# Patient Record
Sex: Female | Born: 1955 | Race: White | Hispanic: No | Marital: Married | Smoking: Never smoker
Health system: Southern US, Community
[De-identification: ages and names within clinical notes are randomized; demographics above are authoritative.]

## PROBLEM LIST (undated history)

## (undated) DIAGNOSIS — Z9989 Dependence on other enabling machines and devices: Secondary | ICD-10-CM

## (undated) DIAGNOSIS — F419 Anxiety disorder, unspecified: Secondary | ICD-10-CM

## (undated) DIAGNOSIS — M109 Gout, unspecified: Secondary | ICD-10-CM

## (undated) DIAGNOSIS — E781 Pure hyperglyceridemia: Secondary | ICD-10-CM

## (undated) DIAGNOSIS — K635 Polyp of colon: Secondary | ICD-10-CM

## (undated) DIAGNOSIS — M7501 Adhesive capsulitis of right shoulder: Secondary | ICD-10-CM

## (undated) DIAGNOSIS — E119 Type 2 diabetes mellitus without complications: Secondary | ICD-10-CM

## (undated) DIAGNOSIS — G4733 Obstructive sleep apnea (adult) (pediatric): Secondary | ICD-10-CM

## (undated) DIAGNOSIS — D649 Anemia, unspecified: Secondary | ICD-10-CM

## (undated) HISTORY — PX: EYE SURGERY: SHX253

## (undated) HISTORY — DX: Pure hyperglyceridemia: E78.1

## (undated) HISTORY — DX: Gout, unspecified: M10.9

## (undated) HISTORY — PX: BREAST SURGERY: SHX581

## (undated) HISTORY — DX: Polyp of colon: K63.5

## (undated) HISTORY — PX: ACHILLES TENDON SURGERY: SHX542

## (undated) HISTORY — DX: Anxiety disorder, unspecified: F41.9

## (undated) HISTORY — DX: Dependence on other enabling machines and devices: Z99.89

## (undated) HISTORY — DX: Anemia, unspecified: D64.9

## (undated) HISTORY — PX: ABDOMINAL HYSTERECTOMY: SHX81

## (undated) HISTORY — DX: Type 2 diabetes mellitus without complications: E11.9

## (undated) HISTORY — DX: Obstructive sleep apnea (adult) (pediatric): G47.33

---

## 1997-02-09 HISTORY — PX: OTHER SURGICAL HISTORY: SHX169

## 1997-02-09 HISTORY — PX: OVARY SURGERY: SHX727

## 1997-11-26 ENCOUNTER — Ambulatory Visit (HOSPITAL_COMMUNITY): Admission: RE | Admit: 1997-11-26 | Discharge: 1997-11-26 | Payer: Self-pay | Admitting: *Deleted

## 1997-11-29 ENCOUNTER — Ambulatory Visit (HOSPITAL_COMMUNITY): Admission: RE | Admit: 1997-11-29 | Discharge: 1997-11-29 | Payer: Self-pay | Admitting: *Deleted

## 1997-12-17 ENCOUNTER — Ambulatory Visit (HOSPITAL_COMMUNITY): Admission: RE | Admit: 1997-12-17 | Discharge: 1997-12-17 | Payer: Self-pay | Admitting: *Deleted

## 1998-02-28 ENCOUNTER — Ambulatory Visit (HOSPITAL_COMMUNITY): Admission: RE | Admit: 1998-02-28 | Discharge: 1998-02-28 | Payer: Self-pay | Admitting: Neurosurgery

## 1998-02-28 ENCOUNTER — Encounter: Payer: Self-pay | Admitting: Neurosurgery

## 1998-03-12 ENCOUNTER — Encounter: Payer: Self-pay | Admitting: Neurosurgery

## 1998-03-12 ENCOUNTER — Ambulatory Visit (HOSPITAL_COMMUNITY): Admission: RE | Admit: 1998-03-12 | Discharge: 1998-03-12 | Payer: Self-pay | Admitting: Neurosurgery

## 1998-10-03 ENCOUNTER — Other Ambulatory Visit: Admission: RE | Admit: 1998-10-03 | Discharge: 1998-10-03 | Payer: Self-pay | Admitting: Obstetrics and Gynecology

## 1998-12-03 ENCOUNTER — Ambulatory Visit (HOSPITAL_COMMUNITY): Admission: RE | Admit: 1998-12-03 | Discharge: 1998-12-03 | Payer: Self-pay | Admitting: *Deleted

## 1999-03-20 ENCOUNTER — Ambulatory Visit (HOSPITAL_COMMUNITY): Admission: RE | Admit: 1999-03-20 | Discharge: 1999-03-20 | Payer: Self-pay | Admitting: Neurosurgery

## 1999-03-20 ENCOUNTER — Encounter: Payer: Self-pay | Admitting: Neurosurgery

## 1999-05-26 ENCOUNTER — Inpatient Hospital Stay (HOSPITAL_COMMUNITY): Admission: EM | Admit: 1999-05-26 | Discharge: 1999-05-27 | Payer: Self-pay | Admitting: Emergency Medicine

## 1999-05-26 ENCOUNTER — Encounter: Payer: Self-pay | Admitting: Emergency Medicine

## 1999-10-10 ENCOUNTER — Encounter: Payer: Self-pay | Admitting: Internal Medicine

## 1999-10-10 ENCOUNTER — Ambulatory Visit (HOSPITAL_COMMUNITY): Admission: RE | Admit: 1999-10-10 | Discharge: 1999-10-10 | Payer: Self-pay | Admitting: Internal Medicine

## 1999-12-04 ENCOUNTER — Ambulatory Visit (HOSPITAL_COMMUNITY): Admission: RE | Admit: 1999-12-04 | Discharge: 1999-12-04 | Payer: Self-pay | Admitting: *Deleted

## 1999-12-18 ENCOUNTER — Other Ambulatory Visit: Admission: RE | Admit: 1999-12-18 | Discharge: 1999-12-18 | Payer: Self-pay | Admitting: Obstetrics and Gynecology

## 2000-11-01 ENCOUNTER — Other Ambulatory Visit: Admission: RE | Admit: 2000-11-01 | Discharge: 2000-11-01 | Payer: Self-pay | Admitting: Internal Medicine

## 2000-12-07 ENCOUNTER — Encounter: Payer: Self-pay | Admitting: Internal Medicine

## 2000-12-07 ENCOUNTER — Ambulatory Visit (HOSPITAL_COMMUNITY): Admission: RE | Admit: 2000-12-07 | Discharge: 2000-12-07 | Payer: Self-pay | Admitting: Internal Medicine

## 2000-12-09 ENCOUNTER — Encounter: Payer: Self-pay | Admitting: Internal Medicine

## 2000-12-09 ENCOUNTER — Encounter: Admission: RE | Admit: 2000-12-09 | Discharge: 2000-12-09 | Payer: Self-pay | Admitting: Internal Medicine

## 2001-11-25 ENCOUNTER — Encounter: Payer: Self-pay | Admitting: Internal Medicine

## 2001-11-25 ENCOUNTER — Ambulatory Visit (HOSPITAL_COMMUNITY): Admission: RE | Admit: 2001-11-25 | Discharge: 2001-11-25 | Payer: Self-pay | Admitting: Internal Medicine

## 2004-06-02 ENCOUNTER — Ambulatory Visit: Payer: Self-pay

## 2006-10-12 ENCOUNTER — Ambulatory Visit (HOSPITAL_BASED_OUTPATIENT_CLINIC_OR_DEPARTMENT_OTHER): Admission: RE | Admit: 2006-10-12 | Discharge: 2006-10-12 | Payer: Self-pay | Admitting: Orthopedic Surgery

## 2007-02-10 HISTORY — PX: OTHER SURGICAL HISTORY: SHX169

## 2007-11-16 ENCOUNTER — Ambulatory Visit (HOSPITAL_COMMUNITY): Admission: RE | Admit: 2007-11-16 | Discharge: 2007-11-16 | Payer: Self-pay | Admitting: Internal Medicine

## 2008-01-23 ENCOUNTER — Other Ambulatory Visit: Admission: RE | Admit: 2008-01-23 | Discharge: 2008-01-23 | Payer: Self-pay | Admitting: Internal Medicine

## 2009-02-05 ENCOUNTER — Ambulatory Visit (HOSPITAL_COMMUNITY): Admission: RE | Admit: 2009-02-05 | Discharge: 2009-02-05 | Payer: Self-pay | Admitting: Internal Medicine

## 2010-06-24 NOTE — Op Note (Signed)
NAME:  DEETTE, REVAK             ACCOUNT NO.:  [id number]   MEDICAL RECORD NO.:  [id number]          PATIENT TYPE:  AMB   LOCATION:  DSC                          FACILITY:  [facility]   PHYSICIAN:  Leonides Grills, M.D.     DATE OF BIRTH:  December 09, 1955   DATE OF PROCEDURE:  10/12/2006  DATE OF DISCHARGE:                               OPERATIVE REPORT   PREOPERATIVE DIAGNOSES:  1. Right calcific Achilles tendinopathy.  2. Right Haglund deformity.  3. Tight gastroc.   POSTOPERATIVE DIAGNOSES:  1. Right calcific Achilles tendinopathy.  2. Right Haglund deformity.  3. Tight gastroc.   OPERATION:  1. Excision of Haglund's deformity.  2. Primary repair of Achilles tendon.  3. Right gastroc slide.   ANESTHESIA:  General.   SURGEON:  Leonides Grills, M.D.   ASSISTANT:  Evlyn Kanner, P.A.   ESTIMATED BLOOD LOSS:  Minimal.   TOURNIQUET TIME:  Approximately 1 hour 20 minutes.   COMPLICATIONS:  None.   DISPOSITION:  Stable to PR.   INDICATIONS:  This is a 55 year old female who had longstanding right  posterior heel pain that was interfering with her life.  __________  despite conservative management.  She was consented for the above  procedure.  All risks, which include infection, neurovascular injury,  persistent pain, worse pain, prolonged recovery, weakness, Achilles  tendon rupture were all explained.  Questions were encouraged and  answered.   OPERATION:  The patient was brought to the operating room and placed in  supine position.  After adequate general endotracheal tube anesthesia  was administered as well as Ancef gram IV piggyback, the patient was  then placed in a prone position with all bony prominences well padded.  Bolsters were placed.  Bilateral extremities were prepped and draped in  a sterile manner and a proximally placed thigh tourniquet.  We started  the procedure with a longitudinal incision at the medial aspect  gastrocnemius muscle tendinous junction.   Dissection was carried down  through skin.  Hemostasis was obtained.  The fascia was opened in line  of the incision.  Conjoint region was developed between the gastroc  soleus.  Soft tissue was then elevated off the posterior aspect of  gastrocnemius.  The sural nerve was then identified and protected  posteriorly.  The gastrocnemius was then released with a curved Mayo  scissors.  The __________ was released tight gastroc.  The area was  copiously irrigated with the normal saline.  The subcutaneous was closed  with 3-0 Vicryl.  The skin was closed with 4-0 Monocryl subcuticular  stitch.  Steri-Strips were applied.  The limb was then gravity  exsanguinated, and the tourniquet was elevated to 290 mmHg.  __________  incision on the anteromedial, anterolateral aspect of the Achilles  tendon was then made.  Dissection was carried directly to bone.  Achilles tendon was elevated off the calcaneal tuberous respectively.  The Haglund's  deformity as well as the calcification within the  Achilles tendon was carefully dissected out.  Then, with a sagittal saw,  a curved 1/4-inch osteotome and rongeur, the bone was then removed.  This  was then verified under C-arm guidance to be adequately  decompressed.  We then repaired the Achilles tendon down to bone using  two 5.5 mm bioabsorbable suture anchors with #2 FiberWire.  We placed  two, one on either side, just proximal to the insertion of the Achilles  tendon.  This was then repaired with a #2 FiberWire stitch.  We then  performed a crisscross technique with a #2 FiberWire, that was applied  to rotator cuff repairs, to the Achilles tendon and repaired this final  portion with a no knot technique with a push-lock suture anchor.  This  had an Conservation officer, historic buildings.  The medial and lateral aspects of the  Achilles tendon were repaired with 2-0 Vicryl.  The area was copiously  irrigated with normal saline.  The tourniquet was deflated and  hemostasis was  obtained.  The skin was closed with 4-0 nylon stitch.  Sterile dressing was applied.  Modified Jones dressing was applied to  the ankle and then with the ankle in gravity __________ . The patient  was stable to the PR.      Leonides Grills, M.D.  Electronically Signed     PB/MEDQ  D:  10/12/2006  T:  10/12/2006  Job:  [id number]

## 2010-06-27 NOTE — Discharge Summary (Signed)
. [facility]  Patient:    Amanda Cooper, Amanda Cooper                    MRN: [id number] Adm. Date:  [id number] Disc. Date: [id number] Attending:  Virgina Evener Dictator:   Marya Fossa, P.A. CC:         Nicky Pugh. Lenon Ahmadi, M.D.             Lennette Bihari, M.D.                           Discharge Summary  DATE OF BIRTH:  10/17/1955  ADMISSION DIAGNOSES:  1. Chest pain, rule out myocardial infarction.  2. Hormone replacement therapy.  DISCHARGE DIAGNOSES:  1. Chest pain, resolved, myocardial infarction ruled out with negative enzymes and     negative electrocardiogram with the exception of a T wave inversion in lead     III.  2. Hormone replacement therapy.  HISTORY OF PRESENT ILLNESS:  This is a 55 year old white female with no prior cardiac history except that she has had a nuclear stress test in 1993 which was  normal.  She has had chest pain on and off for one week and had very light discomfort. t has progressively worsened becoming more significant yesterday and the date of admission and has become constant today.  Overnight last night the pain increased in intensity and she was unable to sleep and was awoken at 5:30 this morning with chest pain.   Nothing seems to make the pain better or worse and she has noticed no association with exertion.  The chest pain was dull in nature and is now more stabbing in the emergency room over the left breast and left shoulder blade and down the left arm.  She has some mild shortness of breath, but no diaphoresis, nausea, or vomiting.  The patients chest pain is somewhat worrisome for angina, and we will admit her  the MI rule out unit.  Check serial cardiac enzymes and EKG and will plan to discharge the patient to home if she remains stable with an outpatient stress test.  PROCEDURES:  None.  COMPLICATIONS:  None.  CONSULTATIONS:  None.  COURSE IN THE HOSPITAL:  The patient was admitted  and placed on the MI rule out  unit.  Cardiac enzymes were negative x 3.  Other laboratory studies were within  normal limits.  The patient had no further chest pain overnight.  The repeat EKG was performed n May 27, 1999.  It showed normal sinus rhythm with a T wave inversion in lead , but otherwise no acute abnormality.  The patient was deemed stable for discharge to home by Dr. Nicki Guadalajara.  We will have her take Toprol XL 25 mg a day and Celebrex as before as this may be musculoskeletal in nature.  We would recommend she take an aspirin a day.  The patient will be able to perform activity as tolerated and follow a low fat, low cholesterol diet.  She should call the office for any problems or questions. An exercise stress Cardiolite is scheduled for Jun 10, 1999 at 11 in the morning. r. Tresa Endo will see the patient back on May 8 at 9:40 in the morning. DD:  05/27/99 TD:  05/27/99 Job: [id number]

## 2010-07-28 ENCOUNTER — Other Ambulatory Visit (HOSPITAL_COMMUNITY): Payer: Self-pay | Admitting: Internal Medicine

## 2010-07-28 ENCOUNTER — Ambulatory Visit (HOSPITAL_COMMUNITY)
Admission: RE | Admit: 2010-07-28 | Discharge: 2010-07-28 | Disposition: A | Discharge status: Home / Self Care | Payer: 59 | Source: Ambulatory Visit | Attending: Internal Medicine | Admitting: Internal Medicine

## 2010-07-28 DIAGNOSIS — R079 Chest pain, unspecified: Secondary | ICD-10-CM | POA: Insufficient documentation

## 2010-07-28 DIAGNOSIS — R0989 Other specified symptoms and signs involving the circulatory and respiratory systems: Secondary | ICD-10-CM | POA: Insufficient documentation

## 2010-07-28 DIAGNOSIS — E119 Type 2 diabetes mellitus without complications: Secondary | ICD-10-CM | POA: Insufficient documentation

## 2010-11-21 LAB — BASIC METABOLIC PANEL
CO2: 29
Chloride: 105
GFR calc Af Amer: >60
Sodium: 139

## 2011-09-02 ENCOUNTER — Other Ambulatory Visit (HOSPITAL_COMMUNITY): Payer: Self-pay | Admitting: Internal Medicine

## 2011-09-02 ENCOUNTER — Ambulatory Visit (HOSPITAL_COMMUNITY)
Admission: RE | Admit: 2011-09-02 | Discharge: 2011-09-02 | Disposition: A | Discharge status: Home / Self Care | Payer: 59 | Source: Ambulatory Visit | Attending: Internal Medicine | Admitting: Internal Medicine

## 2011-09-02 DIAGNOSIS — M25569 Pain in unspecified knee: Secondary | ICD-10-CM

## 2012-11-16 ENCOUNTER — Other Ambulatory Visit: Payer: Self-pay | Admitting: Internal Medicine

## 2012-11-16 DIAGNOSIS — R519 Headache, unspecified: Secondary | ICD-10-CM

## 2012-11-24 ENCOUNTER — Ambulatory Visit
Admission: RE | Admit: 2012-11-24 | Discharge: 2012-11-24 | Disposition: A | Discharge status: Home / Self Care | Payer: 59 | Source: Ambulatory Visit | Attending: Internal Medicine | Admitting: Internal Medicine

## 2012-11-24 DIAGNOSIS — R519 Headache, unspecified: Secondary | ICD-10-CM

## 2012-11-24 MED ORDER — GADOBENATE DIMEGLUMINE 529 MG/ML IV SOLN
19.0000 mL | Freq: Once | INTRAVENOUS | Status: AC | PRN
  Administered 2012-11-24: 19 mL via INTRAVENOUS

## 2013-02-08 ENCOUNTER — Other Ambulatory Visit: Payer: Self-pay | Admitting: Internal Medicine

## 2013-02-20 ENCOUNTER — Other Ambulatory Visit: Payer: Self-pay | Admitting: Emergency Medicine

## 2013-02-20 ENCOUNTER — Encounter: Payer: Self-pay | Admitting: *Deleted

## 2013-02-20 DIAGNOSIS — E1122 Type 2 diabetes mellitus with diabetic chronic kidney disease: Secondary | ICD-10-CM | POA: Insufficient documentation

## 2013-02-20 DIAGNOSIS — E1169 Type 2 diabetes mellitus with other specified complication: Secondary | ICD-10-CM | POA: Insufficient documentation

## 2013-02-20 DIAGNOSIS — E785 Hyperlipidemia, unspecified: Secondary | ICD-10-CM

## 2013-02-20 DIAGNOSIS — N182 Chronic kidney disease, stage 2 (mild): Secondary | ICD-10-CM | POA: Insufficient documentation

## 2013-02-20 DIAGNOSIS — F324 Major depressive disorder, single episode, in partial remission: Secondary | ICD-10-CM | POA: Insufficient documentation

## 2013-02-20 DIAGNOSIS — F419 Anxiety disorder, unspecified: Secondary | ICD-10-CM | POA: Insufficient documentation

## 2013-02-20 MED ORDER — METFORMIN HCL 1000 MG PO TABS: 1000.0000 mg | ORAL_TABLET | Freq: Two times a day (BID) | ORAL | Status: DC

## 2013-02-22 ENCOUNTER — Encounter: Payer: Self-pay | Admitting: Emergency Medicine

## 2013-02-22 ENCOUNTER — Other Ambulatory Visit: Payer: Self-pay | Admitting: Emergency Medicine

## 2013-02-22 ENCOUNTER — Ambulatory Visit (INDEPENDENT_AMBULATORY_CARE_PROVIDER_SITE_OTHER): Payer: 59 | Admitting: Emergency Medicine

## 2013-02-22 VITALS — BP 134/78 | HR 78 | Temp 98.0°F | Resp 18 | Ht 63.5 in | Wt 187.0 lb

## 2013-02-22 DIAGNOSIS — R030 Elevated blood-pressure reading, without diagnosis of hypertension: Secondary | ICD-10-CM

## 2013-02-22 DIAGNOSIS — Z1212 Encounter for screening for malignant neoplasm of rectum: Secondary | ICD-10-CM

## 2013-02-22 DIAGNOSIS — Z111 Encounter for screening for respiratory tuberculosis: Secondary | ICD-10-CM

## 2013-02-22 DIAGNOSIS — K635 Polyp of colon: Secondary | ICD-10-CM | POA: Insufficient documentation

## 2013-02-22 DIAGNOSIS — R51 Headache: Secondary | ICD-10-CM

## 2013-02-22 DIAGNOSIS — E119 Type 2 diabetes mellitus without complications: Secondary | ICD-10-CM

## 2013-02-22 DIAGNOSIS — Z0000 Encounter for general adult medical examination without abnormal findings: Secondary | ICD-10-CM

## 2013-02-22 DIAGNOSIS — E782 Mixed hyperlipidemia: Secondary | ICD-10-CM

## 2013-02-22 DIAGNOSIS — Z86010 Personal history of colonic polyps: Secondary | ICD-10-CM | POA: Insufficient documentation

## 2013-02-22 DIAGNOSIS — E559 Vitamin D deficiency, unspecified: Secondary | ICD-10-CM

## 2013-02-22 LAB — CBC WITH DIFFERENTIAL/PLATELET
Basophils Absolute: 0.1 10*3/uL (ref 0.0–0.1)
Basophils Relative: 1 % (ref 0–1)
EOS ABS: 0.3 10*3/uL (ref 0.0–0.7)
Eosinophils Relative: 5 % (ref 0–5)
HEMATOCRIT: 42.8 % (ref 36.0–46.0)
HEMOGLOBIN: 14.5 g/dL (ref 12.0–15.0)
LYMPHS ABS: 2.4 10*3/uL (ref 0.7–4.0)
Lymphocytes Relative: 33 % (ref 12–46)
MCH: 30.4 pg (ref 26.0–34.0)
MCHC: 33.9 g/dL (ref 30.0–36.0)
MCV: 89.7 fL (ref 78.0–100.0)
MONO ABS: 0.4 10*3/uL (ref 0.1–1.0)
MONOS PCT: 5 % (ref 3–12)
NEUTROS ABS: 4 10*3/uL (ref 1.7–7.7)
NEUTROS PCT: 56 % (ref 43–77)
Platelets: 254 10*3/uL (ref 150–400)
RBC: 4.77 MIL/uL (ref 3.87–5.11)
RDW: 14.8 % (ref 11.5–15.5)
WBC: 7.2 10*3/uL (ref 4.0–10.5)

## 2013-02-22 LAB — LIPID PANEL
CHOL/HDL RATIO: 3.4 ratio
CHOLESTEROL: 146 mg/dL (ref 0–200)
HDL: 43 mg/dL (ref >39)
LDL Cholesterol: 65 mg/dL (ref 0–99)
TRIGLYCERIDES: 191 mg/dL — AB (ref <150)
VLDL: 38 mg/dL (ref 0–40)

## 2013-02-22 LAB — BASIC METABOLIC PANEL WITH GFR
BUN: 13 mg/dL (ref 6–23)
CALCIUM: 10.3 mg/dL (ref 8.4–10.5)
CO2: 29 meq/L (ref 19–32)
Chloride: 99 mEq/L (ref 96–112)
Creat: 0.56 mg/dL (ref 0.50–1.10)
GFR, Est Non African American: >89 mL/min
GLUCOSE: 99 mg/dL (ref 70–99)
Potassium: 4.1 mEq/L (ref 3.5–5.3)
SODIUM: 139 meq/L (ref 135–145)

## 2013-02-22 LAB — HEPATIC FUNCTION PANEL
ALBUMIN: 4.6 g/dL (ref 3.5–5.2)
ALK PHOS: 92 U/L (ref 39–117)
ALT: 29 U/L (ref 0–35)
AST: 29 U/L (ref 0–37)
BILIRUBIN TOTAL: 0.5 mg/dL (ref 0.3–1.2)
Bilirubin, Direct: 0.1 mg/dL (ref 0.0–0.3)
Indirect Bilirubin: 0.4 mg/dL (ref 0.0–0.9)
TOTAL PROTEIN: 7.1 g/dL (ref 6.0–8.3)

## 2013-02-22 LAB — MAGNESIUM: Magnesium: 1.9 mg/dL (ref 1.5–2.5)

## 2013-02-22 MED ORDER — PHENTERMINE HCL 37.5 MG PO CAPS: 37.5000 mg | ORAL_CAPSULE | ORAL | Status: DC

## 2013-02-22 MED ORDER — MELOXICAM 15 MG PO TABS: 15.0000 mg | ORAL_TABLET | Freq: Every day | ORAL | Status: DC

## 2013-02-22 NOTE — Patient Instructions (Addendum)
Tuberculin Skin Test The PPD skin test is a method used to help with the diagnosis of a disease called tuberculosis (TB). HOW THE TEST IS DONE  The test site (usually the forearm) is cleansed. The PPD extract is then injected under the top layer of skin, causing a blister to form on the skin. The reaction will take 48 - 72 hours to develop. You must return to your health care provider within that time to have the area checked. This will determine whether you have had a significant reaction to the PPD test. A reaction is measured in millimeters of hard swelling (induration) at the site. PREPARATION FOR TEST  There is no special preparation for this test. People with a skin rash or other skin irritations on their arms may need to have the test performed at a different spot on the body. Tell your health care provider if you have ever had a positive PPD skin test. If so, you should not have a repeat PPD test. Tell your doctor if you have a medical condition or if you take certain drugs, such as steroids, that can affect your immune system. These situations may lead to inaccurate test results. NORMAL FINDINGS A negative reaction (no induration) or a level of hard swelling that falls below a certain cutoff may mean that a person has not been infected with the bacteria that cause TB. There are different cutoffs for children, people with HIV, and other risk groups. Unfortunately, this is not a perfect test, and up to 20% of people infected with tuberculosis may not have a reaction on the PPD skin test. In addition, certain conditions that affect the immune system (cancer, recent chemotherapy, late-stage AIDS) may cause a false-negative test result.  The reaction will take 48 - 72 hours to develop. You must return to your health care provider within that time to have the area checked. Follow your caregiver's instructions as to where and when to report for this to be done. Ranges for normal findings may vary  among different laboratories and hospitals. You should always check with your doctor after having lab work or other tests done to discuss the meaning of your test results and whether your values are considered within normal limits. WHAT ABNORMAL RESULTS MEAN  The results of the test depend on the size of the skin reaction and on the person being tested.  A small reaction (5 mm of hard swelling at the site) is considered to be positive in people who have HIV, who are taking steroid therapy, or who have been in close contact with a person who has active tuberculosis. Larger reactions (greater than or equal to 10 mm) are considered positive in people with diabetes or kidney failure, and in health care workers, among others. In people with no known risks for tuberculosis, a positive reaction requires 15 mm or more of hard swelling at the site. RISKS AND COMPLICATIONS There is a very small risk of severe redness and swelling of the arm in people who have had a previous positive PPD test and who have the test again. There also have been a few rare cases of this reaction in people who have not been tested before. CONSIDERATIONS  A positive skin test does not necessarily mean that a person has active tuberculosis. More tests will be done to check whether active disease is present. Many people who were born outside the [place] may have had a vaccine called "BCG," which can lead to a false-positive test   result. MEANING OF TEST  Your caregiver will go over the test results with you and discuss the importance and meaning of your results, as well as treatment options and the need for additional tests if necessary. OBTAINING THE TEST RESULTS It is your responsibility to obtain your test results. Ask the lab or department performing the test when and how you will get your results. Document Released: 11/05/2004 Document Revised: 04/20/2011 Document Reviewed: 01/08/2008 [facility] Patient Information 2014  [place], [place]. Migraine Headache A migraine headache is very bad, throbbing pain on one or both sides of your head. Talk to your doctor about what things may bring on (trigger) your migraine headaches. HOME CARE  Only take medicines as told by your doctor.  Lie down in a dark, quiet room when you have a migraine.  Keep a journal to find out if certain things bring on migraine headaches. For example, write down:  What you eat and drink.  How much sleep you get.  Any change to your diet or medicines.  Lessen how much alcohol you drink.  Quit smoking if you smoke.  Get enough sleep.  Lessen any stress in your life.  Keep lights dim if bright lights bother you or make your migraines worse. GET HELP RIGHT AWAY IF:   Your migraine becomes really bad.  You have a fever.  You have a stiff neck.  You have trouble seeing.  Your muscles are weak, or you lose muscle control.  You lose your balance or have trouble walking.  You feel like you will pass out (faint), or you pass out.  You have really bad symptoms that are different than your first symptoms. MAKE SURE YOU:   Understand these instructions.  Will watch your condition.  Will get help right away if you are not doing well or get worse. Document Released: 11/05/2007 Document Revised: 04/20/2011 Document Reviewed: 10/03/2012 [facility] Patient Information 2014 [place].

## 2013-02-22 NOTE — Progress Notes (Signed)
Subjective:    Patient ID: Amanda Cooper, female    DOB: 1955-04-30, 58 y.o.   MRN: [id number]  HPI Comments: 58 YO WF CPE AND presents for 3 month F/U for HTN, Cholesterol, DM, D. Deficient. Her BS has been better at home with [place] but will need to switch to [place] with insurance coverage. She has lost 20# with 1/2 pill of Phentermine RX QD x 2 months. She is feeling well overall. She has not been checking her BP at home. She keeps busy but denies cardio. She is trying to eat healthier. LAST LABS A1C 8.1 T 126 TG 230 H 34 L 46 D 65 MAG 1.7  Neg MRI and ENT evaluation of right ear pain that radiates deep into head. She notes pain several times a week and aleve helps to resolve SYMPTOMS. SHE DENIES ANY TRIGGERS.  She denies any recent gout flares.   Current Outpatient Prescriptions on File Prior to Visit  Medication Sig Dispense Refill  . allopurinol (ZYLOPRIM) 300 MG tablet Take 300 mg by mouth daily.      Marland Kitchen aspirin 81 MG tablet Take 81 mg by mouth daily.      . Calcium Carbonate-Vit D-Min (CALCIUM 1200 PO) Take by mouth daily.      . Cholecalciferol (VITAMIN D) 2000 UNITS tablet Take 2,000 Units by mouth 2 (two) times daily.      . Dapagliflozin Propanediol (FARXIGA) 10 MG TABS Take by mouth daily.      Marland Kitchen glimepiride (AMARYL) 4 MG tablet Take 4 mg by mouth 2 (two) times daily.      . Magnesium 250 MG TABS Take by mouth daily.      . metFORMIN (GLUCOPHAGE) 1000 MG tablet Take 1 tablet (1,000 mg total) by mouth 2 (two) times daily with a meal.  180 tablet  0  . Multiple Vitamin (MULTIVITAMIN) capsule Take 1 capsule by mouth daily.      . Omega-3 Fatty Acids (FISH OIL) 1000 MG CAPS Take by mouth daily.      . vitamin B-12 (CYANOCOBALAMIN) 1000 MCG tablet Take 1,000 mcg by mouth daily.       No current facility-administered medications on file prior to visit.   Allergies Clinoril and Erythromycin  Past Medical History  Diagnosis Date  . Hyperlipidemia   . Colon polyps   .  Diabetes mellitus without complication   . Anemia   . Anxiety   . Depression   . OSA (obstructive sleep apnea)     Past Surgical History  Procedure Laterality Date  . Abdominal hysterectomy    . Breast surgery Left     asp cyst  . Ovary surgery Left 1999   History  Substance Use Topics  . Smoking status: Never Smoker   . Smokeless tobacco: Not on file  . Alcohol Use: No   Family History  Problem Relation Age of Onset  . Heart disease Father   . Alcohol abuse Father   . Heart attack Father   . Diabetes Sister   . COPD Mother   . Depression Mother       Review of Systems  HENT: Positive for ear pain.   Eyes:       DIGBY 2014 WNL  Gastrointestinal:       MEDOFF PRN  Genitourinary:       TAAVON WNL 2013 PAP, MAMMO- SOLIS WNL 12/12/12 1ST PREG @19  1ST MENSES @12   Musculoskeletal:       KUZMA PRN SCHWARTZ-CHIRO/  PRN  Skin:       TAFEEN PRN  All other systems reviewed and are negative.   BP 134/78  Pulse 78  Temp(Src) 98 F (36.7 C) (Temporal)  Resp 18  Ht 5' 3.5" (1.613 m)  Wt 187 lb (84.823 kg)  BMI 32.60 kg/m2     Objective:   Physical Exam  Nursing note and vitals reviewed. Constitutional: She is oriented to person, place, and time. She appears well-developed and well-nourished. No distress.  Over weight  HENT:  Head: Normocephalic and atraumatic.  Right Ear: External ear normal.  Left Ear: External ear normal.  Nose: Nose normal.  Mouth/Throat: Oropharynx is clear and moist. No oropharyngeal exudate.  NO OBVIOUS TMJ click + cloudy TMs  Eyes: Conjunctivae and EOM are normal. Pupils are equal, round, and reactive to light. Right eye exhibits no discharge. Left eye exhibits no discharge. No scleral icterus.  Neck: Normal range of motion. Neck supple. No JVD present. No tracheal deviation present. No thyromegaly present.  Cardiovascular: Normal rate, regular rhythm, normal heart sounds and intact distal pulses.   Pulmonary/Chest: Effort normal and  breath sounds normal.  Abdominal: Soft. Bowel sounds are normal. She exhibits no distension and no mass. There is no tenderness. There is no rebound and no guarding.  Genitourinary:  Breast WNL, Rest def 2016 taavon  Musculoskeletal: Normal range of motion. She exhibits no edema and no tenderness.  Lymphadenopathy:    She has no cervical adenopathy.  Neurological: She is alert and oriented to person, place, and time. She has normal reflexes. No cranial nerve deficit. She exhibits normal muscle tone. Coordination normal.  Skin: Skin is warm and dry. No rash noted. No erythema. No pallor.  Psychiatric: She has a normal mood and affect. Her behavior is normal. Judgment and thought content normal.      EKG NSCSPT/ NO CV COMPLAINTS    Assessment & Plan:  1. CPE AND 1.  3 month F/U for labile HTN, Cholesterol,DM, D. Deficient. Needs healthy diet, cardio QD and obtain healthy weight. Check Labs, Check BP if >130/80 call office, Check BS if >200 call office. Will switch [place] to [place] with insurance coverage change. SX 300 mg given will consider combo once has been on SX x 1 week. Pt w/c if desires combo 2. Headache vs TMJ- Hygiene explained, has apt scheduled with dentist soon and will address 3. Overweight- If BP stays >130/80 d/c Phentermine. Otherwise continue 1/2 qd. 4. Allergic rhinitis- Allegra OTC, increase H2o, allergy hygiene explained.

## 2013-02-23 LAB — URINALYSIS, ROUTINE W REFLEX MICROSCOPIC
Bilirubin Urine: NEGATIVE
Glucose, UA: >1000 mg/dL — AB
HGB URINE DIPSTICK: NEGATIVE
Leukocytes, UA: NEGATIVE
NITRITE: NEGATIVE
Protein, ur: NEGATIVE mg/dL
Specific Gravity, Urine: 1.025 (ref 1.005–1.030)
UROBILINOGEN UA: 0.2 mg/dL (ref 0.0–1.0)
pH: 6 (ref 5.0–8.0)

## 2013-02-23 LAB — URINALYSIS, MICROSCOPIC ONLY
BACTERIA UA: NONE SEEN
CASTS: NONE SEEN
Crystals: NONE SEEN
Squamous Epithelial / LPF: NONE SEEN

## 2013-02-23 LAB — URIC ACID: URIC ACID, SERUM: 4 mg/dL (ref 2.4–7.0)

## 2013-02-23 LAB — MICROALBUMIN / CREATININE URINE RATIO
Creatinine, Urine: 67.2 mg/dL
Microalb Creat Ratio: 14 mg/g (ref 0.0–30.0)
Microalb, Ur: 0.94 mg/dL (ref 0.00–1.89)

## 2013-02-23 LAB — VITAMIN D 25 HYDROXY (VIT D DEFICIENCY, FRACTURES): VIT D 25 HYDROXY: 80 ng/mL (ref 30–89)

## 2013-02-23 LAB — HEMOGLOBIN A1C
HEMOGLOBIN A1C: 7.9 % — AB (ref <5.7)
Mean Plasma Glucose: 180 mg/dL — ABNORMAL HIGH (ref <117)

## 2013-02-23 LAB — INSULIN, FASTING: Insulin fasting, serum: 17 u[IU]/mL (ref 3–28)

## 2013-02-23 LAB — TSH: TSH: 0.783 u[IU]/mL (ref 0.350–4.500)

## 2013-02-23 MED ORDER — MELOXICAM 15 MG PO TABS: 15.0000 mg | ORAL_TABLET | Freq: Every day | ORAL | Status: DC

## 2013-02-23 MED ORDER — GLIMEPIRIDE 4 MG PO TABS: 4.0000 mg | ORAL_TABLET | Freq: Two times a day (BID) | ORAL | Status: DC

## 2013-02-23 MED ORDER — ALLOPURINOL 300 MG PO TABS: 300.0000 mg | ORAL_TABLET | Freq: Every day | ORAL | Status: DC

## 2013-02-24 LAB — TB SKIN TEST
INDURATION: 0 mm
TB SKIN TEST: NEGATIVE

## 2013-03-24 ENCOUNTER — Other Ambulatory Visit: Payer: Self-pay | Admitting: Emergency Medicine

## 2013-03-24 MED ORDER — CANAGLIFLOZIN 300 MG PO TABS: 300.0000 mg | ORAL_TABLET | Freq: Every day | ORAL | Status: DC

## 2013-04-24 ENCOUNTER — Other Ambulatory Visit: Payer: Self-pay | Admitting: *Deleted

## 2013-04-24 MED ORDER — CANAGLIFLOZIN 300 MG PO TABS: 300.0000 mg | ORAL_TABLET | Freq: Every day | ORAL | Status: DC

## 2013-05-16 ENCOUNTER — Other Ambulatory Visit: Payer: Self-pay

## 2013-05-16 ENCOUNTER — Other Ambulatory Visit: Payer: 59

## 2013-05-16 ENCOUNTER — Telehealth: Payer: Self-pay

## 2013-05-16 DIAGNOSIS — N390 Urinary tract infection, site not specified: Secondary | ICD-10-CM

## 2013-05-16 NOTE — Telephone Encounter (Signed)
Pt called wanting to drop off urine speciman. C/o UTI symptoms. Is this ok?

## 2013-05-16 NOTE — Telephone Encounter (Signed)
Yes it is OK for lab only. UA C/s 599.0

## 2013-05-17 LAB — URINE CULTURE
Colony Count: NO GROWTH
ORGANISM ID, BACTERIA: NO GROWTH

## 2013-05-17 LAB — URINALYSIS, ROUTINE W REFLEX MICROSCOPIC
BILIRUBIN URINE: NEGATIVE
Glucose, UA: >1000 mg/dL — AB
HGB URINE DIPSTICK: NEGATIVE
KETONES UR: NEGATIVE mg/dL
Leukocytes, UA: NEGATIVE
NITRITE: NEGATIVE
PH: 5.5 (ref 5.0–8.0)
Protein, ur: NEGATIVE mg/dL
Specific Gravity, Urine: >1.03 — ABNORMAL HIGH (ref 1.005–1.030)
Urobilinogen, UA: 0.2 mg/dL (ref 0.0–1.0)

## 2013-05-17 LAB — URINALYSIS, MICROSCOPIC ONLY
BACTERIA UA: NONE SEEN
Casts: NONE SEEN
Squamous Epithelial / LPF: NONE SEEN

## 2013-06-01 ENCOUNTER — Ambulatory Visit: Payer: Self-pay | Admitting: Physician Assistant

## 2013-06-06 ENCOUNTER — Encounter: Payer: Self-pay | Admitting: Physician Assistant

## 2013-06-06 ENCOUNTER — Ambulatory Visit (INDEPENDENT_AMBULATORY_CARE_PROVIDER_SITE_OTHER): Payer: 59 | Admitting: Physician Assistant

## 2013-06-06 VITALS — BP 120/72 | HR 84 | Temp 97.7°F | Resp 16 | Wt 189.0 lb

## 2013-06-06 DIAGNOSIS — E119 Type 2 diabetes mellitus without complications: Secondary | ICD-10-CM

## 2013-06-06 DIAGNOSIS — E559 Vitamin D deficiency, unspecified: Secondary | ICD-10-CM

## 2013-06-06 DIAGNOSIS — E785 Hyperlipidemia, unspecified: Secondary | ICD-10-CM

## 2013-06-06 DIAGNOSIS — D649 Anemia, unspecified: Secondary | ICD-10-CM

## 2013-06-06 DIAGNOSIS — F32A Depression, unspecified: Secondary | ICD-10-CM

## 2013-06-06 DIAGNOSIS — N3000 Acute cystitis without hematuria: Secondary | ICD-10-CM

## 2013-06-06 DIAGNOSIS — F3289 Other specified depressive episodes: Secondary | ICD-10-CM

## 2013-06-06 DIAGNOSIS — F329 Major depressive disorder, single episode, unspecified: Secondary | ICD-10-CM

## 2013-06-06 DIAGNOSIS — G4733 Obstructive sleep apnea (adult) (pediatric): Secondary | ICD-10-CM

## 2013-06-06 LAB — CBC WITH DIFFERENTIAL/PLATELET
BASOS ABS: 0.1 10*3/uL (ref 0.0–0.1)
Basophils Relative: 1 % (ref 0–1)
Eosinophils Absolute: 0.2 10*3/uL (ref 0.0–0.7)
Eosinophils Relative: 3 % (ref 0–5)
HEMATOCRIT: 42 % (ref 36.0–46.0)
Hemoglobin: 14.2 g/dL (ref 12.0–15.0)
LYMPHS PCT: 28 % (ref 12–46)
Lymphs Abs: 1.8 10*3/uL (ref 0.7–4.0)
MCH: 30.7 pg (ref 26.0–34.0)
MCHC: 33.8 g/dL (ref 30.0–36.0)
MCV: 90.7 fL (ref 78.0–100.0)
MONO ABS: 0.5 10*3/uL (ref 0.1–1.0)
Monocytes Relative: 7 % (ref 3–12)
NEUTROS ABS: 4 10*3/uL (ref 1.7–7.7)
NEUTROS PCT: 61 % (ref 43–77)
Platelets: 207 10*3/uL (ref 150–400)
RBC: 4.63 MIL/uL (ref 3.87–5.11)
RDW: 14.1 % (ref 11.5–15.5)
WBC: 6.5 10*3/uL (ref 4.0–10.5)

## 2013-06-06 LAB — HEMOGLOBIN A1C
Hgb A1c MFr Bld: 6.9 % — ABNORMAL HIGH (ref <5.7)
Mean Plasma Glucose: 151 mg/dL — ABNORMAL HIGH (ref <117)

## 2013-06-06 MED ORDER — CYCLOBENZAPRINE HCL 10 MG PO TABS: ORAL_TABLET | ORAL | Status: DC

## 2013-06-06 NOTE — Progress Notes (Addendum)
HPI 58 y.o. female  presents for 3 month follow up with hypertension, hyperlipidemia, diabetes and vitamin D. Her blood pressure has been controlled at home, today their BP is BP: 120/72 mmHg She does not workout, but she works and keeps her grandkids. She denies chest pain, shortness of breath, dizziness.  She is not on cholesterol medication and denies myalgias. Her cholesterol is at goal. The cholesterol last visit was:   Lab Results  Component Value Date   CHOL 146 02/22/2013   HDL 43 02/22/2013   LDLCALC 65 02/22/2013   TRIG 191* 02/22/2013   CHOLHDL 3.4 02/22/2013   She has been working on diet and exercise for Diabetes, she does not check , and denies nausea, paresthesia of the feet, polydipsia and visual disturbances. Last A1C in the office was:  Lab Results  Component Value Date   HGBA1C 7.9* 02/22/2013   Patient is on Vitamin D supplement.   She had a negative urine but states she still having some pressure at the end of a stream, denies burning. She also has had stress incontinence but recently has had some urgency as well. Denies sensation of something falling out, discharge, incontinence.   Current Medications:  Current Outpatient Prescriptions on File Prior to Visit  Medication Sig Dispense Refill  . allopurinol (ZYLOPRIM) 300 MG tablet Take 1 tablet (300 mg total) by mouth daily.  90 tablet  1  . aspirin 81 MG tablet Take 81 mg by mouth daily.      . Calcium Carbonate-Vit D-Min (CALCIUM 1200 PO) Take by mouth daily.      . Canagliflozin (INVOKANA) 300 MG TABS Take 1 tablet (300 mg total) by mouth daily.  90 tablet  0  . Cholecalciferol (VITAMIN D) 2000 UNITS tablet Take 2,000 Units by mouth 2 (two) times daily.      Marland Kitchen glimepiride (AMARYL) 4 MG tablet Take 1 tablet (4 mg total) by mouth 2 (two) times daily.  180 tablet  2  . Magnesium 250 MG TABS Take by mouth daily.      . metFORMIN (GLUCOPHAGE) 1000 MG tablet Take 1 tablet (1,000 mg total) by mouth 2 (two) times daily with a  meal.  180 tablet  0  . Multiple Vitamin (MULTIVITAMIN) capsule Take 1 capsule by mouth daily.      . Omega-3 Fatty Acids (FISH OIL) 1000 MG CAPS Take by mouth daily.      . vitamin B-12 (CYANOCOBALAMIN) 1000 MCG tablet Take 1,000 mcg by mouth daily.       No current facility-administered medications on file prior to visit.   Medical History:  Past Medical History  Diagnosis Date  . Hyperlipidemia   . Colon polyps   . Diabetes mellitus without complication   . Anemia   . Anxiety   . Depression   . OSA (obstructive sleep apnea)    Allergies:  Allergies  Allergen Reactions  . Clinoril [Sulindac]   . Erythromycin      Review of Systems: [X]  = complains of  [ ]  = denies  General: Fatigue [ ]  Fever [ ]  Chills [ ]  Weakness [ ]   Insomnia [ ]  Eyes: Redness [ ]  Blurred vision [ ]  Diplopia [ ]   ENT: Congestion [ ]  Sinus Pain [ ]  Post Nasal Drip [ ]  Sore Throat [ ]  Earache [ ]   Cardiac: Chest pain/pressure [ ]  SOB [ ]  Orthopnea [ ]   Palpitations [ ]   Paroxysmal nocturnal dyspnea[ ]  Claudication [ ]  Edema [ ]   Pulmonary: Cough [ ]  Wheezing[ ]   SOB [ ]   Snoring [ ]   GI: Nausea [ ]  Vomiting[ ]  Dysphagia[ ]  Heartburn[ ]  Abdominal pain [ ]  Constipation [ ] ; Diarrhea [ ] ; BRBPR [ ]  Melena[ ]  GU: Hematuria[ ]  Dysuria [ ]  Nocturia[ ]  Urgency [ ]   Hesitancy [ ]  Discharge [ ]  Neuro: Headaches[X ] Vertigo[ ]  Paresthesias[ ]  Spasm [ ]  Speech changes [ ]  Incoordination [ ]   Ortho: Arthritis [ ]  Joint pain [ ]  Muscle pain [ ]  Joint swelling [ ]  Back Pain [ ]  Skin:  Rash [ ]   Pruritis [ ]  Change in skin lesion [ ]   Psych: Depression[ ]  Anxiety[ ]  Confusion [ ]  Memory loss [ ]   Heme/Lypmh: Bleeding [ ]  Bruising [ ]  Enlarged lymph nodes [ ]   Endocrine: Visual blurring [ ]  Paresthesia [ ]  Polyuria [ ]  Polydypsea [ ]    Heat/cold intolerance [ ]  Hypoglycemia [ ]   Family history- Review and unchanged Social history- Review and unchanged Physical Exam: BP 120/72  Pulse 84  Temp(Src) 97.7 F (36.5 C)   Resp 16  Wt 189 lb (85.73 kg) Wt Readings from Last 3 Encounters:  06/06/13 189 lb (85.73 kg)  02/22/13 187 lb (84.823 kg)   General Appearance: Well nourished, in no apparent distress. Eyes: PERRLA, EOMs, conjunctiva no swelling or erythema Sinuses: No Frontal/maxillary tenderness ENT/Mouth: Ext aud canals clear, TMs without erythema, bulging. No erythema, swelling, or exudate on post pharynx.  Tonsils not swollen or erythematous. Hearing normal.  Neck: Supple, thyroid normal. + TMJ left side Respiratory: Respiratory effort normal, BS equal bilaterally without rales, rhonchi, wheezing or stridor.  Cardio: RRR with no MRGs. Brisk peripheral pulses without edema.  Abdomen: Soft, + BS.  Non tender, no guarding, rebound, hernias, masses. Lymphatics: Non tender without lymphadenopathy.  Musculoskeletal: Full ROM, 5/5 strength, normal gait.  Skin: Warm, dry without rashes, lesions, ecchymosis.  Neuro: Cranial nerves intact. No cerebellar symptoms. Sensation intact.  Psych: Awake and oriented X 3, normal affect, Insight and Judgment appropriate.   Assessment and Plan:  Hypertension: Continue medication, monitor blood pressure at home. Continue DASH diet. Cholesterol: Continue diet and exercise. Check cholesterol.  Diabetes-Continue diet and exercise. Check A1C Vitamin D Def- check level and continue medications.  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  Dysuria/pressure/OAB- check urine, needs OAB med given samples myribetria and check pelvic next  Continue diet and meds as discussed. Further disposition pending results of labs. Discussed med's effects and SE's.    Vicie Mutters 9:57 AM   + UTI, send in Cipro 500 BID for 7 days and diflucan

## 2013-06-06 NOTE — Patient Instructions (Addendum)
   Bad carbs also include fruit juice, alcohol, and sweet tea. These are empty calories that do not signal to your brain that you are full.   Please remember the good carbs are still carbs which convert into sugar. So please measure them out no more than 1/2-1 cup of rice, oatmeal, pasta, and beans.  Veggies are however free foods! Pile them on.   I like lean protein at every meal such as chicken, turkey, pork chops, cottage cheese, etc. Just do not fry these meats and please center your meal around vegetable, the meats should be a side dish.   No all fruit is created equal. Please see the list below, the fruit at the bottom is higher in sugars than the fruit at the top   What is the TMJ? The temporomandibular (tem-PUH-ro-man-DIB-yoo-ler) joint, or the TMJ, connects the upper and lower jawbones. This joint allows the jaw to open wide and move back and forth when you chew, talk, or yawn.There are also several muscles that help this joint move. There can be muscle tightness and pain in the muscle that can cause several symptoms.  What causes TMJ pain? There are many causes of TMJ pain. Repeated chewing (for example, chewing gum) and clenching your teeth can cause pain in the joint. Some TMJ pain has no obvious cause. What can I do to ease the pain? There are many things you can do to help your pain get better. When you have pain:  Eat soft foods and stay away from chewy foods (for example, taffy) Try to use both sides of your mouth to chew Don't chew gum Don't open your mouth wide (for example, during yawning or singing) Don't bite your cheeks or fingernails Lower your amount of stress and worry Applying a warm, damp washcloth to the joint may help. Over-the-counter pain medicines such as ibuprofen (one brand: Advil) or acetaminophen (one brand: Tylenol) might also help. Do not use these medicines if you are allergic to them or if your doctor told you not to use them. How can I stop the pain  from coming back? When your pain is better, you can do these exercises to make your muscles stronger and to keep the pain from coming back:  Resisted mouth opening: Place your thumb or two fingers under your chin and open your mouth slowly, pushing up lightly on your chin with your thumb. Hold for three to six seconds. Close your mouth slowly. Resisted mouth closing: Place your thumbs under your chin and your two index fingers on the ridge between your mouth and the bottom of your chin. Push down lightly on your chin as you close your mouth. Tongue up: Slowly open and close your mouth while keeping the tongue touching the roof of the mouth. Side-to-side jaw movement: Place an object about one fourth of an inch thick (for example, two tongue depressors) between your front teeth. Slowly move your jaw from side to side. Increase the thickness of the object as the exercise becomes easier Forward jaw movement: Place an object about one fourth of an inch thick between your front teeth and move the bottom jaw forward so that the bottom teeth are in front of the top teeth. Increase the thickness of the object as the exercise becomes easier. These exercises should not be painful. If it hurts to do these exercises, stop doing them and talk to your family doctor.    

## 2013-06-07 LAB — HEPATIC FUNCTION PANEL
ALT: 22 U/L (ref 0–35)
AST: 18 U/L (ref 0–37)
Albumin: 4.1 g/dL (ref 3.5–5.2)
Alkaline Phosphatase: 94 U/L (ref 39–117)
BILIRUBIN INDIRECT: 0.2 mg/dL (ref 0.2–1.2)
BILIRUBIN TOTAL: 0.3 mg/dL (ref 0.2–1.2)
Bilirubin, Direct: 0.1 mg/dL (ref 0.0–0.3)
TOTAL PROTEIN: 6.3 g/dL (ref 6.0–8.3)

## 2013-06-07 LAB — BASIC METABOLIC PANEL WITH GFR
BUN: 13 mg/dL (ref 6–23)
CO2: 25 mEq/L (ref 19–32)
Calcium: 9.3 mg/dL (ref 8.4–10.5)
Chloride: 98 mEq/L (ref 96–112)
Creat: 0.59 mg/dL (ref 0.50–1.10)
GFR, Est African American: >89 mL/min
Glucose, Bld: 128 mg/dL — ABNORMAL HIGH (ref 70–99)
POTASSIUM: 4.2 meq/L (ref 3.5–5.3)
Sodium: 134 mEq/L — ABNORMAL LOW (ref 135–145)

## 2013-06-07 LAB — URINALYSIS, ROUTINE W REFLEX MICROSCOPIC
BILIRUBIN URINE: NEGATIVE
Glucose, UA: >1000 mg/dL — AB
Nitrite: NEGATIVE
PROTEIN: NEGATIVE mg/dL
Specific Gravity, Urine: 1.026 (ref 1.005–1.030)
Urobilinogen, UA: 0.2 mg/dL (ref 0.0–1.0)
pH: 5.5 (ref 5.0–8.0)

## 2013-06-07 LAB — INSULIN, FASTING: Insulin fasting, serum: 14 u[IU]/mL (ref 3–28)

## 2013-06-07 LAB — TSH: TSH: 0.674 u[IU]/mL (ref 0.350–4.500)

## 2013-06-07 LAB — URINALYSIS, MICROSCOPIC ONLY
Bacteria, UA: NONE SEEN
CRYSTALS: NONE SEEN
Casts: NONE SEEN
SQUAMOUS EPITHELIAL / LPF: NONE SEEN
WBC, UA: >50 WBC/hpf — AB (ref <3)

## 2013-06-07 LAB — LIPID PANEL
Cholesterol: 130 mg/dL (ref 0–200)
HDL: 39 mg/dL — AB (ref >39)
LDL CALC: 62 mg/dL (ref 0–99)
TRIGLYCERIDES: 147 mg/dL (ref <150)
Total CHOL/HDL Ratio: 3.3 Ratio
VLDL: 29 mg/dL (ref 0–40)

## 2013-06-07 LAB — VITAMIN D 25 HYDROXY (VIT D DEFICIENCY, FRACTURES): VIT D 25 HYDROXY: 69 ng/mL (ref 30–89)

## 2013-06-08 LAB — URINE CULTURE: Colony Count: >=100000

## 2013-06-09 MED ORDER — FLUCONAZOLE 150 MG PO TABS: 150.0000 mg | ORAL_TABLET | Freq: Every day | ORAL | Status: DC

## 2013-06-09 MED ORDER — CIPROFLOXACIN HCL 500 MG PO TABS: 500.0000 mg | ORAL_TABLET | Freq: Two times a day (BID) | ORAL | Status: AC

## 2013-06-09 NOTE — Addendum Note (Signed)
Addended by: Vicie Mutters R on: 06/09/2013 07:05 AM   Modules accepted: Orders

## 2013-08-14 ENCOUNTER — Telehealth: Payer: Self-pay

## 2013-08-14 NOTE — Telephone Encounter (Signed)
Patient called for samples to combine invokana and metformin, gave patient Invokament 150/1000 mg samples # 120 patient takes one tablet 2 times daily

## 2013-09-07 ENCOUNTER — Ambulatory Visit: Payer: Self-pay | Admitting: Physician Assistant

## 2013-09-08 ENCOUNTER — Encounter: Payer: Self-pay | Admitting: Physician Assistant

## 2013-09-08 ENCOUNTER — Ambulatory Visit (INDEPENDENT_AMBULATORY_CARE_PROVIDER_SITE_OTHER): Payer: 59 | Admitting: Physician Assistant

## 2013-09-08 VITALS — BP 130/70 | HR 80 | Temp 97.7°F | Resp 16 | Wt 198.0 lb

## 2013-09-08 DIAGNOSIS — Z79899 Other long term (current) drug therapy: Secondary | ICD-10-CM

## 2013-09-08 DIAGNOSIS — E119 Type 2 diabetes mellitus without complications: Secondary | ICD-10-CM

## 2013-09-08 DIAGNOSIS — E559 Vitamin D deficiency, unspecified: Secondary | ICD-10-CM

## 2013-09-08 DIAGNOSIS — E785 Hyperlipidemia, unspecified: Secondary | ICD-10-CM

## 2013-09-08 DIAGNOSIS — R030 Elevated blood-pressure reading, without diagnosis of hypertension: Secondary | ICD-10-CM

## 2013-09-08 DIAGNOSIS — M1A30X0 Chronic gout due to renal impairment, unspecified site, without tophus (tophi): Secondary | ICD-10-CM

## 2013-09-08 LAB — CBC WITH DIFFERENTIAL/PLATELET
BASOS PCT: 1 % (ref 0–1)
Basophils Absolute: 0.1 10*3/uL (ref 0.0–0.1)
EOS ABS: 1.1 10*3/uL — AB (ref 0.0–0.7)
EOS PCT: 16 % — AB (ref 0–5)
HCT: 40.2 % (ref 36.0–46.0)
HEMOGLOBIN: 13.8 g/dL (ref 12.0–15.0)
Lymphocytes Relative: 28 % (ref 12–46)
Lymphs Abs: 1.9 10*3/uL (ref 0.7–4.0)
MCH: 31.4 pg (ref 26.0–34.0)
MCHC: 34.3 g/dL (ref 30.0–36.0)
MCV: 91.4 fL (ref 78.0–100.0)
MONO ABS: 0.4 10*3/uL (ref 0.1–1.0)
MONOS PCT: 6 % (ref 3–12)
Neutro Abs: 3.3 10*3/uL (ref 1.7–7.7)
Neutrophils Relative %: 49 % (ref 43–77)
Platelets: 213 10*3/uL (ref 150–400)
RBC: 4.4 MIL/uL (ref 3.87–5.11)
RDW: 14.3 % (ref 11.5–15.5)
WBC: 6.7 10*3/uL (ref 4.0–10.5)

## 2013-09-08 LAB — HEMOGLOBIN A1C
Hgb A1c MFr Bld: 7.8 % — ABNORMAL HIGH (ref <5.7)
Mean Plasma Glucose: 177 mg/dL — ABNORMAL HIGH (ref <117)

## 2013-09-08 MED ORDER — ALLOPURINOL 300 MG PO TABS: 300.0000 mg | ORAL_TABLET | Freq: Every day | ORAL | Status: DC

## 2013-09-08 NOTE — Progress Notes (Signed)
Assessment and Plan:  Hypertension: Continue medication, monitor blood pressure at home. Continue DASH diet. Cholesterol: Continue diet and exercise. Check cholesterol.  Diabetes-Continue diet and exercise. Check A1C Vitamin D Def- check level and continue medications.  Obesity with co morbidities- long discussion about weight loss, diet, and exercise Gout- states only takes allopurinol PRN, discussed that this is not how this medication should be taken and it can make a gout flare worse, will check gout today  Continue diet and meds as discussed. Further disposition pending results of labs. Discussed med's effects and SE's.    HPI 58 y.o. female  presents for 3 month follow up with hypertension, hyperlipidemia, diabetes and vitamin D. Her blood pressure has been controlled at home, today their BP is BP: 130/70 mmHg She does not workout. She denies chest pain, shortness of breath, dizziness.  She is not on cholesterol medication and denies myalgias. Her cholesterol is at goal. The cholesterol last visit was:   Lab Results  Component Value Date   CHOL 130 06/06/2013   HDL 39* 06/06/2013   LDLCALC 62 06/06/2013   TRIG 147 06/06/2013   CHOLHDL 3.3 06/06/2013   She has been working on diet and exercise for Diabetes, she does not check her sugars, she is on invokamet and amaryl, and denies hypoglycemia , paresthesia of the feet, polydipsia and polyuria. Last A1C in the office was:  Lab Results  Component Value Date   HGBA1C 6.9* 06/06/2013   Patient is on Vitamin D supplement. Lab Results  Component Value Date   VD25OH 69 06/06/2013     Patient is on allopurinol for gout but states only takes PRN and does not report a recent flare.   She BMI is Body mass index is 34.52 kg/(m^2)., she admits to poor eating due to birthdays and vacation, her weight is up. She has stopped sodas for the past week and plans on getting serious about her weight loss.  Wt Readings from Last 3 Encounters:  09/08/13  198 lb (89.812 kg)  06/06/13 189 lb (85.73 kg)  02/22/13 187 lb (84.823 kg)      Current Medications:  Current Outpatient Prescriptions on File Prior to Visit  Medication Sig Dispense Refill  . allopurinol (ZYLOPRIM) 300 MG tablet Take 1 tablet (300 mg total) by mouth daily.  90 tablet  1  . aspirin 81 MG tablet Take 81 mg by mouth daily.      . Calcium Carbonate-Vit D-Min (CALCIUM 1200 PO) Take by mouth daily.      . Canagliflozin-Metformin HCl (INVOKAMET) 50-1000 MG TABS Take by mouth 2 (two) times daily.      . Cholecalciferol (VITAMIN D) 2000 UNITS tablet Take 2,000 Units by mouth 2 (two) times daily.      . cyclobenzaprine (FLEXERIL) 10 MG tablet 1-2 at night for headache/TMJ  60 tablet  1  . fluconazole (DIFLUCAN) 150 MG tablet Take 1 tablet (150 mg total) by mouth daily.  1 tablet  3  . glimepiride (AMARYL) 4 MG tablet Take 1 tablet (4 mg total) by mouth 2 (two) times daily.  180 tablet  2  . Magnesium 250 MG TABS Take by mouth daily.      . Multiple Vitamin (MULTIVITAMIN) capsule Take 1 capsule by mouth daily.      . Omega-3 Fatty Acids (FISH OIL) 1000 MG CAPS Take by mouth daily.      . vitamin B-12 (CYANOCOBALAMIN) 1000 MCG tablet Take 1,000 mcg by mouth daily.  No current facility-administered medications on file prior to visit.   Medical History:  Past Medical History  Diagnosis Date  . Hyperlipidemia   . Colon polyps   . Diabetes mellitus without complication   . Anemia   . Anxiety   . Depression   . OSA (obstructive sleep apnea)    Allergies:  Allergies  Allergen Reactions  . Clinoril [Sulindac]   . Erythromycin      Review of Systems: [X]  = complains of  [ ]  = denies  General: Fatigue [ ]  Fever [ ]  Chills [ ]  Weakness [ ]   Insomnia [ ]  Eyes: Redness [ ]  Blurred vision [ ]  Diplopia [ ]   ENT: Congestion [ ]  Sinus Pain [ ]  Post Nasal Drip [ ]  Sore Throat [ ]  Earache [ ]   Cardiac: Chest pain/pressure [ ]  SOB [ ]  Orthopnea [ ]   Palpitations [ ]    Paroxysmal nocturnal dyspnea[ ]  Claudication [ ]  Edema [ ]   Pulmonary: Cough [ ]  Wheezing[ ]   SOB [ ]   Snoring [ ]   GI: Nausea [ ]  Vomiting[ ]  Dysphagia[ ]  Heartburn[ ]  Abdominal pain [ ]  Constipation [ ] ; Diarrhea [ ] ; BRBPR [ ]  Melena[ ]  GU: Hematuria[ ]  Dysuria [ ]  Nocturia[ ]  Urgency [ ]   Hesitancy [ ]  Discharge [ ]  Neuro: Headaches[ ]  Vertigo[ ]  Paresthesias[ ]  Spasm [ ]  Speech changes [ ]  Incoordination [ ]   Ortho: Arthritis [ ]  Joint pain [ ]  Muscle pain [ ]  Joint swelling [ ]  Back Pain [ ]  Skin:  Rash [ ]   Pruritis [ ]  Change in skin lesion [ ]   Psych: Depression[ ]  Anxiety[ ]  Confusion [ ]  Memory loss [ ]   Heme/Lypmh: Bleeding [ ]  Bruising [ ]  Enlarged lymph nodes [ ]   Endocrine: Visual blurring [ ]  Paresthesia [ ]  Polyuria [ ]  Polydypsea [ ]    Heat/cold intolerance [ ]  Hypoglycemia [ ]   Family history- Review and unchanged Social history- Review and unchanged Physical Exam: BP 130/70  Pulse 80  Temp(Src) 97.7 F (36.5 C)  Resp 16  Wt 198 lb (89.812 kg) Wt Readings from Last 3 Encounters:  09/08/13 198 lb (89.812 kg)  06/06/13 189 lb (85.73 kg)  02/22/13 187 lb (84.823 kg)   General Appearance: Well nourished, in no apparent distress. Eyes: PERRLA, EOMs, conjunctiva no swelling or erythema Sinuses: No Frontal/maxillary tenderness ENT/Mouth: Ext aud canals clear, TMs without erythema, bulging. No erythema, swelling, or exudate on post pharynx.  Tonsils not swollen or erythematous. Hearing normal.  Neck: Supple, thyroid normal.  Respiratory: Respiratory effort normal, BS equal bilaterally without rales, rhonchi, wheezing or stridor.  Cardio: RRR with no MRGs. Brisk peripheral pulses without edema.  Abdomen: Soft, + BS.  Non tender, no guarding, rebound, hernias, masses. Lymphatics: Non tender without lymphadenopathy.  Musculoskeletal: Full ROM, 5/5 strength, normal gait.  Skin: Warm, dry without rashes, lesions, ecchymosis.  Neuro: Cranial nerves intact. No cerebellar  symptoms. Sensation intact.  Psych: Awake and oriented X 3, normal affect, Insight and Judgment appropriate.    Vicie Mutters 10:32 AM

## 2013-09-08 NOTE — Patient Instructions (Signed)
Please stop the Amaryl. This medication forces your blood sugar down no matter what it is starting at. This can cause diabetics to have to eat more to keep their blood sugar elevated which goes against what our goals are for you. Only take 1/2 of the Amaryl if your sugar is above 150 and you can take a whole Amaryl if your sugar is above 200 in the morning. If at any time you start to have low blood sugars in the morning or during the day please stop this medication. Please never take this medication if you are sick or can not eat. A low blood sugar is much more dangerous than a high blood sugar.   We want weight loss that will last so you should lose 1-2 pounds a week.  THAT IS IT! Please pick THREE things a month to change. Once it is a habit check off the item. Then pick another three items off the list to become habits.  If you are already doing a habit on the list GREAT!  Cross that item off! o Don't drink your calories. Ie, alcohol, soda, fruit juice, and sweet tea.  o Drink more water. Drink a glass when you feel hungry or before each meal.  o Eat breakfast - Complex carb and protein (likeDannon light and fit yogurt, oatmeal, fruit, eggs, [place] bacon). o Measure your cereal.  Eat no more than one cup a day. (ie [place]) o Eat an apple a day. o Add a vegetable a day. o Try a new vegetable a month. o Use Pam! Stop using oil or butter to cook. o Don't finish your plate or use smaller plates. o Share your dessert. o Eat sugar free Jello for dessert or frozen grapes. o Don't eat 2-3 hours before bed. o Switch to whole wheat bread, pasta, and brown rice. o Make healthier choices when you eat out. No fries! o Pick baked chicken, NOT fried. o Don't forget to SLOW DOWN when you eat. It is not going anywhere.  o Take the stairs. o Park far away in the parking lot o [employer] (or weights) for 10 minutes while watching TV. o Walk at work for 10 minutes during break. o Walk outside 1 time a week  with your friend, kids, dog, or significant other. o Start a walking group at [place] the mall as much as you can tolerate.  o Keep a food diary. o Weigh yourself daily. o Walk for 15 minutes 3 days per week. o Cook at home more often and eat out less.  If life happens and you go back to old habits, it is okay.  Just start over. You can do it!   If you experience chest pain, get short of breath, or tired during the exercise, please stop immediately and inform your doctor.     Bad carbs also include fruit juice, alcohol, and sweet tea. These are empty calories that do not signal to your brain that you are full.   Please remember the good carbs are still carbs which convert into sugar. So please measure them out no more than 1/2-1 cup of rice, oatmeal, pasta, and beans.  Veggies are however free foods! Pile them on.   I like lean protein at every meal such as chicken, [place], pork chops, cottage cheese, etc. Just do not fry these meats and please center your meal around vegetable, the meats should be a side dish.   No all fruit is created  equal. Please see the list below, the fruit at the bottom is higher in sugars than the fruit at the top

## 2013-09-09 LAB — BASIC METABOLIC PANEL WITH GFR
BUN: 15 mg/dL (ref 6–23)
CALCIUM: 9.5 mg/dL (ref 8.4–10.5)
CO2: 24 mEq/L (ref 19–32)
CREATININE: 0.75 mg/dL (ref 0.50–1.10)
Chloride: 102 mEq/L (ref 96–112)
GFR, Est Non African American: 88 mL/min
GLUCOSE: 220 mg/dL — AB (ref 70–99)
Potassium: 4.3 mEq/L (ref 3.5–5.3)
Sodium: 142 mEq/L (ref 135–145)

## 2013-09-09 LAB — LIPID PANEL
CHOLESTEROL: 140 mg/dL (ref 0–200)
HDL: 42 mg/dL (ref >39)
LDL Cholesterol: 50 mg/dL (ref 0–99)
TRIGLYCERIDES: 238 mg/dL — AB (ref <150)
Total CHOL/HDL Ratio: 3.3 Ratio
VLDL: 48 mg/dL — ABNORMAL HIGH (ref 0–40)

## 2013-09-09 LAB — HEPATIC FUNCTION PANEL
ALBUMIN: 4.3 g/dL (ref 3.5–5.2)
ALT: 23 U/L (ref 0–35)
AST: 18 U/L (ref 0–37)
Alkaline Phosphatase: 92 U/L (ref 39–117)
Bilirubin, Direct: 0.1 mg/dL (ref 0.0–0.3)
Indirect Bilirubin: 0.3 mg/dL (ref 0.2–1.2)
Total Bilirubin: 0.4 mg/dL (ref 0.2–1.2)
Total Protein: 6.1 g/dL (ref 6.0–8.3)

## 2013-09-09 LAB — TSH: TSH: 0.668 u[IU]/mL (ref 0.350–4.500)

## 2013-09-09 LAB — URIC ACID: Uric Acid, Serum: 6.2 mg/dL (ref 2.4–7.0)

## 2013-09-09 LAB — INSULIN, FASTING: Insulin fasting, serum: 62 u[IU]/mL — ABNORMAL HIGH (ref 3–28)

## 2013-09-09 LAB — MAGNESIUM: Magnesium: 1.9 mg/dL (ref 1.5–2.5)

## 2013-09-09 LAB — VITAMIN D 25 HYDROXY (VIT D DEFICIENCY, FRACTURES): VIT D 25 HYDROXY: 79 ng/mL (ref 30–89)

## 2013-09-12 ENCOUNTER — Other Ambulatory Visit: Payer: Self-pay | Admitting: *Deleted

## 2013-09-12 DIAGNOSIS — Z1212 Encounter for screening for malignant neoplasm of rectum: Secondary | ICD-10-CM

## 2013-09-12 LAB — POC HEMOCCULT BLD/STL (HOME/3-CARD/SCREEN)
Card #2 Fecal Occult Blod, POC: NEGATIVE
Card #3 Fecal Occult Blood, POC: NEGATIVE
Fecal Occult Blood, POC: NEGATIVE

## 2013-10-11 ENCOUNTER — Other Ambulatory Visit: Payer: Self-pay | Admitting: *Deleted

## 2013-10-11 MED ORDER — CANAGLIFLOZIN-METFORMIN HCL 150-1000 MG PO TABS: ORAL_TABLET | ORAL | Status: DC

## 2013-10-24 ENCOUNTER — Other Ambulatory Visit: Payer: Self-pay

## 2013-10-24 MED ORDER — CANAGLIFLOZIN-METFORMIN HCL 150-1000 MG PO TABS: ORAL_TABLET | ORAL | Status: DC

## 2013-10-24 NOTE — Telephone Encounter (Signed)
Patients insurance will not pay unless sent as 90 day supply, resent #90 via EMR

## 2013-12-21 ENCOUNTER — Ambulatory Visit: Payer: Self-pay | Admitting: Physician Assistant

## 2014-01-03 ENCOUNTER — Ambulatory Visit (INDEPENDENT_AMBULATORY_CARE_PROVIDER_SITE_OTHER): Payer: 59 | Admitting: Physician Assistant

## 2014-01-03 ENCOUNTER — Encounter: Payer: Self-pay | Admitting: Physician Assistant

## 2014-01-03 VITALS — BP 128/78 | HR 80 | Temp 97.7°F | Resp 16 | Ht 63.5 in | Wt 200.0 lb

## 2014-01-03 DIAGNOSIS — E559 Vitamin D deficiency, unspecified: Secondary | ICD-10-CM

## 2014-01-03 DIAGNOSIS — N182 Chronic kidney disease, stage 2 (mild): Secondary | ICD-10-CM

## 2014-01-03 DIAGNOSIS — E1122 Type 2 diabetes mellitus with diabetic chronic kidney disease: Secondary | ICD-10-CM

## 2014-01-03 DIAGNOSIS — I10 Essential (primary) hypertension: Secondary | ICD-10-CM

## 2014-01-03 DIAGNOSIS — Z79899 Other long term (current) drug therapy: Secondary | ICD-10-CM

## 2014-01-03 DIAGNOSIS — E785 Hyperlipidemia, unspecified: Secondary | ICD-10-CM

## 2014-01-03 DIAGNOSIS — M109 Gout, unspecified: Secondary | ICD-10-CM

## 2014-01-03 LAB — CBC WITH DIFFERENTIAL/PLATELET
Basophils Absolute: 0.1 10*3/uL (ref 0.0–0.1)
Basophils Relative: 1 % (ref 0–1)
Eosinophils Absolute: 0.2 10*3/uL (ref 0.0–0.7)
Eosinophils Relative: 4 % (ref 0–5)
HEMATOCRIT: 44.4 % (ref 36.0–46.0)
HEMOGLOBIN: 15 g/dL (ref 12.0–15.0)
LYMPHS ABS: 2.2 10*3/uL (ref 0.7–4.0)
Lymphocytes Relative: 36 % (ref 12–46)
MCH: 30.8 pg (ref 26.0–34.0)
MCHC: 33.8 g/dL (ref 30.0–36.0)
MCV: 91.2 fL (ref 78.0–100.0)
MPV: 9.8 fL (ref 9.4–12.4)
Monocytes Absolute: 0.4 10*3/uL (ref 0.1–1.0)
Monocytes Relative: 7 % (ref 3–12)
NEUTROS ABS: 3.1 10*3/uL (ref 1.7–7.7)
NEUTROS PCT: 52 % (ref 43–77)
Platelets: 208 10*3/uL (ref 150–400)
RBC: 4.87 MIL/uL (ref 3.87–5.11)
RDW: 14.1 % (ref 11.5–15.5)
WBC: 6 10*3/uL (ref 4.0–10.5)

## 2014-01-03 MED ORDER — LISINOPRIL 10 MG PO TABS: 10.0000 mg | ORAL_TABLET | Freq: Every day | ORAL | Status: DC

## 2014-01-03 NOTE — Patient Instructions (Signed)
ACE inhibitors are blood pressure medications that protect your heart and kidneys. It can cause two symptoms: The most common symptom is a dry cough/tickle in your throat that can happen the first day you take it or 5 years after you have been taking it. Please call us if you have this and we can switch it to a different medications. The least common side effect is called angioedema which is swelling of your lips and tongue and can cause problems with your breathing. This is a very very rare side effect but very serious. If this happens please stop the medication and go to the ER.   Recommendations For Diabetic/Prediabetic Patients:   -  Take medications as prescribed  -  Recommend Dr Fara Olden Fuhrman's book "The End of Diabetes "  And "The End of Dieting"- Can get at  [email or web address] and encourage also get the Audio CD book  - AVOID Animal products, ie. Meat - red/white, Poultry and Dairy/especially cheese - Exercise at least 5 times a week for 30 minutes or preferably daily.  - No Smoking - Drink less than 2 drinks a day.  - Monitor your feet for sores - Have yearly Eye Exams - Recommend annual Flu vaccine  - Recommend Pneumovax and Prevnar vaccines - Shingles Vaccine (Zostavax) if over 9 y.o.  Goals:   - BMI less than 24 - Fasting sugar less than 130 or less than 150 if tapering medicines to lose weight  - Systolic BP less than 637  - Diastolic BP less than 80 - Bad LDL Cholesterol less than 70 - Triglycerides less than 150     Bad carbs also include fruit juice, alcohol, and sweet tea. These are empty calories that do not signal to your brain that you are full.   Please remember the good carbs are still carbs which convert into sugar. So please measure them out no more than 1/2-1 cup of rice, oatmeal, pasta, and beans  Veggies are however free foods! Pile them on.   Not all fruit is created equal. Please see the list below, the fruit at the bottom is higher in sugars than the  fruit at the top. Please avoid all dried fruits.

## 2014-01-03 NOTE — Progress Notes (Signed)
Assessment and Plan:  Hypertension: Continue medication, monitor blood pressure at home. Continue DASH diet.  Reminder to go to the ER if any CP, SOB, nausea, dizziness, severe HA, changes vision/speech, left arm numbness and tingling and jaw pain. Cholesterol: Continue diet and exercise. Check cholesterol.  Diabetes-Continue diet and exercise. Check A1C, will start ACE since not on it but she would prefer to wait, stop sodas.  Vitamin D Def- check level and continue medications.  Obesity with co morbidities- long discussion about weight loss, diet, and exercise Gout- recheck Uric acid, Diet discussed,depending results will restart med or not   Continue diet and meds as discussed. Further disposition pending results of labs. Discussed med's effects and SE's.   HPI 58 y.o. female  presents for 3 month follow up with hypertension, hyperlipidemia, diabetes and vitamin D. Her blood pressure has been controlled at home, today their BP is BP: 128/78 mmHg She does not workout. She denies chest pain, shortness of breath, dizziness.  She is not on cholesterol medication and denies myalgias. Her cholesterol is at goal. The cholesterol was:  09/08/2013: Cholesterol, Total 140; HDL Cholesterol by NMR 42; LDL (calc) 50; Triglycerides 238* She has been working on diet and exercise for Diabetes, she has been checking her sugars at home running 150 fasting, she is on Invokamet 150/1000mg  twice a day, amaryl 4mg  BID, basa and she is not on ACE/ARB, and denies paresthesia of the feet, polydipsia and polyuria. Last A1C  was: 09/08/2013: Hemoglobin-A1c 7.8* Patient is on Vitamin D supplement. She was on allopurinol for gout but last time she was not on it and last uric acid was 6.2.  09/08/2013: Vit D, 25-Hydroxy 79  BMI is Body mass index is 34.87 kg/(m^2)., she is working on diet and exercise and has done well.  Wt Readings from Last 3 Encounters:  01/03/14 200 lb (90.719 kg)  09/08/13 198 lb (89.812 kg)   06/06/13 189 lb (85.73 kg)    Current Medications:  Current Outpatient Prescriptions on File Prior to Visit  Medication Sig Dispense Refill  . allopurinol (ZYLOPRIM) 300 MG tablet Take 1 tablet (300 mg total) by mouth daily. 90 tablet 1  . aspirin 81 MG tablet Take 81 mg by mouth daily.    . Calcium Carbonate-Vit D-Min (CALCIUM 1200 PO) Take by mouth daily.    . Canagliflozin-Metformin HCl (INVOKAMET) [phone] MG TABS Take one tablet twice daily 180 tablet 3  . Cholecalciferol (VITAMIN D) 2000 UNITS tablet Take 2,000 Units by mouth 2 (two) times daily.    . cyclobenzaprine (FLEXERIL) 10 MG tablet 1-2 at night for headache/TMJ 60 tablet 1  . fluconazole (DIFLUCAN) 150 MG tablet Take 1 tablet (150 mg total) by mouth daily. 1 tablet 3  . glimepiride (AMARYL) 4 MG tablet Take 1 tablet (4 mg total) by mouth 2 (two) times daily. 180 tablet 2  . Magnesium 250 MG TABS Take by mouth daily.    . Multiple Vitamin (MULTIVITAMIN) capsule Take 1 capsule by mouth daily.    . Omega-3 Fatty Acids (FISH OIL) 1000 MG CAPS Take by mouth daily.    . vitamin B-12 (CYANOCOBALAMIN) 1000 MCG tablet Take 1,000 mcg by mouth daily.     No current facility-administered medications on file prior to visit.   Medical History:  Past Medical History  Diagnosis Date  . Hyperlipidemia   . Colon polyps   . Diabetes mellitus without complication   . Anemia   . Anxiety   . Depression   .  OSA (obstructive sleep apnea)    Allergies:  Allergies  Allergen Reactions  . Clinoril [Sulindac]   . Erythromycin      Review of Systems: [X]  = complains of  [ ]  = denies  General: Fatigue [ ]  Fever [ ]  Chills [ ]  Weakness [ ]   Insomnia [ ]  Eyes: Redness [ ]  Blurred vision [ ]  Diplopia [ ]   ENT: Congestion [ ]  Sinus Pain [ ]  Post Nasal Drip [ ]  Sore Throat [ ]  Earache [ ]   Cardiac: Chest pain/pressure [ ]  SOB [ ]  Orthopnea [ ]   Palpitations [ ]   Paroxysmal nocturnal dyspnea[ ]  Claudication [ ]  Edema [ ]   Pulmonary: Cough [  ] Wheezing[ ]   SOB [ ]   Snoring [ ]   GI: Nausea [ ]  Vomiting[ ]  Dysphagia[ ]  Heartburn[ ]  Abdominal pain [ ]  Constipation [ ] ; Diarrhea [ ] ; BRBPR [ ]  Melena[ ]  GU: Hematuria[ ]  Dysuria [ ]  Nocturia[ ]  Urgency [ ]   Hesitancy [ ]  Discharge [ ]  Neuro: Headaches[ ]  Vertigo[ ]  Paresthesias[ ]  Spasm [ ]  Speech changes [ ]  Incoordination [ ]   Ortho: Arthritis [ ]  Joint pain [ ]  Muscle pain [ ]  Joint swelling [ ]  Back Pain [ ]  Skin:  Rash [ ]   Pruritis [ ]  Change in skin lesion [ ]   Psych: Depression[ ]  Anxiety[ ]  Confusion [ ]  Memory loss [ ]   Heme/Lypmh: Bleeding [ ]  Bruising [ ]  Enlarged lymph nodes [ ]   Endocrine: Visual blurring [ ]  Paresthesia [ ]  Polyuria [ ]  Polydypsea [ ]    Heat/cold intolerance [ ]  Hypoglycemia [ ]   Family history- Review and unchanged Social history- Review and unchanged Physical Exam: BP 128/78 mmHg  Pulse 80  Temp(Src) 97.7 F (36.5 C)  Resp 16  Ht 5' 3.5" (1.613 m)  Wt 200 lb (90.719 kg)  BMI 34.87 kg/m2 Wt Readings from Last 3 Encounters:  01/03/14 200 lb (90.719 kg)  09/08/13 198 lb (89.812 kg)  06/06/13 189 lb (85.73 kg)   General Appearance: Well nourished, in no apparent distress. Eyes: PERRLA, EOMs, conjunctiva no swelling or erythema Sinuses: No Frontal/maxillary tenderness ENT/Mouth: Ext aud canals clear, TMs without erythema, bulging. No erythema, swelling, or exudate on post pharynx.  Tonsils not swollen or erythematous. Hearing normal.  Neck: Supple, thyroid normal.  Respiratory: Respiratory effort normal, BS equal bilaterally without rales, rhonchi, wheezing or stridor.  Cardio: RRR with no MRGs. Brisk peripheral pulses without edema.  Abdomen: Soft, + BS.  Non tender, no guarding, rebound, hernias, masses. Lymphatics: Non tender without lymphadenopathy.  Musculoskeletal: Full ROM, 5/5 strength, normal gait.  Skin: Warm, dry without rashes, lesions, ecchymosis.  Neuro: Cranial nerves intact. No cerebellar symptoms. Sensation intact.  Psych:  Awake and oriented X 3, normal affect, Insight and Judgment appropriate.    Vicie Mutters, PA-C 9:39 AM [facility]

## 2014-01-04 LAB — LIPID PANEL
CHOLESTEROL: 142 mg/dL (ref 0–200)
HDL: 42 mg/dL (ref >39)
LDL Cholesterol: 58 mg/dL (ref 0–99)
Total CHOL/HDL Ratio: 3.4 Ratio
Triglycerides: 208 mg/dL — ABNORMAL HIGH (ref <150)
VLDL: 42 mg/dL — AB (ref 0–40)

## 2014-01-04 LAB — MAGNESIUM: Magnesium: 2 mg/dL (ref 1.5–2.5)

## 2014-01-04 LAB — HEPATIC FUNCTION PANEL
ALK PHOS: 84 U/L (ref 39–117)
ALT: 36 U/L — AB (ref 0–35)
AST: 28 U/L (ref 0–37)
Albumin: 4.6 g/dL (ref 3.5–5.2)
BILIRUBIN DIRECT: 0.1 mg/dL (ref 0.0–0.3)
BILIRUBIN INDIRECT: 0.4 mg/dL (ref 0.2–1.2)
BILIRUBIN TOTAL: 0.5 mg/dL (ref 0.2–1.2)
Total Protein: 7 g/dL (ref 6.0–8.3)

## 2014-01-04 LAB — HEMOGLOBIN A1C
Hgb A1c MFr Bld: 7.8 % — ABNORMAL HIGH (ref <5.7)
MEAN PLASMA GLUCOSE: 177 mg/dL — AB (ref <117)

## 2014-01-04 LAB — VITAMIN D 25 HYDROXY (VIT D DEFICIENCY, FRACTURES): Vit D, 25-Hydroxy: 50 ng/mL (ref 30–100)

## 2014-01-04 LAB — BASIC METABOLIC PANEL WITH GFR
BUN: 11 mg/dL (ref 6–23)
CHLORIDE: 101 meq/L (ref 96–112)
CO2: 26 meq/L (ref 19–32)
Calcium: 10 mg/dL (ref 8.4–10.5)
Creat: 0.73 mg/dL (ref 0.50–1.10)
GFR, Est African American: >89 mL/min
GFR, Est Non African American: >89 mL/min
GLUCOSE: 150 mg/dL — AB (ref 70–99)
POTASSIUM: 4.6 meq/L (ref 3.5–5.3)
SODIUM: 139 meq/L (ref 135–145)

## 2014-01-04 LAB — URIC ACID: Uric Acid, Serum: 6.5 mg/dL (ref 2.4–7.0)

## 2014-01-04 LAB — INSULIN, FASTING: Insulin fasting, serum: 10.6 u[IU]/mL (ref 2.0–19.6)

## 2014-01-04 LAB — TSH: TSH: 1.112 u[IU]/mL (ref 0.350–4.500)

## 2014-01-06 ENCOUNTER — Encounter: Payer: Self-pay | Admitting: *Deleted

## 2014-02-06 ENCOUNTER — Other Ambulatory Visit: Payer: Self-pay | Admitting: Physician Assistant

## 2014-02-06 ENCOUNTER — Other Ambulatory Visit: Payer: Self-pay | Admitting: Internal Medicine

## 2014-02-06 MED ORDER — MOMETASONE FUROATE 50 MCG/ACT NA SUSP: 2.0000 | Freq: Every day | NASAL | Status: DC

## 2014-02-22 ENCOUNTER — Encounter: Payer: Self-pay | Admitting: Emergency Medicine

## 2014-02-23 ENCOUNTER — Encounter: Payer: Self-pay | Admitting: Emergency Medicine

## 2014-02-23 ENCOUNTER — Ambulatory Visit (INDEPENDENT_AMBULATORY_CARE_PROVIDER_SITE_OTHER): Payer: 59 | Admitting: Emergency Medicine

## 2014-02-23 VITALS — BP 136/74 | HR 82 | Temp 98.4°F | Resp 16 | Ht 63.5 in | Wt 197.0 lb

## 2014-02-23 DIAGNOSIS — E119 Type 2 diabetes mellitus without complications: Secondary | ICD-10-CM

## 2014-02-23 DIAGNOSIS — Z111 Encounter for screening for respiratory tuberculosis: Secondary | ICD-10-CM

## 2014-02-23 DIAGNOSIS — G4733 Obstructive sleep apnea (adult) (pediatric): Secondary | ICD-10-CM

## 2014-02-23 DIAGNOSIS — R6889 Other general symptoms and signs: Secondary | ICD-10-CM

## 2014-02-23 DIAGNOSIS — I10 Essential (primary) hypertension: Secondary | ICD-10-CM

## 2014-02-23 DIAGNOSIS — Z1212 Encounter for screening for malignant neoplasm of rectum: Secondary | ICD-10-CM

## 2014-02-23 DIAGNOSIS — Z0001 Encounter for general adult medical examination with abnormal findings: Secondary | ICD-10-CM

## 2014-02-23 DIAGNOSIS — E781 Pure hyperglyceridemia: Secondary | ICD-10-CM

## 2014-02-23 DIAGNOSIS — Z9989 Dependence on other enabling machines and devices: Secondary | ICD-10-CM

## 2014-02-23 DIAGNOSIS — R350 Frequency of micturition: Secondary | ICD-10-CM

## 2014-02-23 DIAGNOSIS — Z0000 Encounter for general adult medical examination without abnormal findings: Secondary | ICD-10-CM

## 2014-02-23 LAB — HEPATIC FUNCTION PANEL
ALT: 49 U/L — ABNORMAL HIGH (ref 0–35)
AST: 36 U/L (ref 0–37)
Albumin: 4.5 g/dL (ref 3.5–5.2)
Alkaline Phosphatase: 77 U/L (ref 39–117)
BILIRUBIN INDIRECT: 0.5 mg/dL (ref 0.2–1.2)
BILIRUBIN TOTAL: 0.6 mg/dL (ref 0.2–1.2)
Bilirubin, Direct: 0.1 mg/dL (ref 0.0–0.3)
TOTAL PROTEIN: 7.1 g/dL (ref 6.0–8.3)

## 2014-02-23 LAB — BASIC METABOLIC PANEL WITH GFR
BUN: 14 mg/dL (ref 6–23)
CHLORIDE: 102 meq/L (ref 96–112)
CO2: 27 mEq/L (ref 19–32)
CREATININE: 0.7 mg/dL (ref 0.50–1.10)
Calcium: 9.8 mg/dL (ref 8.4–10.5)
GFR, Est African American: >89 mL/min
GLUCOSE: 128 mg/dL — AB (ref 70–99)
Potassium: 4.2 mEq/L (ref 3.5–5.3)
Sodium: 140 mEq/L (ref 135–145)

## 2014-02-23 LAB — CBC WITH DIFFERENTIAL/PLATELET
Basophils Absolute: 0.1 10*3/uL (ref 0.0–0.1)
Basophils Relative: 1 % (ref 0–1)
Eosinophils Absolute: 0.2 10*3/uL (ref 0.0–0.7)
Eosinophils Relative: 4 % (ref 0–5)
HCT: 45.5 % (ref 36.0–46.0)
Hemoglobin: 15 g/dL (ref 12.0–15.0)
Lymphocytes Relative: 36 % (ref 12–46)
Lymphs Abs: 2.1 10*3/uL (ref 0.7–4.0)
MCH: 31.2 pg (ref 26.0–34.0)
MCHC: 33 g/dL (ref 30.0–36.0)
MCV: 94.6 fL (ref 78.0–100.0)
MPV: 9.9 fL (ref 8.6–12.4)
Monocytes Absolute: 0.4 10*3/uL (ref 0.1–1.0)
Monocytes Relative: 7 % (ref 3–12)
Neutro Abs: 3 10*3/uL (ref 1.7–7.7)
Neutrophils Relative %: 52 % (ref 43–77)
PLATELETS: 211 10*3/uL (ref 150–400)
RBC: 4.81 MIL/uL (ref 3.87–5.11)
RDW: 13.9 % (ref 11.5–15.5)
WBC: 5.7 10*3/uL (ref 4.0–10.5)

## 2014-02-23 MED ORDER — MOMETASONE FUROATE 50 MCG/ACT NA SUSP: 2.0000 | Freq: Every day | NASAL | Status: DC

## 2014-02-23 NOTE — Progress Notes (Signed)
Subjective:    Patient ID: Amanda Cooper, female    DOB: [birth year], 58 y.o.   MRN: [id number]  HPI Comments: 59 yo WF CPE. She has recently started on Lisinopril for HTN and kidney protection with DM. She notes BP good at home. She has had a slight cough. SHe has not been checking BS routinely. She checks feet routinely and denies skin break down or neuropathy increase. She has been trying to improve diet and lose weight. She has been exercising x 4 weeks. She wears her CPAP (2007) nightly but thinks pressure may be too high with recent weight loss.   She notes with increased walking she has increased left achilles pain x 3 weeks. She has tried Voltaren gel without relief. She has not been resting,stretching or icing. She denies obvious injury.  She notes depression has improved. She is not on any medicine for anxiety/ depression currently.  She has had left back pain that radiates into her left shoulder on/ off without known injury x several months. She denies chest pain.  She notes increased urine frequency and incontinence x several weeks.    Lab Results      Component                Value               Date                      WBC                      6.0                 01/03/2014                HGB                      15.0                01/03/2014                HCT                      44.4                01/03/2014                PLT                      208                 01/03/2014                GLUCOSE                  150*                01/03/2014                CHOL                     142                 01/03/2014                TRIG                     208*  01/03/2014                HDL                      42                  01/03/2014                LDLCALC                  58                  01/03/2014                ALT                      36*                 01/03/2014                AST                      28                   01/03/2014                NA                       139                 01/03/2014                K                        4.6                 01/03/2014                CL                       101                 01/03/2014                CREATININE               0.73                01/03/2014                BUN                      11                  01/03/2014                CO2                      26                  01/03/2014                TSH                      1.112  01/03/2014                HGBA1C                   7.8*                01/03/2014                MICROALBUR               0.94                02/22/2013               Medication List       This list is accurate as of: 02/23/14 11:59 PM.  Always use your most recent med list.               aspirin 81 MG tablet  Take 81 mg by mouth daily.     CALCIUM 1200 PO  Take by mouth daily.     Canagliflozin-Metformin HCl [phone] MG Tabs  Commonly known as:  INVOKAMET  Take one tablet twice daily     cyclobenzaprine 10 MG tablet  Commonly known as:  FLEXERIL  1-2 at night for headache/TMJ     Fish Oil 1000 MG Caps  Take by mouth daily.     fluconazole 150 MG tablet  Commonly known as:  DIFLUCAN  Take 1 tablet (150 mg total) by mouth daily.     glimepiride 4 MG tablet  Commonly known as:  AMARYL  Take 1 tablet (4 mg total) by mouth 2 (two) times daily.     lisinopril 10 MG tablet  Commonly known as:  PRINIVIL,ZESTRIL  TAKE 1 TABLET (10 MG TOTAL) BY MOUTH DAILY.     Magnesium 250 MG Tabs  Take by mouth daily.     mometasone 50 MCG/ACT nasal spray  Commonly known as:  NASONEX  Place 2 sprays into the nose daily.     multivitamin capsule  Take 1 capsule by mouth daily.     vitamin B-12 1000 MCG tablet  Commonly known as:  CYANOCOBALAMIN  Take 1,000 mcg by mouth daily.     Vitamin D 2000 UNITS tablet  Take 2,000 Units by mouth 2 (two) times daily.       Allergies  Allergen  Reactions  . Clinoril [Sulindac]   . Erythromycin     Past Medical History  Diagnosis Date  . Hyperlipidemia   . Colon polyps   . Diabetes mellitus without complication   . Anemia   . Anxiety   . Hypertension   . Depression, controlled   . Gout   . Obstructive sleep apnea on CPAP    Past Surgical History  Procedure Laterality Date  . Abdominal hysterectomy    . Breast surgery Left     asp cyst  . Ovary surgery Left 1999  . Achilles Right 2009    Bednarz- reattached achilles/ removed spur/ lengthened calf   History  Substance Use Topics  . Smoking status: Never Smoker   . Smokeless tobacco: Not on file  . Alcohol Use: No   Family History  Problem Relation Age of Onset  . Heart disease Father   . Alcohol abuse Father   . Heart attack Father   . Diabetes Sister   . COPD Mother   . Depression Mother    MAINTENANCE: Colonoscopy:2013 polyps due 2018 Mammo:12/28/13 BMD:02/05/2009 WNL DECLINES Update Pap/ Pelvic:2013 WNL  2018 due EYE: 02/2013 WNL Dentist:q 6 month CXR: 2012  IMMUNIZATIONS: Tdap:2013 Pneumovax:declines Zostavax:2008 Influenza:declines  Patient Care Team: Unk Pinto, MD as PCP - General (Internal Medicine) Linton Rump, MD as Consulting Physician (Ophthalmology) Mayme Genta, MD as Consulting Physician (Gastroenterology) Lovenia Kim, MD as Consulting Physician (Obstetrics and Gynecology) Daryll Brod, MD as Consulting Physician (Orthopedic Surgery) Lavonna Monarch, MD as Consulting Physician (Dermatology) Blanchard Mane) Sheralyn Boatman, (Dentist)  Review of Systems  Respiratory: Negative for shortness of breath.   Cardiovascular: Negative for chest pain.  Genitourinary: Positive for frequency and difficulty urinating.  Musculoskeletal: Positive for back pain.  All other systems reviewed and are negative.  BP 136/74 mmHg  Pulse 82  Temp(Src) 98.4 F (36.9 C) (Temporal)  Resp 16  Ht 5' 3.5" (1.613 m)  Wt 197 lb (89.359 kg)  BMI  34.35 kg/m2     Objective:   Physical Exam  Constitutional: She is oriented to person, place, and time. She appears well-developed and well-nourished. No distress.  HENT:  Head: Normocephalic.  Nose: Nose normal.  Mouth/Throat: Oropharynx is clear and moist.  Large tongue, partially obstructed airway  Eyes: Conjunctivae and EOM are normal. Pupils are equal, round, and reactive to light. No scleral icterus.  Neck: Normal range of motion. Neck supple. No JVD present. No tracheal deviation present. No thyromegaly present.  Cardiovascular: Normal rate, regular rhythm, normal heart sounds and intact distal pulses.   Pulmonary/Chest: Effort normal and breath sounds normal.  Abdominal: Soft. Bowel sounds are normal. She exhibits no distension and no mass. There is no tenderness.  Musculoskeletal: Normal range of motion. She exhibits no edema or tenderness.  Lymphadenopathy:    She has no cervical adenopathy.  Neurological: She is alert and oriented to person, place, and time. She has normal reflexes. No cranial nerve deficit. She exhibits normal muscle tone. Coordination normal.  Skin: Skin is warm and dry. No rash noted. No erythema.  L>R peeling foot  Psychiatric: She has a normal mood and affect. Her behavior is normal. Judgment and thought content normal.  Nursing note and vitals reviewed.     AORTA SCAN WNL EKG NSCSPT     Assessment & Plan:  1. CPE- Update screening labs/ History/ Immunizations/ Testing as needed. Advised healthy diet, QD exercise, increase H20 and continue RX/ Vitamins AD. Advised too early for [facility] labs chol/ diabetes. Advised needs checked end of FEB early March.  2.  3 month F/U for HTN, Cholesterol,DM, D. Deficient. Needs healthy diet, cardio QD and obtain healthy weight. Check Labs, Check BP if >130/80 call office, Check BS if >200 call office. Foot hygiene explained, may need podiatry referral if no improvement with treatment  3. Urine frequency/  incontinence- Check labs if NEG refer for pelvic floor rehab/ urology consult  4. ? Achilles injury- stretches advised, if no improvement needs re-evaluation with ortho.  5. Depression resolved- continue to monitor, w/c with any concerns  6. Back/ shoulder pain (not present today)- continue to monitor and advised Diabetics atypical with cardiac disease needs close monitoring, w/c if symptoms return for further evaluation.  7. OSA on CPAP- Advised needs to get titration level checked with supply company

## 2014-02-23 NOTE — Patient Instructions (Signed)
Achilles Tendinitis  with Rehab Achilles tendinitis is a disorder of the Achilles tendon. The Achilles tendon connects the large calf muscles (Gastrocnemius and Soleus) to the heel bone (calcaneus). This tendon is sometimes called the heel cord. It is important for pushing-off and standing on your toes and is important for walking, running, or jumping. Tendinitis is often caused by overuse and repetitive microtrauma. SYMPTOMS  Pain, tenderness, swelling, warmth, and redness may occur over the Achilles tendon even at rest.  Pain with pushing off, or flexing or extending the ankle.  Pain that is worsened after or during activity. CAUSES   Overuse sometimes seen with rapid increase in exercise programs or in sports requiring running and jumping.  Poor physical conditioning (strength and flexibility or endurance).  Running sports, especially training running down hills.  Inadequate warm-up before practice or play or failure to stretch before participation.  Injury to the tendon. PREVENTION   Warm up and stretch before practice or competition.  Allow time for adequate rest and recovery between practices and competition.  Keep up conditioning.  Keep up ankle and leg flexibility.  Improve or keep muscle strength and endurance.  Improve cardiovascular fitness.  Use proper technique.  Use proper equipment (shoes, skates).  To help prevent recurrence, taping, protective strapping, or an adhesive bandage may be recommended for several weeks after healing is complete. PROGNOSIS   Recovery may take weeks to several months to heal.  Longer recovery is expected if symptoms have been prolonged.  Recovery is usually quicker if the inflammation is due to a direct blow as compared with overuse or sudden strain. RELATED COMPLICATIONS   Healing time will be prolonged if the condition is not correctly treated. The injury must be given plenty of time to heal.  Symptoms can reoccur if  activity is resumed too soon.  Untreated, tendinitis may increase the risk of tendon rupture requiring additional time for recovery and possibly surgery. TREATMENT   The first treatment consists of rest anti-inflammatory medication, and ice to relieve the pain.  Stretching and strengthening exercises after resolution of pain will likely help reduce the risk of recurrence. Referral to a physical therapist or athletic trainer for further evaluation and treatment may be helpful.  A walking boot or cast may be recommended to rest the Achilles tendon. This can help break the cycle of inflammation and microtrauma.  Arch supports (orthotics) may be prescribed or recommended by your caregiver as an adjunct to therapy and rest.  Surgery to remove the inflamed tendon lining or degenerated tendon tissue is rarely necessary and has shown less than predictable results. MEDICATION   Nonsteroidal anti-inflammatory medications, such as aspirin and ibuprofen, may be used for pain and inflammation relief. Do not take within 7 days before surgery. Take these as directed by your caregiver. Contact your caregiver immediately if any bleeding, stomach upset, or signs of allergic reaction occur. Other minor pain relievers, such as acetaminophen, may also be used.  Pain relievers may be prescribed as necessary by your caregiver. Do not take prescription pain medication for longer than 4 to 7 days. Use only as directed and only as much as you need.  Cortisone injections are rarely indicated. Cortisone injections may weaken tendons and predispose to rupture. It is better to give the condition more time to heal than to use them. HEAT AND COLD  Cold is used to relieve pain and reduce inflammation for acute and chronic Achilles tendinitis. Cold should be applied for 10 to 15 minutes   every 2 to 3 hours for inflammation and pain and immediately after any activity that aggravates your symptoms. Use ice packs or an ice  massage.  Heat may be used before performing stretching and strengthening activities prescribed by your caregiver. Use a heat pack or a warm soak. SEEK MEDICAL CARE IF:  Symptoms get worse or do not improve in 2 weeks despite treatment.  New, unexplained symptoms develop. Drugs used in treatment may produce side effects. EXERCISES RANGE OF MOTION (ROM) AND STRETCHING EXERCISES - Achilles Tendinitis  These exercises may help you when beginning to rehabilitate your injury. Your symptoms may resolve with or without further involvement from your physician, physical therapist or athletic trainer. While completing these exercises, remember:   Restoring tissue flexibility helps normal motion to return to the joints. This allows healthier, less painful movement and activity.  An effective stretch should be held for at least 30 seconds.  A stretch should never be painful. You should only feel a gentle lengthening or release in the stretched tissue. STRETCH - Gastroc, Standing   Place hands on wall.  Extend right / left leg, keeping the front knee somewhat bent.  Slightly point your toes inward on your back foot.  Keeping your right / left heel on the floor and your knee straight, shift your weight toward the wall, not allowing your back to arch.  You should feel a gentle stretch in the right / left calf. Hold this position for __________ seconds. Repeat __________ times. Complete this stretch __________ times per day. STRETCH - Soleus, Standing   Place hands on wall.  Extend right / left leg, keeping the other knee somewhat bent.  Slightly point your toes inward on your back foot.  Keep your right / left heel on the floor, bend your back knee, and slightly shift your weight over the back leg so that you feel a gentle stretch deep in your back calf.  Hold this position for __________ seconds. Repeat __________ times. Complete this stretch __________ times per day. STRETCH -  Gastrocsoleus, Standing  Note: This exercise can place a lot of stress on your foot and ankle. Please complete this exercise only if specifically instructed by your caregiver.   Place the ball of your right / left foot on a step, keeping your other foot firmly on the same step.  Hold on to the wall or a rail for balance.  Slowly lift your other foot, allowing your body weight to press your heel down over the edge of the step.  You should feel a stretch in your right / left calf.  Hold this position for __________ seconds.  Repeat this exercise with a slight bend in your knee. Repeat __________ times. Complete this stretch __________ times per day.  STRENGTHENING EXERCISES - Achilles Tendinitis These exercises may help you when beginning to rehabilitate your injury. They may resolve your symptoms with or without further involvement from your physician, physical therapist or athletic trainer. While completing these exercises, remember:   Muscles can gain both the endurance and the strength needed for everyday activities through controlled exercises.  Complete these exercises as instructed by your physician, physical therapist or athletic trainer. Progress the resistance and repetitions only as guided.  You may experience muscle soreness or fatigue, but the pain or discomfort you are trying to eliminate should never worsen during these exercises. If this pain does worsen, stop and make certain you are following the directions exactly. If the pain is still present after  adjustments, discontinue the exercise until you can discuss the trouble with your clinician. STRENGTH - Plantar-flexors   Sit with your right / left leg extended. Holding onto both ends of a rubber exercise band/tubing, loop it around the ball of your foot. Keep a slight tension in the band.  Slowly push your toes away from you, pointing them downward.  Hold this position for __________ seconds. Return slowly, controlling the  tension in the band/tubing. Repeat __________ times. Complete this exercise __________ times per day.  STRENGTH - Plantar-flexors   Stand with your feet shoulder width apart. Steady yourself with a wall or table using as little support as needed.  Keeping your weight evenly spread over the width of your feet, rise up on your toes.*  Hold this position for __________ seconds. Repeat __________ times. Complete this exercise __________ times per day.  *If this is too easy, shift your weight toward your right / left leg until you feel challenged. Ultimately, you may be asked to do this exercise with your right / left foot only. STRENGTH - Plantar-flexors, Eccentric  Note: This exercise can place a lot of stress on your foot and ankle. Please complete this exercise only if specifically instructed by your caregiver.   Place the balls of your feet on a step. With your hands, use only enough support from a wall or rail to keep your balance.  Keep your knees straight and rise up on your toes.  Slowly shift your weight entirely to your right / left toes and pick up your opposite foot. Gently and with controlled movement, lower your weight through your right / left foot so that your heel drops below the level of the step. You will feel a slight stretch in the back of your calf at the end position.  Use the healthy leg to help rise up onto the balls of both feet, then lower weight only on the right / left leg again. Build up to 15 repetitions. Then progress to 3 consecutive sets of 15 repetitions.*  After completing the above exercise, complete the same exercise with a slight knee bend (about 30 degrees). Again, build up to 15 repetitions. Then progress to 3 consecutive sets of 15 repetitions.* Perform this exercise __________ times per day.  *When you easily complete 3 sets of 15, your physician, physical therapist or athletic trainer may advise you to add resistance by wearing a backpack filled with  additional weight. STRENGTH - Plantar Flexors, Seated   Sit on a chair that allows your feet to rest flat on the ground. If necessary, sit at the edge of the chair.  Keeping your toes firmly on the ground, lift your right / left heel as far as you can without increasing any discomfort in your ankle. Repeat __________ times. Complete this exercise __________ times a day. *If instructed by your physician, physical therapist or athletic trainer, you may add ____________________ of resistance by placing a weighted object on your right / left knee. Document Released: 08/27/2004 Document Revised: 04/20/2011 Document Reviewed: 05/10/2008 [facility] Patient Information 2015 [place], [place]. This information is not intended to replace advice given to you by your health care provider. Make sure you discuss any questions you have with your health care provider. Tuberculin Skin Test The PPD skin test is a method used to help with the diagnosis of a disease called tuberculosis (TB). HOW THE TEST IS DONE  The test site (usually the forearm) is cleansed. The PPD extract is then injected under  the top layer of skin, causing a blister to form on the skin. The reaction will take 48 - 72 hours to develop. You must return to your health care provider within that time to have the area checked. This will determine whether you have had a significant reaction to the PPD test. A reaction is measured in millimeters of hard swelling (induration) at the site. PREPARATION FOR TEST  There is no special preparation for this test. People with a skin rash or other skin irritations on their arms may need to have the test performed at a different spot on the body. Tell your health care provider if you have ever had a positive PPD skin test. If so, you should not have a repeat PPD test. Tell your doctor if you have a medical condition or if you take certain drugs, such as steroids, that can affect your immune system. These situations  may lead to inaccurate test results. NORMAL FINDINGS A negative reaction (no induration) or a level of hard swelling that falls below a certain cutoff may mean that a person has not been infected with the bacteria that cause TB. There are different cutoffs for children, people with HIV, and other risk groups. Unfortunately, this is not a perfect test, and up to 20% of people infected with tuberculosis may not have a reaction on the PPD skin test. In addition, certain conditions that affect the immune system (cancer, recent chemotherapy, late-stage AIDS) may cause a false-negative test result.  The reaction will take 48 - 72 hours to develop. You must return to your health care provider within that time to have the area checked. Follow your caregiver's instructions as to where and when to report for this to be done. Ranges for normal findings may vary among different laboratories and hospitals. You should always check with your doctor after having lab work or other tests done to discuss the meaning of your test results and whether your values are considered within normal limits. WHAT ABNORMAL RESULTS MEAN  The results of the test depend on the size of the skin reaction and on the person being tested.  A small reaction (5 mm of hard swelling at the site) is considered to be positive in people who have HIV, who are taking steroid therapy, or who have been in close contact with a person who has active tuberculosis. Larger reactions (greater than or equal to 10 mm) are considered positive in people with diabetes or kidney failure, and in health care workers, among others. In people with no known risks for tuberculosis, a positive reaction requires 15 mm or more of hard swelling at the site. RISKS AND COMPLICATIONS There is a very small risk of severe redness and swelling of the arm in people who have had a previous positive PPD test and who have the test again. There also have been a few rare cases of this  reaction in people who have not been tested before. CONSIDERATIONS  A positive skin test does not necessarily mean that a person has active tuberculosis. More tests will be done to check whether active disease is present. Many people who were born outside the [place] may have had a vaccine called "BCG," which can lead to a false-positive test result. MEANING OF TEST  Your caregiver will go over the test results with you and discuss the importance and meaning of your results, as well as treatment options and the need for additional tests if necessary. OBTAINING THE TEST RESULTS It  is your responsibility to obtain your test results. Ask the lab or department performing the test when and how you will get your results. Document Released: 11/05/2004 Document Revised: 04/20/2011 Document Reviewed: 05/05/2013 [facility] Patient Information 2015 [place], [place]. This information is not intended to replace advice given to you by your health care provider. Make sure you discuss any questions you have with your health care provider.

## 2014-02-24 LAB — URINALYSIS, MICROSCOPIC ONLY
BACTERIA UA: NONE SEEN
Casts: NONE SEEN
Crystals: NONE SEEN
Squamous Epithelial / LPF: NONE SEEN

## 2014-02-24 LAB — URINALYSIS, ROUTINE W REFLEX MICROSCOPIC
Bilirubin Urine: NEGATIVE
HGB URINE DIPSTICK: NEGATIVE
Ketones, ur: NEGATIVE mg/dL
Leukocytes, UA: NEGATIVE
Nitrite: NEGATIVE
PH: 5.5 (ref 5.0–8.0)
Protein, ur: NEGATIVE mg/dL
Specific Gravity, Urine: >1.03 — ABNORMAL HIGH (ref 1.005–1.030)
UROBILINOGEN UA: 0.2 mg/dL (ref 0.0–1.0)

## 2014-02-24 LAB — MICROALBUMIN / CREATININE URINE RATIO
Creatinine, Urine: 68.5 mg/dL
MICROALB UR: 0.3 mg/dL (ref <2.0)
Microalb Creat Ratio: 4.4 mg/g (ref 0.0–30.0)

## 2014-02-25 ENCOUNTER — Other Ambulatory Visit: Payer: Self-pay | Admitting: Emergency Medicine

## 2014-02-25 MED ORDER — SULFAMETHOXAZOLE-TRIMETHOPRIM 800-160 MG PO TABS: 1.0000 | ORAL_TABLET | Freq: Two times a day (BID) | ORAL | Status: DC

## 2014-02-26 ENCOUNTER — Encounter: Payer: Self-pay | Admitting: Emergency Medicine

## 2014-02-26 LAB — URINE CULTURE: Colony Count: 75000

## 2014-02-26 LAB — TB SKIN TEST
Induration: 0 mm
TB SKIN TEST: NEGATIVE

## 2014-03-02 ENCOUNTER — Encounter: Payer: Self-pay | Admitting: Emergency Medicine

## 2014-03-02 DIAGNOSIS — M109 Gout, unspecified: Secondary | ICD-10-CM | POA: Insufficient documentation

## 2014-03-02 DIAGNOSIS — G4733 Obstructive sleep apnea (adult) (pediatric): Secondary | ICD-10-CM | POA: Insufficient documentation

## 2014-03-02 DIAGNOSIS — Z9989 Dependence on other enabling machines and devices: Secondary | ICD-10-CM

## 2014-03-06 ENCOUNTER — Other Ambulatory Visit: Payer: Self-pay

## 2014-03-06 MED ORDER — LISINOPRIL 10 MG PO TABS: ORAL_TABLET | ORAL | Status: DC

## 2014-03-13 ENCOUNTER — Other Ambulatory Visit: Payer: Self-pay

## 2014-03-21 ENCOUNTER — Other Ambulatory Visit: Payer: Self-pay | Admitting: *Deleted

## 2014-03-21 DIAGNOSIS — Z1212 Encounter for screening for malignant neoplasm of rectum: Secondary | ICD-10-CM

## 2014-03-21 LAB — POC HEMOCCULT BLD/STL (HOME/3-CARD/SCREEN)
Card #3 Fecal Occult Blood, POC: NEGATIVE
FECAL OCCULT BLD: NEGATIVE
Fecal Occult Blood, POC: NEGATIVE

## 2014-03-23 ENCOUNTER — Encounter: Payer: Self-pay | Admitting: Emergency Medicine

## 2014-03-23 ENCOUNTER — Ambulatory Visit (INDEPENDENT_AMBULATORY_CARE_PROVIDER_SITE_OTHER): Payer: 59 | Admitting: Emergency Medicine

## 2014-03-23 VITALS — BP 122/70 | HR 84 | Temp 98.0°F | Resp 18 | Ht 63.5 in

## 2014-03-23 DIAGNOSIS — R7989 Other specified abnormal findings of blood chemistry: Secondary | ICD-10-CM

## 2014-03-23 DIAGNOSIS — N390 Urinary tract infection, site not specified: Secondary | ICD-10-CM

## 2014-03-23 DIAGNOSIS — R945 Abnormal results of liver function studies: Principal | ICD-10-CM

## 2014-03-23 LAB — URINALYSIS, ROUTINE W REFLEX MICROSCOPIC
Bilirubin Urine: NEGATIVE
Glucose, UA: >1000 mg/dL — AB
Hgb urine dipstick: NEGATIVE
Ketones, ur: NEGATIVE mg/dL
LEUKOCYTES UA: NEGATIVE
NITRITE: NEGATIVE
PROTEIN: NEGATIVE mg/dL
Urobilinogen, UA: 0.2 mg/dL (ref 0.0–1.0)
pH: 5 (ref 5.0–8.0)

## 2014-03-23 LAB — HEPATIC FUNCTION PANEL
ALBUMIN: 4.3 g/dL (ref 3.5–5.2)
ALK PHOS: 67 U/L (ref 39–117)
ALT: 36 U/L — ABNORMAL HIGH (ref 0–35)
AST: 25 U/L (ref 0–37)
Bilirubin, Direct: 0.1 mg/dL (ref 0.0–0.3)
Indirect Bilirubin: 0.3 mg/dL (ref 0.2–1.2)
Total Bilirubin: 0.4 mg/dL (ref 0.2–1.2)
Total Protein: 6.6 g/dL (ref 6.0–8.3)

## 2014-03-23 LAB — URINALYSIS, MICROSCOPIC ONLY
Bacteria, UA: NONE SEEN
CRYSTALS: NONE SEEN
Casts: NONE SEEN
SQUAMOUS EPITHELIAL / LPF: NONE SEEN

## 2014-03-23 NOTE — Patient Instructions (Signed)
FYI  Fatty Liver Fatty liver is the accumulation of fat in liver cells. It is also called hepatosteatosis or steatohepatitis. It is normal for your liver to contain some fat. If fat is more than 5 to 10% of your liver's weight, you have fatty liver.  There are often no symptoms (problems) for years while damage is still occurring. People often learn about their fatty liver when they have medical tests for other reasons. Fat can damage your liver for years or even decades without causing problems. When it becomes severe, it can cause fatigue, weight loss, weakness, and confusion. This makes you more likely to develop more serious liver problems. The liver is the largest organ in the body. It does a lot of work and often gives no warning signs when it is sick until late in a disease. The liver has many important jobs including:  Breaking down foods.  Storing vitamins, iron, and other minerals.  Making proteins.  Making bile for food digestion.  Breaking down many products including medications, alcohol and some poisons. CAUSES  There are a number of different conditions, medications, and poisons that can cause a fatty liver. Eating too many calories causes fat to build up in the liver. Not processing and breaking fats down normally may also cause this. Certain conditions, such as obesity, diabetes, and high triglycerides also cause this. Most fatty liver patients tend to be middle-aged and over weight.  Some causes of fatty liver are:  Alcohol over consumption.  Malnutrition.  Steroid use.  Valproic acid toxicity.  Obesity.  Cushing's syndrome.  Poisons.  Tetracycline in high dosages.  Pregnancy.  Diabetes.  Hyperlipidemia.  Rapid weight loss. Some people develop fatty liver even having none of these conditions. SYMPTOMS  Fatty liver most often causes no problems. This is called asymptomatic.  It can be diagnosed with blood tests and also by a liver biopsy.  It is one  of the most common causes of minor elevations of liver enzymes on routine blood tests.  Specialized Imaging of the liver using ultrasound, CT (computed tomography) scan, or MRI (magnetic resonance imaging) can suggest a fatty liver but a biopsy is needed to confirm it.  A biopsy involves taking a small sample of liver tissue. This is done by using a needle. It is then looked at under a microscope by a specialist. TREATMENT  It is important to treat the cause. Simple fatty liver without a medical reason may not need treatment.  Weight loss, fat restriction, and exercise in overweight patients produces inconsistent results but is worth trying.  Fatty liver due to alcohol toxicity may not improve even with stopping drinking.  Good control of diabetes may reduce fatty liver.  Lower your triglycerides through diet, medication or both.  Eat a balanced, healthy diet.  Increase your physical activity.  Get regular checkups from a liver specialist.  There are no medical or surgical treatments for a fatty liver or NASH, but improving your diet and increasing your exercise may help prevent or reverse some of the damage. PROGNOSIS  Fatty liver may cause no damage or it can lead to an inflammation of the liver. This is, called steatohepatitis. When it is linked to alcohol abuse, it is called alcoholic steatohepatitis. It often is not linked to alcohol. It is then called nonalcoholic steatohepatitis, or NASH. Over time the liver may become scarred and hardened. This condition is called cirrhosis. Cirrhosis is serious and may lead to liver failure or cancer. NASH is one  of the leading causes of cirrhosis. About 10-20% of Americans have fatty liver and a smaller 2-5% has NASH. Document Released: 03/13/2005 Document Revised: 04/20/2011 Document Reviewed: 06/07/2013 [facility] Patient Information 2015 [place], [place]. This information is not intended to replace advice given to you by your health care provider.  Make sure you discuss any questions you have with your health care provider.

## 2014-03-23 NOTE — Progress Notes (Signed)
Subjective:    Patient ID: Amanda Cooper, female    DOB: 08-24-1955, 59 y.o.   MRN: [id number]  HPI Comments: 59 yo WF close f/u for elevated liver labs and recent UTI. She was treated with Bactrim for UTI and denies any residual symptoms. She has not had any abdomen pain or change in BM with elevated liver lab. She denies ETOH/ DRUG use and rarely takes pain medications. She denies any other recent illness.      Component                Value               Date                      WBC                      5.7                 02/23/2014                HGB                      15.0                02/23/2014                HCT                      45.5                02/23/2014                PLT                      211                 02/23/2014                GLUCOSE                  128*                02/23/2014                CHOL                     142                 01/03/2014                TRIG                     208*                01/03/2014                HDL                      42                  01/03/2014                LDLCALC                  58  01/03/2014                ALT                      49*                 02/23/2014                AST                      36                  02/23/2014                NA                       140                 02/23/2014                K                        4.2                 02/23/2014                CL                       102                 02/23/2014                CREATININE               0.70                02/23/2014                BUN                      14                  02/23/2014                CO2                      27                  02/23/2014                TSH                      1.112               01/03/2014                HGBA1C                   7.8*                01/03/2014                MICROALBUR               0.3                 02/23/2014  Medication List       This list is accurate as of: 03/23/14  8:53 AM.  Always use your most recent med list.               aspirin 81 MG tablet  Take 81 mg by mouth daily.     CALCIUM 1200 PO  Take by mouth daily.     Canagliflozin-Metformin HCl [id number] MG Tabs  Commonly known as:  INVOKAMET  Take one tablet twice daily     cyclobenzaprine 10 MG tablet  Commonly known as:  FLEXERIL  1-2 at night for headache/TMJ     Fish Oil 1000 MG Caps  Take by mouth daily.     fluconazole 150 MG tablet  Commonly known as:  DIFLUCAN  Take 1 tablet (150 mg total) by mouth daily.     glimepiride 4 MG tablet  Commonly known as:  AMARYL  Take 1 tablet (4 mg total) by mouth 2 (two) times daily.     lisinopril 10 MG tablet  Commonly known as:  PRINIVIL,ZESTRIL  TAKE 1 TABLET (10 MG TOTAL) BY MOUTH DAILY.     Magnesium 250 MG Tabs  Take by mouth daily.     multivitamin capsule  Take 1 capsule by mouth daily.     vitamin B-12 1000 MCG tablet  Commonly known as:  CYANOCOBALAMIN  Take 1,000 mcg by mouth daily.     Vitamin D 2000 UNITS tablet  Take 2,000 Units by mouth 2 (two) times daily.       Allergies  Allergen Reactions  . Clinoril [Sulindac]   . Erythromycin    Past Medical History  Diagnosis Date  . Hyperlipidemia   . Colon polyps   . Diabetes mellitus without complication   . Anemia   . Anxiety   . Hypertension   . Depression, controlled   . Gout   . Obstructive sleep apnea on CPAP      Review of Systems  Constitutional: Positive for fever and fatigue.  Gastrointestinal: Negative for abdominal pain.  Genitourinary: Negative for dysuria and hematuria.  All other systems reviewed and are negative.  BP 122/70 mmHg  Pulse 84  Temp(Src) 98 F (36.7 C) (Temporal)  Resp 18  Ht 5' 3.5" (1.613 m)     Objective:   Physical Exam  Constitutional: She is oriented to person, place, and time. She appears well-developed and well-nourished.  HENT:  Head:  Normocephalic.  Eyes: Conjunctivae are normal. No scleral icterus.  Neck: Normal range of motion.  Cardiovascular: Normal rate, regular rhythm, normal heart sounds and intact distal pulses.   Pulmonary/Chest: Effort normal and breath sounds normal.  Abdominal: Soft. Bowel sounds are normal. She exhibits no distension and no mass. There is no tenderness. There is no rebound and no guarding.  Musculoskeletal: Normal range of motion.  Neurological: She is alert and oriented to person, place, and time.  Skin: Skin is warm and dry. No rash noted.  Psychiatric: She has a normal mood and affect. Judgment normal.  Nursing note and vitals reviewed.       Assessment & Plan:  1.Elevated liver functions- Recheck labs, long discussion about possible reasons for elevation and need for ultrasound if still elevated.  2.  UTI with hematuria recent- Recheck labs, push H2O Hygiene explained

## 2014-03-24 LAB — URINE CULTURE
COLONY COUNT: NO GROWTH
Organism ID, Bacteria: NO GROWTH

## 2014-04-19 ENCOUNTER — Encounter: Payer: Self-pay | Admitting: Emergency Medicine

## 2014-04-23 ENCOUNTER — Other Ambulatory Visit: Payer: 59

## 2014-04-23 DIAGNOSIS — E119 Type 2 diabetes mellitus without complications: Secondary | ICD-10-CM

## 2014-04-23 LAB — HEMOGLOBIN A1C
Hgb A1c MFr Bld: 7.4 % — ABNORMAL HIGH (ref <5.7)
MEAN PLASMA GLUCOSE: 166 mg/dL — AB (ref <117)

## 2014-05-16 ENCOUNTER — Other Ambulatory Visit: Payer: Self-pay

## 2014-05-16 ENCOUNTER — Encounter: Payer: Self-pay | Admitting: Internal Medicine

## 2014-05-16 ENCOUNTER — Encounter: Payer: Self-pay | Admitting: Emergency Medicine

## 2014-05-16 MED ORDER — PHENTERMINE HCL 37.5 MG PO TABS: ORAL_TABLET | ORAL | Status: DC

## 2014-05-28 ENCOUNTER — Other Ambulatory Visit: Payer: Self-pay | Admitting: *Deleted

## 2014-05-28 MED ORDER — CANAGLIFLOZIN-METFORMIN HCL 150-1000 MG PO TABS: ORAL_TABLET | ORAL | Status: DC

## 2014-11-15 ENCOUNTER — Encounter: Payer: Self-pay | Admitting: Physician Assistant

## 2014-11-15 ENCOUNTER — Ambulatory Visit (INDEPENDENT_AMBULATORY_CARE_PROVIDER_SITE_OTHER): Payer: 59 | Admitting: Physician Assistant

## 2014-11-15 VITALS — BP 100/60 | HR 78 | Temp 97.3°F | Resp 14 | Ht 63.5 in | Wt 189.0 lb

## 2014-11-15 DIAGNOSIS — E785 Hyperlipidemia, unspecified: Secondary | ICD-10-CM

## 2014-11-15 DIAGNOSIS — E1122 Type 2 diabetes mellitus with diabetic chronic kidney disease: Secondary | ICD-10-CM

## 2014-11-15 DIAGNOSIS — I10 Essential (primary) hypertension: Secondary | ICD-10-CM

## 2014-11-15 DIAGNOSIS — Z9989 Dependence on other enabling machines and devices: Secondary | ICD-10-CM

## 2014-11-15 DIAGNOSIS — G4733 Obstructive sleep apnea (adult) (pediatric): Secondary | ICD-10-CM

## 2014-11-15 DIAGNOSIS — Z79899 Other long term (current) drug therapy: Secondary | ICD-10-CM

## 2014-11-15 DIAGNOSIS — E559 Vitamin D deficiency, unspecified: Secondary | ICD-10-CM | POA: Insufficient documentation

## 2014-11-15 DIAGNOSIS — N182 Chronic kidney disease, stage 2 (mild): Secondary | ICD-10-CM

## 2014-11-15 LAB — CBC WITH DIFFERENTIAL/PLATELET
BASOS ABS: 0.1 10*3/uL (ref 0.0–0.1)
BASOS PCT: 1 % (ref 0–1)
EOS ABS: 0.3 10*3/uL (ref 0.0–0.7)
EOS PCT: 5 % (ref 0–5)
HCT: 44.3 % (ref 36.0–46.0)
Hemoglobin: 14.8 g/dL (ref 12.0–15.0)
LYMPHS ABS: 1.9 10*3/uL (ref 0.7–4.0)
Lymphocytes Relative: 34 % (ref 12–46)
MCH: 31.6 pg (ref 26.0–34.0)
MCHC: 33.4 g/dL (ref 30.0–36.0)
MCV: 94.5 fL (ref 78.0–100.0)
MPV: 10.2 fL (ref 8.6–12.4)
Monocytes Absolute: 0.3 10*3/uL (ref 0.1–1.0)
Monocytes Relative: 5 % (ref 3–12)
NEUTROS PCT: 55 % (ref 43–77)
Neutro Abs: 3 10*3/uL (ref 1.7–7.7)
PLATELETS: 199 10*3/uL (ref 150–400)
RBC: 4.69 MIL/uL (ref 3.87–5.11)
RDW: 13.9 % (ref 11.5–15.5)
WBC: 5.5 10*3/uL (ref 4.0–10.5)

## 2014-11-15 LAB — MAGNESIUM: Magnesium: 2 mg/dL (ref 1.5–2.5)

## 2014-11-15 LAB — BASIC METABOLIC PANEL WITH GFR
BUN: 14 mg/dL (ref 7–25)
CHLORIDE: 103 mmol/L (ref 98–110)
CO2: 26 mmol/L (ref 20–31)
CREATININE: 0.64 mg/dL (ref 0.50–1.05)
Calcium: 9.5 mg/dL (ref 8.6–10.4)
GFR, Est Non African American: >89 mL/min (ref >=60)
Glucose, Bld: 209 mg/dL — ABNORMAL HIGH (ref 65–99)
POTASSIUM: 5 mmol/L (ref 3.5–5.3)
SODIUM: 141 mmol/L (ref 135–146)

## 2014-11-15 LAB — HEPATIC FUNCTION PANEL
ALBUMIN: 4.4 g/dL (ref 3.6–5.1)
ALK PHOS: 84 U/L (ref 33–130)
ALT: 32 U/L — ABNORMAL HIGH (ref 6–29)
AST: 25 U/L (ref 10–35)
BILIRUBIN DIRECT: 0.1 mg/dL (ref <=0.2)
BILIRUBIN TOTAL: 0.5 mg/dL (ref 0.2–1.2)
Indirect Bilirubin: 0.4 mg/dL (ref 0.2–1.2)
Total Protein: 6.6 g/dL (ref 6.1–8.1)

## 2014-11-15 LAB — LIPID PANEL
CHOL/HDL RATIO: 5.3 ratio — AB (ref <=5.0)
Cholesterol: 139 mg/dL (ref 125–200)
HDL: 26 mg/dL — AB (ref >=46)
LDL Cholesterol: 50 mg/dL (ref <130)
TRIGLYCERIDES: 316 mg/dL — AB (ref <150)
VLDL: 63 mg/dL — ABNORMAL HIGH (ref <30)

## 2014-11-15 LAB — TSH: TSH: 0.669 u[IU]/mL (ref 0.350–4.500)

## 2014-11-15 MED ORDER — PHENTERMINE HCL 37.5 MG PO TABS: ORAL_TABLET | ORAL | Status: DC

## 2014-11-15 MED ORDER — LINAGLIPTIN 5 MG PO TABS: 5.0000 mg | ORAL_TABLET | Freq: Every day | ORAL | Status: DC

## 2014-11-15 NOTE — Progress Notes (Signed)
Assessment and Plan:  Hypertension: Continue medication, monitor blood pressure at home. Continue DASH diet.  Reminder to go to the ER if any CP, SOB, nausea, dizziness, severe HA, changes vision/speech, left arm numbness and tingling and jaw pain. Cholesterol: Continue diet and exercise. Check cholesterol.  Diabetes-Continue diet and exercise. Check A1C, start DDP in addition to invokana and follow up 1 month with sugars for DM education, if not better will adjust meds.  Vitamin D Def- check level and continue medications.  Obesity with co morbidities- long discussion about weight loss, diet, and exercise Stress incontinence- can be due to elevated sugars, decrease sugars, bladder matters given, if not better will try medication   Continue diet and meds as discussed. Further disposition pending results of labs. Discussed med's effects and SE's.   HPI 59 y.o. female  presents for 3 month follow up with hypertension, hyperlipidemia, diabetes and vitamin D. Her blood pressure has been controlled at home, today their BP is BP: 100/60 mmHg She does not workout. She denies chest pain, shortness of breath, dizziness.  She is not on cholesterol medication and denies myalgias. Her cholesterol is at goal. The cholesterol was:  01/03/2014: Cholesterol 142; HDL 42; LDL Cholesterol 58; Triglycerides 208* She has been working on diet and exercise for Diabetes, she has been checking her sugars at home running 200;s fasting, she is on Invokamet 150/1000mg  twice a day, and she is off glimeperide, she is on ASA and she is on ACE/ARB, and having polydipsia, polyuria and denies paresthesia of the feet and visual disturbances. Last A1C  was: 04/23/2014: Hgb A1c MFr Bld 7.4*  Lab Results  Component Value Date   GFRNONAA >89 02/23/2014   Patient is on Vitamin D supplement. She was on allopurinol for gout but last time she was not on it and last uric acid was   01/03/2014: Vit D, 25-Hydroxy 50  BMI is Body mass  index is 32.95 kg/(m^2)., she is working on diet and exercise and has done well.  Wt Readings from Last 3 Encounters:  11/15/14 189 lb (85.73 kg)  02/23/14 197 lb (89.359 kg)  01/03/14 200 lb (90.719 kg)    Current Medications:  Current Outpatient Prescriptions on File Prior to Visit  Medication Sig Dispense Refill  . aspirin 81 MG tablet Take 81 mg by mouth daily.    . Calcium Carbonate-Vit D-Min (CALCIUM 1200 PO) Take by mouth daily.    . Canagliflozin-Metformin HCl (INVOKAMET) [id number] MG TABS Take one tablet twice daily 180 tablet 3  . Cholecalciferol (VITAMIN D) 2000 UNITS tablet Take 2,000 Units by mouth 2 (two) times daily.    . cyclobenzaprine (FLEXERIL) 10 MG tablet 1-2 at night for headache/TMJ 60 tablet 1  . fluconazole (DIFLUCAN) 150 MG tablet Take 1 tablet (150 mg total) by mouth daily. 1 tablet 3  . lisinopril (PRINIVIL,ZESTRIL) 10 MG tablet TAKE 1 TABLET (10 MG TOTAL) BY MOUTH DAILY. 90 tablet 3  . Magnesium 250 MG TABS Take by mouth daily.    . Multiple Vitamin (MULTIVITAMIN) capsule Take 1 capsule by mouth daily.    . Omega-3 Fatty Acids (FISH OIL) 1000 MG CAPS Take by mouth daily.    . phentermine (ADIPEX-P) 37.5 MG tablet Take 1/2 to 1 tablet daily before breakfast for weight loss 30 tablet 2  . vitamin B-12 (CYANOCOBALAMIN) 1000 MCG tablet Take 1,000 mcg by mouth daily.     No current facility-administered medications on file prior to visit.   Medical History:  Past Medical History  Diagnosis Date  . Hyperlipidemia   . Colon polyps   . Diabetes mellitus without complication ([place])   . Anemia   . Anxiety   . Hypertension   . Depression, controlled   . Gout   . Obstructive sleep apnea on CPAP    Allergies:  Allergies  Allergen Reactions  . Clinoril [Sulindac]   . Erythromycin     Review of Systems  Constitutional: Negative.   HENT: Negative.   Eyes: Negative.   Respiratory: Negative.   Cardiovascular: Negative.   Gastrointestinal: Negative.    Genitourinary: Positive for frequency. Negative for dysuria, urgency, hematuria and flank pain.       Stress incontinence  Musculoskeletal: Negative.   Skin: Negative.   Neurological: Negative.   Endo/Heme/Allergies: Positive for polydipsia. Negative for environmental allergies. Does not bruise/bleed easily.  Psychiatric/Behavioral: Negative.      Family history- Review and unchanged Social history- Review and unchanged Physical Exam: BP 100/60 mmHg  Pulse 78  Temp(Src) 97.3 F (36.3 C) (Temporal)  Resp 14  Ht 5' 3.5" (1.613 m)  Wt 189 lb (85.73 kg)  BMI 32.95 kg/m2  SpO2 97% Wt Readings from Last 3 Encounters:  11/15/14 189 lb (85.73 kg)  02/23/14 197 lb (89.359 kg)  01/03/14 200 lb (90.719 kg)   General Appearance: Well nourished, in no apparent distress. Eyes: PERRLA, EOMs, conjunctiva no swelling or erythema Sinuses: No Frontal/maxillary tenderness ENT/Mouth: Ext aud canals clear, TMs without erythema, bulging. No erythema, swelling, or exudate on post pharynx.  Tonsils not swollen or erythematous. Hearing normal.  Neck: Supple, thyroid normal.  Respiratory: Respiratory effort normal, BS equal bilaterally without rales, rhonchi, wheezing or stridor.  Cardio: RRR with no MRGs. Brisk peripheral pulses without edema.  Abdomen: Soft, + BS.  Non tender, no guarding, rebound, hernias, masses. Lymphatics: Non tender without lymphadenopathy.  Musculoskeletal: Full ROM, 5/5 strength, normal gait.  Skin: Warm, dry without rashes, lesions, ecchymosis.  Neuro: Cranial nerves intact. No cerebellar symptoms. Sensation intact.  Psych: Awake and oriented X 3, normal affect, Insight and Judgment appropriate.    Vicie Mutters, PA-C 9:38 AM [facility]

## 2014-11-15 NOTE — Patient Instructions (Signed)
Diabetes is a very complicated disease...lets simplify it.  An easy way to look at it to understand the complications is if you think of the extra sugar floating in your blood stream as glass shards floating through your blood stream.    Diabetes affects your small vessels first: 1) The glass shards (sugar) scraps down the tiny blood vessels in your eyes and lead to diabetic retinopathy, the leading cause of blindness in the [place]. Diabetes is the leading cause of newly diagnosed adult (20 to 59 years of age) blindness in the [place].  2) The glass shards scratches down the tiny vessels of your legs leading to nerve damage called neuropathy and can lead to amputations of your feet. More than 60% of all non-traumatic amputations of lower limbs occur in people with diabetes.  3) Over time the small vessels in your brain are shredded and closed off, individually this does not cause any problems but over a long period of time many of the small vessels being blocked can lead to Vascular Dementia.   4) Your kidney's are a filter system and have a "net" that keeps certain things in the body and lets bad things out. Sugar shreds this net and leads to kidney damage and eventually failure. Decreasing the sugar that is destroying the net and certain blood pressure medications can help stop or decrease progression of kidney disease. Diabetes was the primary cause of kidney failure in 44 percent of all new cases in 2011.  5) Diabetes also destroys the small vessels in your penis that lead to erectile dysfunction. Eventually the vessels are so damaged that you may not be responsive to cialis or viagra.   Diabetes and your large vessels: Your larger vessels consist of your coronary arteries in your heart and the carotid vessels to your brain. Diabetes or even increased sugars put you at 300% increased risk of heart attack and stroke and this is why.. The sugar scrapes down your large blood vessels and your body  sees this as an internal injury and tries to repair itself. Just like you get a scab on your skin, your platelets will stick to the blood vessel wall trying to heal it. This is why we have diabetics on low dose aspirin daily, this prevents the platelets from sticking and can prevent plaque formation. In addition, your body takes cholesterol and tries to shove it into the open wound. This is why we want your LDL, or bad cholesterol, below 70.   The combination of platelets and cholesterol over 5-10 years forms plaque that can break off and cause a heart attack or stroke.   PLEASE REMEMBER:  Diabetes is preventable! Up to 85 percent of complications and morbidities among individuals with type 2 diabetes can be prevented, delayed, or effectively treated and minimized with regular visits to a health professional, appropriate monitoring and medication, and a healthy diet and lifestyle.     Bad carbs also include fruit juice, alcohol, and sweet tea. These are empty calories that do not signal to your brain that you are full.   Please remember the good carbs are still carbs which convert into sugar. So please measure them out no more than 1/2-1 cup of rice, oatmeal, pasta, and beans  Veggies are however free foods! Pile them on.   Not all fruit is created equal. Please see the list below, the fruit at the bottom is higher in sugars than the fruit at the top. Please avoid all dried fruits.       We want weight loss that will last so you should lose 1-2 pounds a week.  THAT IS IT! Please pick THREE things a month to change. Once it is a habit check off the item. Then pick another three items off the list to become habits.  If you are already doing a habit on the list GREAT!  Cross that item off! o Don't drink your calories. Ie, alcohol, soda, fruit juice, and sweet tea.  o Drink more water. Drink a glass when you feel hungry or before each meal.  o Eat breakfast - Complex carb and protein (likeDannon  light and fit yogurt, oatmeal, fruit, eggs, turkey bacon). o Measure your cereal.  Eat no more than one cup a day. (ie Kashi) o Eat an apple a day. o Add a vegetable a day. o Try a new vegetable a month. o Use Pam! Stop using oil or butter to cook. o Don't finish your plate or use smaller plates. o Share your dessert. o Eat sugar free Jello for dessert or frozen grapes. o Don't eat 2-3 hours before bed. o Switch to whole wheat bread, pasta, and brown rice. o Make healthier choices when you eat out. No fries! o Pick baked chicken, NOT fried. o Don't forget to SLOW DOWN when you eat. It is not going anywhere.  o Take the stairs. o Park far away in the parking lot o Lift soup cans (or weights) for 10 minutes while watching TV. o Walk at work for 10 minutes during break. o Walk outside 1 time a week with your friend, kids, dog, or significant other. o Start a walking group at church. o Walk the mall as much as you can tolerate.  o Keep a food diary. o Weigh yourself daily. o Walk for 15 minutes 3 days per week. o Cook at home more often and eat out less.  If life happens and you go back to old habits, it is okay.  Just start over. You can do it!   If you experience chest pain, get short of breath, or tired during the exercise, please stop immediately and inform your doctor.   

## 2014-11-16 LAB — VITAMIN D 25 HYDROXY (VIT D DEFICIENCY, FRACTURES): Vit D, 25-Hydroxy: 47 ng/mL (ref 30–100)

## 2014-11-16 LAB — HEMOGLOBIN A1C
Hgb A1c MFr Bld: 9.2 % — ABNORMAL HIGH (ref <5.7)
Mean Plasma Glucose: 217 mg/dL — ABNORMAL HIGH (ref <117)

## 2014-11-17 ENCOUNTER — Encounter: Payer: Self-pay | Admitting: *Deleted

## 2014-12-17 ENCOUNTER — Encounter: Payer: Self-pay | Admitting: *Deleted

## 2014-12-17 NOTE — Telephone Encounter (Signed)
This encounter was created in error - please disregard.

## 2014-12-19 ENCOUNTER — Encounter: Payer: Self-pay | Admitting: Physician Assistant

## 2014-12-19 ENCOUNTER — Ambulatory Visit (INDEPENDENT_AMBULATORY_CARE_PROVIDER_SITE_OTHER): Payer: 59 | Admitting: Physician Assistant

## 2014-12-19 VITALS — BP 120/70 | HR 80 | Temp 97.0°F | Resp 16 | Ht 63.5 in | Wt 190.0 lb

## 2014-12-19 DIAGNOSIS — E785 Hyperlipidemia, unspecified: Secondary | ICD-10-CM | POA: Diagnosis not present

## 2014-12-19 DIAGNOSIS — N182 Chronic kidney disease, stage 2 (mild): Secondary | ICD-10-CM

## 2014-12-19 DIAGNOSIS — I10 Essential (primary) hypertension: Secondary | ICD-10-CM

## 2014-12-19 DIAGNOSIS — E1122 Type 2 diabetes mellitus with diabetic chronic kidney disease: Secondary | ICD-10-CM

## 2014-12-19 MED ORDER — GLIMEPIRIDE 4 MG PO TABS: 4.0000 mg | ORAL_TABLET | Freq: Every day | ORAL | Status: DC

## 2014-12-19 MED ORDER — LINAGLIPTIN 5 MG PO TABS: 5.0000 mg | ORAL_TABLET | Freq: Every day | ORAL | Status: DC

## 2014-12-19 MED ORDER — MECLIZINE HCL 25 MG PO TABS: ORAL_TABLET | ORAL | Status: DC

## 2014-12-19 MED ORDER — DAPAGLIFLOZIN PRO-METFORMIN ER 5-1000 MG PO TB24: 5.0000 mg | ORAL_TABLET | Freq: Two times a day (BID) | ORAL | Status: DC

## 2014-12-19 MED ORDER — SCOPOLAMINE 1 MG/3DAYS TD PT72: 1.0000 | MEDICATED_PATCH | TRANSDERMAL | Status: DC

## 2014-12-19 NOTE — Patient Instructions (Addendum)
Stop the invokamet, start the farxiga 06/998 Continue the tranjenta Take 1 of the glimeperide in the morning if your sugar is above 200, take 1/2 if your sugar is above 150 in the AM.      Bad carbs also include fruit juice, alcohol, and sweet tea. These are empty calories that do not signal to your brain that you are full.   Please remember the good carbs are still carbs which convert into sugar. So please measure them out no more than 1/2-1 cup of rice, oatmeal, pasta, and beans  Veggies are however free foods! Pile them on.   Not all fruit is created equal. Please see the list below, the fruit at the bottom is higher in sugars than the fruit at the top. Please avoid all dried fruits.     If your morning sugar is always below 100 then the issue is with your sugar spiking after meals. Try to take your blood sugar approximately 2 hours after eating, this number should be less than 200. If it is not, think about the foods that you ate and better choices you can make.   We want weight loss that will last so you should lose 1-2 pounds a week.  THAT IS IT! Please pick THREE things a month to change. Once it is a habit check off the item. Then pick another three items off the list to become habits.  If you are already doing a habit on the list GREAT!  Cross that item off! o Don't drink your calories. Ie, alcohol, soda, fruit juice, and sweet tea.  o Drink more water. Drink a glass when you feel hungry or before each meal.  o Eat breakfast - Complex carb and protein (likeDannon light and fit yogurt, oatmeal, fruit, eggs, [place] bacon). o Measure your cereal.  Eat no more than one cup a day. (ie [place]) o Eat an apple a day. o Add a vegetable a day. o Try a new vegetable a month. o Use Pam! Stop using oil or butter to cook. o Don't finish your plate or use smaller plates. o Share your dessert. o Eat sugar free Jello for dessert or frozen grapes. o Don't eat 2-3 hours before bed. o Switch to  whole wheat bread, pasta, and brown rice. o Make healthier choices when you eat out. No fries! o Pick baked chicken, NOT fried. o Don't forget to SLOW DOWN when you eat. It is not going anywhere.  o Take the stairs. o Park far away in the parking lot o [employer] (or weights) for 10 minutes while watching TV. o Walk at work for 10 minutes during break. o Walk outside 1 time a week with your friend, kids, dog, or significant other. o Start a walking group at [place] the mall as much as you can tolerate.  o Keep a food diary. o Weigh yourself daily. o Walk for 15 minutes 3 days per week. o Cook at home more often and eat out less.  If life happens and you go back to old habits, it is okay.  Just start over. You can do it!   If you experience chest pain, get short of breath, or tired during the exercise, please stop immediately and inform your doctor.   Diabetes is a very complicated disease...lets simplify it.  An easy way to look at it to understand the complications is if you think of the extra sugar floating in your blood stream as glass  shards floating through your blood stream.    Diabetes affects your small vessels first: 1) The glass shards (sugar) scraps down the tiny blood vessels in your eyes and lead to diabetic retinopathy, the leading cause of blindness in the [place]. Diabetes is the leading cause of newly diagnosed adult (60 to 59 years of age) blindness in the [place].  2) The glass shards scratches down the tiny vessels of your legs leading to nerve damage called neuropathy and can lead to amputations of your feet. More than 60% of all non-traumatic amputations of lower limbs occur in people with diabetes.  3) Over time the small vessels in your brain are shredded and closed off, individually this does not cause any problems but over a long period of time many of the small vessels being blocked can lead to Vascular Dementia.   4) Your kidney's are a filter  system and have a "net" that keeps certain things in the body and lets bad things out. Sugar shreds this net and leads to kidney damage and eventually failure. Decreasing the sugar that is destroying the net and certain blood pressure medications can help stop or decrease progression of kidney disease. Diabetes was the primary cause of kidney failure in 44 percent of all new cases in 2011.  5) Diabetes also destroys the small vessels in your penis that lead to erectile dysfunction. Eventually the vessels are so damaged that you may not be responsive to cialis or viagra.   Diabetes and your large vessels: Your larger vessels consist of your coronary arteries in your heart and the carotid vessels to your brain. Diabetes or even increased sugars put you at 300% increased risk of heart attack and stroke and this is why.. The sugar scrapes down your large blood vessels and your body sees this as an internal injury and tries to repair itself. Just like you get a scab on your skin, your platelets will stick to the blood vessel wall trying to heal it. This is why we have diabetics on low dose aspirin daily, this prevents the platelets from sticking and can prevent plaque formation. In addition, your body takes cholesterol and tries to shove it into the open wound. This is why we want your LDL, or bad cholesterol, below 70.   The combination of platelets and cholesterol over 5-10 years forms plaque that can break off and cause a heart attack or stroke.   PLEASE REMEMBER:  Diabetes is preventable! Up to 10 percent of complications and morbidities among individuals with type 2 diabetes can be prevented, delayed, or effectively treated and minimized with regular visits to a health professional, appropriate monitoring and medication, and a healthy diet and lifestyle.

## 2014-12-19 NOTE — Progress Notes (Signed)
Diabetes Education and Follow-Up Visit  59 y.o.female presents for diabetic education, last A1C went from 7.4 to 9.2. She is on invokamet and tradjenta, she won't be able to get invokamet with insurance, will swtich to farixa/metformin combo.  She complains of hyperglycemia. Patient denies paresthesia of the feet, polydipsia, polyuria and visual disturbances. The patient is on an ACE/ARB, she is on a low dose aspirin at this time. The patient does exercise. She does not currently smoke, and does not currently drink alcohol.  The patient is checking her sugars at home.   Home BG Monitoring:  Checking 3 times a day. Average:  210  High: 260  Low:  160 Current Outpatient Prescriptions on File Prior to Visit  Medication Sig Dispense Refill  . aspirin 81 MG tablet Take 81 mg by mouth daily.    . Calcium Carbonate-Vit D-Min (CALCIUM 1200 PO) Take by mouth daily.    . Canagliflozin-Metformin HCl (INVOKAMET) 9346122316 MG TABS Take one tablet twice daily 180 tablet 3  . Cholecalciferol (VITAMIN D) 2000 UNITS tablet Take 2,000 Units by mouth 2 (two) times daily.    . cyclobenzaprine (FLEXERIL) 10 MG tablet 1-2 at night for headache/TMJ 60 tablet 1  . fluconazole (DIFLUCAN) 150 MG tablet Take 1 tablet (150 mg total) by mouth daily. 1 tablet 3  . linagliptin (TRADJENTA) 5 MG TABS tablet Take 1 tablet (5 mg total) by mouth daily. 30 tablet 4  . lisinopril (PRINIVIL,ZESTRIL) 10 MG tablet TAKE 1 TABLET (10 MG TOTAL) BY MOUTH DAILY. 90 tablet 3  . Magnesium 250 MG TABS Take by mouth daily.    . Multiple Vitamin (MULTIVITAMIN) capsule Take 1 capsule by mouth daily.    . Omega-3 Fatty Acids (FISH OIL) 1000 MG CAPS Take by mouth daily.    . phentermine (ADIPEX-P) 37.5 MG tablet Take 1/2 to 1 tablet daily before breakfast for weight loss 30 tablet 2  . vitamin B-12 (CYANOCOBALAMIN) 1000 MCG tablet Take 1,000 mcg by mouth daily.     No current facility-administered medications on file prior to visit.   Past  Medical History  Diagnosis Date  . Hyperlipidemia   . Colon polyps   . Diabetes mellitus without complication ([place])   . Anemia   . Anxiety   . Hypertension   . Depression, controlled   . Gout   . Obstructive sleep apnea on CPAP    ROS: no polyuria or polydipsia, no chest pain, dyspnea or TIA's, no numbness, tingling or pain in extremities  Physical Exam: Blood pressure 120/70, pulse 80, temperature 97 F (36.1 C), temperature source Temporal, resp. rate 16, height 5' 3.5" (1.613 m), weight 190 lb (86.183 kg), SpO2 98 %. Body mass index is 33.12 kg/(m^2). General Appearance:  alert, oriented, no acute distress and obese heart sounds regular rate and rhythm, S1, S2 normal, no murmur, click, rub or gallop, chest clear, no hepatosplenomegaly, no carotid bruits  Labs: Lab Results  Component Value Date   HGBA1C 9.2* 11/15/2014    Lab Results  Component Value Date   MICROALBUR 0.3 02/23/2014    Lab Results  Component Value Date   CHOL 139 11/15/2014   HDL 26* 11/15/2014   LDLCALC 50 11/15/2014   TRIG 316* 11/15/2014   CHOLHDL 5.3* 11/15/2014    Wt Readings from Last 3 Encounters:  12/19/14 190 lb (86.183 kg)  11/15/14 189 lb (85.73 kg)  02/23/14 197 lb (89.359 kg)     Lab Results  Component Value Date  GFRNONAA >89 11/15/2014    Plan and Assessment: Diabetes Mellitus type 2:  Diabetes mellitus Type II, under fair control. Education: Reviewed 'ABCs' of diabetes management (respective goals in parentheses):  A1C (<7), blood pressure (<130/80), and cholesterol (LDL <100) Eye Exam yearly and Dental Exam every 6 months. Dietary recommendations Physical Activity recommendations Switch to farxiga/metformin Continue trajdenta Add on glimeperide   Compliance at present is estimated to be good. Efforts to improve compliance (if necessary) will be directed at dietary modifications: decrease carbs, keep food diary and increased exercise.   Blood pressure: normal blood  pressure.   An ACE/ARB is currently part of their treatment regimen.  LDL goal of < 100, HDL > 40 and TG < 150. Dyslipidemia under fair control. A statin is not currently part of their treatment regimen.  Future Appointments Date Time Provider [place]  02/27/2015 9:00 AM Vicie Mutters, PA-C [facility] None

## 2015-01-31 ENCOUNTER — Other Ambulatory Visit: Payer: Self-pay | Admitting: Physician Assistant

## 2015-02-10 HISTORY — PX: SHOULDER ARTHROSCOPY: SHX128

## 2015-02-26 ENCOUNTER — Encounter: Payer: Self-pay | Admitting: Emergency Medicine

## 2015-02-27 ENCOUNTER — Encounter: Payer: Self-pay | Admitting: Physician Assistant

## 2015-02-27 ENCOUNTER — Ambulatory Visit: Payer: 59 | Admitting: Physician Assistant

## 2015-02-27 VITALS — BP 124/66 | HR 94 | Temp 98.1°F | Resp 16 | Ht 63.5 in | Wt 189.0 lb

## 2015-02-27 DIAGNOSIS — F419 Anxiety disorder, unspecified: Secondary | ICD-10-CM

## 2015-02-27 DIAGNOSIS — F329 Major depressive disorder, single episode, unspecified: Secondary | ICD-10-CM

## 2015-02-27 DIAGNOSIS — Z79899 Other long term (current) drug therapy: Secondary | ICD-10-CM

## 2015-02-27 DIAGNOSIS — F32A Depression, unspecified: Secondary | ICD-10-CM

## 2015-02-27 DIAGNOSIS — N182 Chronic kidney disease, stage 2 (mild): Secondary | ICD-10-CM

## 2015-02-27 DIAGNOSIS — M109 Gout, unspecified: Secondary | ICD-10-CM

## 2015-02-27 DIAGNOSIS — D649 Anemia, unspecified: Secondary | ICD-10-CM

## 2015-02-27 DIAGNOSIS — E1122 Type 2 diabetes mellitus with diabetic chronic kidney disease: Secondary | ICD-10-CM

## 2015-02-27 DIAGNOSIS — K635 Polyp of colon: Secondary | ICD-10-CM

## 2015-02-27 DIAGNOSIS — Z0001 Encounter for general adult medical examination with abnormal findings: Secondary | ICD-10-CM

## 2015-02-27 DIAGNOSIS — E785 Hyperlipidemia, unspecified: Secondary | ICD-10-CM

## 2015-02-27 DIAGNOSIS — G4733 Obstructive sleep apnea (adult) (pediatric): Secondary | ICD-10-CM

## 2015-02-27 DIAGNOSIS — Z9989 Dependence on other enabling machines and devices: Secondary | ICD-10-CM

## 2015-02-27 DIAGNOSIS — E559 Vitamin D deficiency, unspecified: Secondary | ICD-10-CM

## 2015-02-27 DIAGNOSIS — I10 Essential (primary) hypertension: Secondary | ICD-10-CM

## 2015-02-27 LAB — HEPATIC FUNCTION PANEL
ALT: 31 U/L — ABNORMAL HIGH (ref 6–29)
AST: 21 U/L (ref 10–35)
Albumin: 4.1 g/dL (ref 3.6–5.1)
Alkaline Phosphatase: 74 U/L (ref 33–130)
BILIRUBIN DIRECT: 0.1 mg/dL (ref <=0.2)
BILIRUBIN INDIRECT: 0.3 mg/dL (ref 0.2–1.2)
BILIRUBIN TOTAL: 0.4 mg/dL (ref 0.2–1.2)
Total Protein: 6.3 g/dL (ref 6.1–8.1)

## 2015-02-27 LAB — CBC WITH DIFFERENTIAL/PLATELET
BASOS PCT: 1 % (ref 0–1)
Basophils Absolute: 0.1 10*3/uL (ref 0.0–0.1)
EOS ABS: 0.3 10*3/uL (ref 0.0–0.7)
Eosinophils Relative: 4 % (ref 0–5)
HCT: 45.2 % (ref 36.0–46.0)
HEMOGLOBIN: 14.8 g/dL (ref 12.0–15.0)
Lymphocytes Relative: 28 % (ref 12–46)
Lymphs Abs: 2.1 10*3/uL (ref 0.7–4.0)
MCH: 31.2 pg (ref 26.0–34.0)
MCHC: 32.7 g/dL (ref 30.0–36.0)
MCV: 95.4 fL (ref 78.0–100.0)
MONO ABS: 0.3 10*3/uL (ref 0.1–1.0)
MPV: 10.3 fL (ref 8.6–12.4)
Monocytes Relative: 4 % (ref 3–12)
NEUTROS ABS: 4.7 10*3/uL (ref 1.7–7.7)
NEUTROS PCT: 63 % (ref 43–77)
PLATELETS: 227 10*3/uL (ref 150–400)
RBC: 4.74 MIL/uL (ref 3.87–5.11)
RDW: 13.5 % (ref 11.5–15.5)
WBC: 7.4 10*3/uL (ref 4.0–10.5)

## 2015-02-27 LAB — LIPID PANEL
Cholesterol: 139 mg/dL (ref 125–200)
HDL: 34 mg/dL — ABNORMAL LOW (ref >=46)
LDL Cholesterol: 61 mg/dL (ref <130)
Total CHOL/HDL Ratio: 4.1 Ratio (ref <=5.0)
Triglycerides: 219 mg/dL — ABNORMAL HIGH (ref <150)
VLDL: 44 mg/dL — ABNORMAL HIGH (ref <30)

## 2015-02-27 LAB — BASIC METABOLIC PANEL WITH GFR
BUN: 13 mg/dL (ref 7–25)
CALCIUM: 9.7 mg/dL (ref 8.6–10.4)
CO2: 28 mmol/L (ref 20–31)
Chloride: 104 mmol/L (ref 98–110)
Creat: 0.63 mg/dL (ref 0.50–1.05)
Glucose, Bld: 129 mg/dL — ABNORMAL HIGH (ref 65–99)
Potassium: 4.4 mmol/L (ref 3.5–5.3)
SODIUM: 142 mmol/L (ref 135–146)

## 2015-02-27 LAB — URIC ACID: Uric Acid, Serum: 6.4 mg/dL (ref 2.4–7.0)

## 2015-02-27 MED ORDER — DAPAGLIFLOZIN PRO-METFORMIN ER 5-1000 MG PO TB24: 5.0000 mg | ORAL_TABLET | Freq: Two times a day (BID) | ORAL | Status: DC

## 2015-02-27 NOTE — Patient Instructions (Signed)
Diabetes is a very complicated disease...lets simplify it.  An easy way to look at it to understand the complications is if you think of the extra sugar floating in your blood stream as glass shards floating through your blood stream.    Diabetes affects your small vessels first: 1) The glass shards (sugar) scraps down the tiny blood vessels in your eyes and lead to diabetic retinopathy, the leading cause of blindness in the [place]. Diabetes is the leading cause of newly diagnosed adult (20 to 60 years of age) blindness in the [place].  2) The glass shards scratches down the tiny vessels of your legs leading to nerve damage called neuropathy and can lead to amputations of your feet. More than 60% of all non-traumatic amputations of lower limbs occur in people with diabetes.  3) Over time the small vessels in your brain are shredded and closed off, individually this does not cause any problems but over a long period of time many of the small vessels being blocked can lead to Vascular Dementia.   4) Your kidney's are a filter system and have a "net" that keeps certain things in the body and lets bad things out. Sugar shreds this net and leads to kidney damage and eventually failure. Decreasing the sugar that is destroying the net and certain blood pressure medications can help stop or decrease progression of kidney disease. Diabetes was the primary cause of kidney failure in 44 percent of all new cases in 2011.  5) Diabetes also destroys the small vessels in your penis that lead to erectile dysfunction. Eventually the vessels are so damaged that you may not be responsive to cialis or viagra.   Diabetes and your large vessels: Your larger vessels consist of your coronary arteries in your heart and the carotid vessels to your brain. Diabetes or even increased sugars put you at 300% increased risk of heart attack and stroke and this is why.. The sugar scrapes down your large blood vessels and your body  sees this as an internal injury and tries to repair itself. Just like you get a scab on your skin, your platelets will stick to the blood vessel wall trying to heal it. This is why we have diabetics on low dose aspirin daily, this prevents the platelets from sticking and can prevent plaque formation. In addition, your body takes cholesterol and tries to shove it into the open wound. This is why we want your LDL, or bad cholesterol, below 70.   The combination of platelets and cholesterol over 5-10 years forms plaque that can break off and cause a heart attack or stroke.   PLEASE REMEMBER:  Diabetes is preventable! Up to 85 percent of complications and morbidities among individuals with type 2 diabetes can be prevented, delayed, or effectively treated and minimized with regular visits to a health professional, appropriate monitoring and medication, and a healthy diet and lifestyle.     Bad carbs also include fruit juice, alcohol, and sweet tea. These are empty calories that do not signal to your brain that you are full.   Please remember the good carbs are still carbs which convert into sugar. So please measure them out no more than 1/2-1 cup of rice, oatmeal, pasta, and beans  Veggies are however free foods! Pile them on.   Not all fruit is created equal. Please see the list below, the fruit at the bottom is higher in sugars than the fruit at the top. Please avoid all dried fruits.       We want weight loss that will last so you should lose 1-2 pounds a week.  THAT IS IT! Please pick THREE things a month to change. Once it is a habit check off the item. Then pick another three items off the list to become habits.  If you are already doing a habit on the list GREAT!  Cross that item off! o Don't drink your calories. Ie, alcohol, soda, fruit juice, and sweet tea.  o Drink more water. Drink a glass when you feel hungry or before each meal.  o Eat breakfast - Complex carb and protein (likeDannon  light and fit yogurt, oatmeal, fruit, eggs, turkey bacon). o Measure your cereal.  Eat no more than one cup a day. (ie [employer]) o Eat an apple a day. o Add a vegetable a day. o Try a new vegetable a month. o Use Pam! Stop using oil or butter to cook. o Don't finish your plate or use smaller plates. o Share your dessert. o Eat sugar free Jello for dessert or frozen grapes. o Don't eat 2-3 hours before bed. o Switch to whole wheat bread, pasta, and brown rice. o Make healthier choices when you eat out. No fries! o Pick baked chicken, NOT fried. o Don't forget to SLOW DOWN when you eat. It is not going anywhere.  o Take the stairs. o Park far away in the parking lot o Lift soup cans (or weights) for 10 minutes while watching TV. o Walk at work for 10 minutes during break. o Walk outside 1 time a week with your friend, kids, dog, or significant other. o Start a walking group at church. o Walk the mall as much as you can tolerate.  o Keep a food diary. o Weigh yourself daily. o Walk for 15 minutes 3 days per week. o Cook at home more often and eat out less.  If life happens and you go back to old habits, it is okay.  Just start over. You can do it!   If you experience chest pain, get short of breath, or tired during the exercise, please stop immediately and inform your doctor.   

## 2015-02-27 NOTE — Progress Notes (Signed)
Complete Physical  Assessment and Plan: 1. Essential hypertension - continue medications, DASH diet, exercise and monitor at home. Call if greater than 130/80.  - CBC with Differential/Platelet - BASIC METABOLIC PANEL WITH GFR - Hepatic function panel - TSH - Urinalysis, Routine w reflex microscopic (not at [facility]) - Microalbumin / creatinine urine ratio - EKG 12-Lead  2. CKD stage 2 due to type 2 diabetes mellitus ([place]) Discussed general issues about diabetes pathophysiology and management., Educational material distributed., Suggested low cholesterol diet., Encouraged aerobic exercise., Discussed foot care., Reminded to get yearly retinal exam. - BASIC METABOLIC PANEL WITH GFR - Hemoglobin A1c - Urinalysis, Routine w reflex microscopic (not at [facility]) - Microalbumin / creatinine urine ratio  3. Hyperlipidemia -continue medications, check lipids, decrease fatty foods, increase activity.  - Lipid panel  4. Obstructive sleep apnea on CPAP Sleep apnea- continue CPAP, weight loss advised.   5. Colon polyps Due next year  6. 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 Depression remission  9. Anemia, unspecified anemia type Check CBC  10. Anxiety improved  11. Medication management  12. Encounter for general adult medical examination with abnormal findings - CBC with Differential/Platelet - BASIC METABOLIC PANEL WITH GFR - Hepatic function panel - TSH - Lipid panel - Hemoglobin A1c - Urinalysis, Routine w reflex microscopic (not at [facility]) - Microalbumin / creatinine urine ratio - Uric acid - EKG 12-Lead  Discussed med's effects and SE's. Screening labs and tests as requested with regular follow-up as recommended. Over 40 minutes of exam, counseling, chart review, and complex, high level critical decision making was performed this visit.   HPI  60 y.o. female   presents for a complete physical.  Her blood pressure has been controlled at home, today their BP is BP: 124/66 mmHg She does not workout. She denies chest pain, shortness of breath, dizziness.  She is not on cholesterol medication and denies myalgias. Her cholesterol is at goal. The cholesterol last visit was:   Lab Results  Component Value Date   CHOL 139 11/15/2014   HDL 26* 11/15/2014   LDLCALC 50 11/15/2014   TRIG 316* 11/15/2014   CHOLHDL 5.3* 11/15/2014   She has been working on diet and exercise for diabetes, she is on bASA, she is on ACE/ARB, switched to xigduo, on trajenta and on glimeperide 1/2 daily and denies paresthesia of the feet, polydipsia, polyuria and visual disturbances. Last A1C in the office was:  Lab Results  Component Value Date   HGBA1C 9.2* 11/15/2014   Last GFR: Lab Results  Component Value Date   [facility] >89 11/15/2014  Patient is on Vitamin D supplement.   Lab Results  Component Value Date   VD25OH 47 11/15/2014     BMI is Body mass index is 32.95 kg/(m^2)., she is working on diet and exercise. Has not been on  Phentermine.  Wt Readings from Last 3 Encounters:  02/27/15 189 lb (85.73 kg)  12/19/14 190 lb (86.183 kg)  11/15/14 189 lb (85.73 kg)    Current Medications:  Current Outpatient Prescriptions on File Prior to Visit  Medication Sig Dispense Refill  . aspirin 81 MG tablet Take 81 mg by mouth daily.    . Calcium Carbonate-Vit D-Min (CALCIUM 1200 PO) Take by mouth daily.    . Cholecalciferol (VITAMIN D) 2000 UNITS tablet Take 2,000 Units by mouth 2 (two) times daily.    . cyclobenzaprine (FLEXERIL)  10 MG tablet 1-2 at night for headache/TMJ 60 tablet 1  . Dapagliflozin-Metformin HCl ER (XIGDUO XR) 06-998 MG TB24 Take 5 mg by mouth 2 (two) times daily. 60 tablet 5  . fluconazole (DIFLUCAN) 150 MG tablet Take 1 tablet (150 mg total) by mouth daily. 1 tablet 3  . glimepiride (AMARYL) 4 MG tablet Take 1 tablet (4 mg total) by mouth daily with  breakfast. 30 tablet 4  . linagliptin (TRADJENTA) 5 MG TABS tablet Take 1 tablet (5 mg total) by mouth daily. 90 tablet 4  . lisinopril (PRINIVIL,ZESTRIL) 10 MG tablet TAKE 1 TABLET (10 MG TOTAL) BY MOUTH DAILY. 90 tablet 1  . Magnesium 250 MG TABS Take by mouth daily.    . meclizine (ANTIVERT) 25 MG tablet 1/2-1 pill up to 3 times daily for motion sickness/dizziness 30 tablet 0  . Multiple Vitamin (MULTIVITAMIN) capsule Take 1 capsule by mouth daily.    . Omega-3 Fatty Acids (FISH OIL) 1000 MG CAPS Take by mouth daily.    . phentermine (ADIPEX-P) 37.5 MG tablet Take 1/2 to 1 tablet daily before breakfast for weight loss 30 tablet 2  . scopolamine (TRANSDERM-SCOP, 1.5 MG,) 1 MG/3DAYS Place 1 patch (1.5 mg total) onto the skin every 3 (three) days. 4 patch 0  . vitamin B-12 (CYANOCOBALAMIN) 1000 MCG tablet Take 1,000 mcg by mouth daily.     No current facility-administered medications on file prior to visit.   Health Maintenance:   Immunization History  Administered Date(s) Administered  . PPD Test 02/22/2013, 02/23/2014  . Td 02/09/2001  . Tdap 02/23/2011  . Zoster 02/09/2006    Tetanus: 2013 Pneumovax: declines Prevnar 13: declines Flu vaccine: declines Zostavax: 2008  Pap:2013 due 2018 MGM: 02/2015 normal at [facility]: 2010 normal Colonoscopy: 2012 due 2018  Last Eye Exam: Feb 2nd next appointment, Dr. Pricilla Riffle Patient Care Team: Unk Pinto, MD as PCP - General (Internal Medicine) Calvert Cantor, MD as Consulting Physician (Ophthalmology) Richmond Campbell, MD as Consulting Physician (Gastroenterology) Brien Few, MD as Consulting Physician (Obstetrics and Gynecology) Daryll Brod, MD as Consulting Physician (Orthopedic Surgery) Lavonna Monarch, MD as Consulting Physician (Dermatology)  Allergies:  Allergies  Allergen Reactions  . Clinoril [Sulindac]   . Erythromycin    Medical History:  Past Medical History  Diagnosis Date  . Hyperlipidemia   . Colon polyps    . Diabetes mellitus without complication ([place])   . Anemia   . Anxiety   . Hypertension   . Depression, controlled   . Gout   . Obstructive sleep apnea on CPAP    Surgical History:  Past Surgical History  Procedure Laterality Date  . Abdominal hysterectomy    . Breast surgery Left     asp cyst  . Ovary surgery Left 1999  . Achilles Right 2009    Bednarz- reattached achilles/ removed spur/ lengthened calf   Family History:  Family History  Problem Relation Age of Onset  . Heart disease Father   . Alcohol abuse Father   . Heart attack Father   . Diabetes Sister   . COPD Mother   . Depression Mother    Social History:  Social History  Substance Use Topics  . Smoking status: Never Smoker   . Smokeless tobacco: None  . Alcohol Use: No   Review of Systems: Review of Systems  Constitutional: Negative.   HENT: Negative.   Eyes: Negative.   Respiratory: Negative.   Cardiovascular: Negative.   Gastrointestinal: Negative.   Genitourinary:  Positive for frequency. Negative for dysuria, urgency, hematuria and flank pain.       Stress incontinence  Musculoskeletal: Negative.   Skin: Negative.   Neurological: Negative.   Endo/Heme/Allergies: Positive for polydipsia. Negative for environmental allergies. Does not bruise/bleed easily.  Psychiatric/Behavioral: Negative.     Physical Exam: Estimated body mass index is 32.95 kg/(m^2) as calculated from the following:   Height as of this encounter: 5' 3.5" (1.613 m).   Weight as of this encounter: 189 lb (85.73 kg). BP 124/66 mmHg  Pulse 94  Temp(Src) 98.1 F (36.7 C) (Temporal)  Resp 16  Ht 5' 3.5" (1.613 m)  Wt 189 lb (85.73 kg)  BMI 32.95 kg/m2  SpO2 97% General Appearance: Well nourished, in no apparent distress.  Eyes: PERRLA, EOMs, conjunctiva no swelling or erythema, normal fundi and vessels.  Sinuses: No Frontal/maxillary tenderness  ENT/Mouth: Ext aud canals clear, normal light reflex with TMs without  erythema, bulging. Good dentition. No erythema, swelling, or exudate on post pharynx. Tonsils not swollen or erythematous. Hearing normal.  Neck: Supple, thyroid normal. No bruits  Respiratory: Respiratory effort normal, BS equal bilaterally without rales, rhonchi, wheezing or stridor.  Cardio: RRR without murmurs, rubs or gallops. Brisk peripheral pulses without edema.  Chest: symmetric, with normal excursions and percussion.  Breasts: Symmetric, without lumps, nipple discharge, retractions.  Abdomen: Soft, nontender, no guarding, rebound, hernias, masses, or organomegaly.  Lymphatics: Non tender without lymphadenopathy.  Genitourinary: defer Musculoskeletal: Full ROM all peripheral extremities,5/5 strength, and normal gait.  Skin: Warm, dry without rashes, lesions, ecchymosis. Neuro: Cranial nerves intact, reflexes equal bilaterally. Normal muscle tone, no cerebellar symptoms. Sensation intact.  Psych: Awake and oriented X 3, normal affect, Insight and Judgment appropriate.   EKG: WNL no ST changes. AORTA SCAN: defer  Vicie Mutters 9:32 AM [facility]

## 2015-02-28 LAB — URINALYSIS, ROUTINE W REFLEX MICROSCOPIC
Bilirubin Urine: NEGATIVE
HGB URINE DIPSTICK: NEGATIVE
Ketones, ur: NEGATIVE
LEUKOCYTES UA: NEGATIVE
NITRITE: NEGATIVE
PROTEIN: NEGATIVE
Specific Gravity, Urine: 1.037 — ABNORMAL HIGH (ref 1.001–1.035)
pH: 5 (ref 5.0–8.0)

## 2015-02-28 LAB — URINALYSIS, MICROSCOPIC ONLY
BACTERIA UA: NONE SEEN [HPF]
CASTS: NONE SEEN [LPF]
CRYSTALS: NONE SEEN [HPF]
RBC / HPF: NONE SEEN RBC/HPF (ref <=2)
SQUAMOUS EPITHELIAL / LPF: NONE SEEN [HPF] (ref <=5)
WBC, UA: NONE SEEN WBC/HPF (ref <=5)

## 2015-02-28 LAB — MICROALBUMIN / CREATININE URINE RATIO
CREATININE, URINE: 68 mg/dL (ref 20–320)
MICROALB UR: 0.2 mg/dL
MICROALB/CREAT RATIO: 3 ug/mg{creat} (ref <30)

## 2015-02-28 LAB — HEMOGLOBIN A1C
HEMOGLOBIN A1C: 7.6 % — AB (ref <5.7)
MEAN PLASMA GLUCOSE: 171 mg/dL — AB (ref <117)

## 2015-02-28 LAB — TSH: TSH: 0.746 u[IU]/mL (ref 0.350–4.500)

## 2015-03-01 ENCOUNTER — Encounter: Payer: Self-pay | Admitting: Physician Assistant

## 2015-03-04 ENCOUNTER — Other Ambulatory Visit: Payer: Self-pay | Admitting: Physician Assistant

## 2015-03-04 MED ORDER — FLUCONAZOLE 150 MG PO TABS: 150.0000 mg | ORAL_TABLET | Freq: Once | ORAL | Status: DC

## 2015-03-05 ENCOUNTER — Other Ambulatory Visit: Payer: Self-pay

## 2015-03-05 MED ORDER — GLIMEPIRIDE 4 MG PO TABS: 4.0000 mg | ORAL_TABLET | Freq: Every day | ORAL | Status: DC

## 2015-03-17 ENCOUNTER — Other Ambulatory Visit: Payer: Self-pay | Admitting: Internal Medicine

## 2015-03-17 MED ORDER — FLUCONAZOLE 150 MG PO TABS: 150.0000 mg | ORAL_TABLET | Freq: Once | ORAL | Status: DC

## 2015-04-23 ENCOUNTER — Encounter: Payer: Self-pay | Admitting: Physician Assistant

## 2015-04-23 MED ORDER — PHENTERMINE HCL 37.5 MG PO TABS: ORAL_TABLET | ORAL | Status: DC

## 2015-04-29 ENCOUNTER — Other Ambulatory Visit: Payer: Self-pay | Admitting: Internal Medicine

## 2015-04-30 ENCOUNTER — Encounter: Payer: Self-pay | Admitting: Physician Assistant

## 2015-05-01 ENCOUNTER — Other Ambulatory Visit: Payer: Self-pay | Admitting: Physician Assistant

## 2015-05-01 MED ORDER — FLUCONAZOLE 150 MG PO TABS: 150.0000 mg | ORAL_TABLET | Freq: Once | ORAL | Status: DC

## 2015-05-04 ENCOUNTER — Encounter: Payer: Self-pay | Admitting: *Deleted

## 2015-05-14 ENCOUNTER — Encounter: Payer: Self-pay | Admitting: Physician Assistant

## 2015-05-14 ENCOUNTER — Ambulatory Visit (INDEPENDENT_AMBULATORY_CARE_PROVIDER_SITE_OTHER): Payer: 59 | Admitting: Physician Assistant

## 2015-05-14 VITALS — BP 122/74 | HR 80 | Temp 97.3°F | Resp 16 | Ht 63.5 in | Wt 186.8 lb

## 2015-05-14 DIAGNOSIS — I10 Essential (primary) hypertension: Secondary | ICD-10-CM

## 2015-05-14 DIAGNOSIS — E1122 Type 2 diabetes mellitus with diabetic chronic kidney disease: Secondary | ICD-10-CM

## 2015-05-14 DIAGNOSIS — E559 Vitamin D deficiency, unspecified: Secondary | ICD-10-CM

## 2015-05-14 DIAGNOSIS — E785 Hyperlipidemia, unspecified: Secondary | ICD-10-CM | POA: Diagnosis not present

## 2015-05-14 DIAGNOSIS — D649 Anemia, unspecified: Secondary | ICD-10-CM | POA: Diagnosis not present

## 2015-05-14 DIAGNOSIS — Z79899 Other long term (current) drug therapy: Secondary | ICD-10-CM

## 2015-05-14 DIAGNOSIS — N182 Chronic kidney disease, stage 2 (mild): Secondary | ICD-10-CM

## 2015-05-14 MED ORDER — CANAGLIFLOZIN-METFORMIN HCL ER 150-1000 MG PO TB24: 300.0000 mg | ORAL_TABLET | Freq: Every day | ORAL | Status: DC

## 2015-05-14 NOTE — Patient Instructions (Signed)
Follow up with Derm about hair loss Please check to see what medications your insurance covers.  Follow up 2-3 weeks for LABs only  Before you even begin to attack a weight-loss plan, it pays to remember this: You are not fat. You have fat. Losing weight isn't about blame or shame; it's simply another achievement to accomplish. Dieting is like any other skill-you have to buckle down and work at it. As long as you act in a smart, reasonable way, you'll ultimately get where you want to be. Here are some weight loss pearls for you.  1. It's Not a Diet. It's a Lifestyle Thinking of a diet as something you're on and suffering through only for the short term doesn't work. To shed weight and keep it off, you need to make permanent changes to the way you eat. It's OK to indulge occasionally, of course, but if you cut calories temporarily and then revert to your old way of eating, you'll gain back the weight quicker than you can say yo-yo. Use it to lose it. Research shows that one of the best predictors of long-term weight loss is how many pounds you drop in the first month. For that reason, nutritionists often suggest being stricter for the first two weeks of your new eating strategy to build momentum. Cut out added sugar and alcohol and avoid unrefined carbs. After that, figure out how you can reincorporate them in a way that's healthy and maintainable.  2. There's a Right Way to Exercise Working out burns calories and fat and boosts your metabolism by building muscle. But those trying to lose weight are notorious for overestimating the number of calories they burn and underestimating the amount they take in. Unfortunately, your system is biologically programmed to hold on to extra pounds and that means when you start exercising, your body senses the deficit and ramps up its hunger signals. If you're not diligent, you'll eat everything you burn and then some. Use it to lose it. Cardio gets all the exercise  glory, but strength and interval training are the real heroes. They help you build lean muscle, which in turn increases your metabolism and calorie-burning ability 3. Don't Overreact to Mild Hunger Some people have a hard time losing weight because of hunger anxiety. To them, being hungry is bad-something to be avoided at all costs-so they carry snacks with them and eat when they don't need to. Others eat because they're stressed out or bored. While you never want to get to the point of being ravenous (that's when bingeing is likely to happen), a hunger pang, a craving, or the fact that it's 3:00 p.m. should not send you racing for the vending machine or obsessing about the energy bar in your purse. Ideally, you should put off eating until your stomach is growling and it's difficult to concentrate.  Use it to lose it. When you feel the urge to eat, use the HALT method. Ask yourself, Am I really hungry? Or am I angry or anxious, lonely or bored, or tired? If you're still not certain, try the apple test. If you're truly hungry, an apple should seem delicious; if it doesn't, something else is going on. Or you can try drinking water and making yourself busy, if you are still hungry try a healthy snack.  4. Not All Calories Are Created Equal The mechanics of weight loss are pretty simple: Take in fewer calories than you use for energy. But the kind of food you eat makes  all the difference. Processed food that's high in saturated fat and refined starch or sugar can cause inflammation that disrupts the hormone signals that tell your brain you're full. The result: You eat a lot more.  Use it to lose it. Clean up your diet. Swap in whole, unprocessed foods, including vegetables, lean protein, and healthy fats that will fill you up and give you the biggest nutritional bang for your calorie buck. In a few weeks, as your brain starts receiving regular hunger and fullness signals once again, you'll notice that you feel  less hungry overall and naturally start cutting back on the amount you eat.  5. Protein, Produce, and Plant-Based Fats Are Your Weight-Loss Trinity Here's why eating the three Ps regularly will help you drop pounds. Protein fills you up. You need it to build lean muscle, which keeps your metabolism humming so that you can torch more fat. People in a weight-loss program who ate double the recommended daily allowance for protein (about 110 grams for a 150-pound woman) lost 70 percent of their weight from fat, while people who ate the RDA lost only about 40 percent, one study found. Produce is packed with filling fiber. "It's very difficult to consume too many calories if you're eating a lot of vegetables. Example: Three cups of broccoli is a lot of food, yet only 93 calories. (Fruit is another story. It can be easy to overeat and can contain a lot of calories from sugar, so be sure to monitor your intake.) Plant-based fats like olive oil and those in avocados and nuts are healthy and extra satiating.  Use it to lose it. Aim to incorporate each of the three Ps into every meal and snack. People who eat protein throughout the day are able to keep weight off, according to a study in the [place] of Clinical Nutrition. In addition to meat, poultry and seafood, good sources are beans, lentils, eggs, tofu, and yogurt. As for fat, keep portion sizes in check by measuring out salad dressing, oil, and nut butters (shoot for one to two tablespoons). Finally, eat veggies or a little fruit at every meal. People who did that consumed 308 fewer calories but didn't feel any hungrier than when they didn't eat more produce.  7. How You Eat Is As Important As What You Eat In order for your brain to register that you're full, you need to focus on what you're eating. Sit down whenever you eat, preferably at a table. Turn off the TV or computer, put down your phone, and look at your food. Smell it. Chew slowly, and don't  put another bite on your fork until you swallow. When women ate lunch this attentively, they consumed 30 percent less when snacking later than those who listened to an audiobook at lunchtime, according to a study in the [place] of Nutrition. 8. Weighing Yourself Really Works The scale provides the best evidence about whether your efforts are paying off. Seeing the numbers tick up or down or stagnate is motivation to keep going-or to rethink your approach. A 2015 study at [facility] found that daily weigh-ins helped people lose more weight, keep it off, and maintain that loss, even after two years. Use it to lose it. Step on the scale at the same time every day for the best results. If your weight shoots up several pounds from one weigh-in to the next, don't freak out. Eating a lot of salt the night before or having your period is  the likely culprit. The number should return to normal in a day or two. It's a steady climb that you need to do something about. 9. Too Much Stress and Too Little Sleep Are Your Enemies When you're tired and frazzled, your body cranks up the production of cortisol, the stress hormone that can cause carb cravings. Not getting enough sleep also boosts your levels of ghrelin, a hormone associated with hunger, while suppressing leptin, a hormone that signals fullness and satiety. People on a diet who slept only five and a half hours a night for two weeks lost 55 percent less fat and were hungrier than those who slept eight and a half hours, according to a study in the [place]. Use it to lose it. Prioritize sleep, aiming for seven hours or more a night, which research shows helps lower stress. And make sure you're getting quality zzz's. If a snoring spouse or a fidgety cat wakes you up frequently throughout the night, you may end up getting the equivalent of just four hours of sleep, according to a study from [facility]. Keep pets out  of the bedroom, and use a white-noise app to drown out snoring. 10. You Will Hit a plateau-And You Can Bust Through It As you slim down, your body releases much less leptin, the fullness hormone.  If you're not strength training, start right now. Building muscle can raise your metabolism to help you overcome a plateau. To keep your body challenged and burning calories, incorporate new moves and more intense intervals into your workouts or add another sweat session to your weekly routine. Alternatively, cut an extra 100 calories or so a day from your diet. Now that you've lost weight, your body simply doesn't need as much fuel.

## 2015-05-14 NOTE — Progress Notes (Signed)
Assessment and Plan:  Hypertension: Continue medication, monitor blood pressure at home. Continue DASH diet.  Reminder to go to the ER if any CP, SOB, nausea, dizziness, severe HA, changes vision/speech, left arm numbness and tingling and jaw pain. Cholesterol: Continue diet and exercise. Check cholesterol.  Diabetes-Continue diet and exercise. Check A1C, invokamet samples and will check with once a week injectable.  Vitamin D Def- check level and continue medications.  Obesity with co morbidities- long discussion about weight loss, diet, and exercise Hair loss- on crown of head/vertex however some mild erythema of hair follicles and easy hair pull test, suggest follow up with Derm, declines lab testing here due to insurance  Continue diet and meds as discussed. Further disposition pending results of labs. Discussed med's effects and SE's.   HPI 60 y.o. female  presents for 3 month follow up with hypertension, hyperlipidemia, diabetes and vitamin D. Her blood pressure has been controlled at home, today their BP is BP: 122/74 mmHg She does not workout. She denies chest pain, shortness of breath, dizziness.  She is not on cholesterol medication and denies myalgias. Her cholesterol is at goal. The cholesterol was:  02/27/2015: Cholesterol 139; HDL 34*; LDL Cholesterol 61; Triglycerides 219* She has been working on diet and exercise for Diabetes, she did have steroid shot on the right this past Friday, she has been checking her sugars at home running 200;s fasting, she , and she is off glimeperide, she has had yeast infections with the xigduo and not with the invokamet, would like to go back. she is on ASA and she is on ACE/ARB, and having polydipsia, polyuria and denies paresthesia of the feet and visual disturbances. Last A1C  was: 02/27/2015: Hgb A1c MFr Bld 7.6*  Lab Results  Component Value Date   GFRNONAA >89 02/27/2015   Has had her hair falling out since jan/Feb, currently in a wig, states that  her mother had her hair thinning as well.  Patient is on Vitamin D supplement. She was on allopurinol for gout but last time she was not on it and last uric acid was   Lab Results  Component Value Date   LABURIC 6.4 02/27/2015   11/15/2014: Vit D, 25-Hydroxy 47  BMI is Body mass index is 32.57 kg/(m^2)., she is working on diet and exercise and has done well. She is on phentermine.  Wt Readings from Last 3 Encounters:  05/14/15 186 lb 12.8 oz (84.732 kg)  02/27/15 189 lb (85.73 kg)  12/19/14 190 lb (86.183 kg)    Current Medications:  Current Outpatient Prescriptions on File Prior to Visit  Medication Sig Dispense Refill  . aspirin 81 MG tablet Take 81 mg by mouth daily.    . Calcium Carbonate-Vit D-Min (CALCIUM 1200 PO) Take by mouth daily.    . Cholecalciferol (VITAMIN D) 2000 UNITS tablet Take 2,000 Units by mouth 2 (two) times daily.    . cyclobenzaprine (FLEXERIL) 10 MG tablet 1-2 at night for headache/TMJ 60 tablet 1  . Dapagliflozin-Metformin HCl ER (XIGDUO XR) 06-998 MG TB24 Take 5 mg by mouth 2 (two) times daily. 60 tablet 5  . fluconazole (DIFLUCAN) 150 MG tablet Take 1 tablet (150 mg total) by mouth once. 7 tablet 0  . fluconazole (DIFLUCAN) 150 MG tablet Take 1 tablet (150 mg total) by mouth once. 1 tablet 3  . glimepiride (AMARYL) 4 MG tablet Take 1 tablet (4 mg total) by mouth daily with breakfast. 90 tablet 0  . linagliptin (TRADJENTA) 5  MG TABS tablet Take 1 tablet (5 mg total) by mouth daily. 90 tablet 4  . lisinopril (PRINIVIL,ZESTRIL) 10 MG tablet TAKE 1 TABLET (10 MG TOTAL) BY MOUTH DAILY. 90 tablet 1  . Magnesium 250 MG TABS Take by mouth daily.    . Multiple Vitamin (MULTIVITAMIN) capsule Take 1 capsule by mouth daily.    . Omega-3 Fatty Acids (FISH OIL) 1000 MG CAPS Take by mouth daily.    . phentermine (ADIPEX-P) 37.5 MG tablet Take 1/2 to 1 tablet daily before breakfast for weight loss 30 tablet 2  . vitamin B-12 (CYANOCOBALAMIN) 1000 MCG tablet Take 1,000 mcg  by mouth daily.     No current facility-administered medications on file prior to visit.   Medical History:  Past Medical History  Diagnosis Date  . Hyperlipidemia   . Colon polyps   . Diabetes mellitus without complication ([place])   . Anemia   . Anxiety   . Hypertension   . Depression, controlled   . Gout   . Obstructive sleep apnea on CPAP    Allergies:  Allergies  Allergen Reactions  . Clinoril [Sulindac]   . Erythromycin     Review of Systems  Constitutional: Negative.   HENT: Negative.   Eyes: Negative.   Respiratory: Negative.   Cardiovascular: Negative.   Gastrointestinal: Negative.   Genitourinary: Positive for frequency. Negative for dysuria, urgency, hematuria and flank pain.       Stress incontinence  Musculoskeletal: Negative.   Skin: Negative.  Negative for itching.       Hair loss  Neurological: Negative.   Endo/Heme/Allergies: Positive for polydipsia. Negative for environmental allergies. Does not bruise/bleed easily.  Psychiatric/Behavioral: Negative.      Family history- Review and unchanged Social history- Review and unchanged Physical Exam: BP 122/74 mmHg  Pulse 80  Temp(Src) 97.3 F (36.3 C)  Resp 16  Ht 5' 3.5" (1.613 m)  Wt 186 lb 12.8 oz (84.732 kg)  BMI 32.57 kg/m2 Wt Readings from Last 3 Encounters:  05/14/15 186 lb 12.8 oz (84.732 kg)  02/27/15 189 lb (85.73 kg)  12/19/14 190 lb (86.183 kg)   General Appearance: Well nourished, in no apparent distress. Eyes: PERRLA, EOMs, conjunctiva no swelling or erythema Sinuses: No Frontal/maxillary tenderness ENT/Mouth: Ext aud canals clear, TMs without erythema, bulging. No erythema, swelling, or exudate on post pharynx.  Tonsils not swollen or erythematous. Hearing normal.  Neck: Supple, thyroid normal.  Respiratory: Respiratory effort normal, BS equal bilaterally without rales, rhonchi, wheezing or stridor.  Cardio: RRR with no MRGs. Brisk peripheral pulses without edema.  Abdomen: Soft,  + BS.  Non tender, no guarding, rebound, hernias, masses. Lymphatics: Non tender without lymphadenopathy.  Musculoskeletal: Full ROM, 5/5 strength, normal gait.  Skin: Crown hair thinning with mild follicular erythema. Warm, dry without rashes, lesions, ecchymosis.  Neuro: Cranial nerves intact. No cerebellar symptoms. Sensation intact.  Psych: Awake and oriented X 3, normal affect, Insight and Judgment appropriate.    Vicie Mutters, PA-C 9:22 AM [facility]

## 2015-05-27 ENCOUNTER — Encounter: Payer: Self-pay | Admitting: Physician Assistant

## 2015-05-28 ENCOUNTER — Other Ambulatory Visit: Payer: 59

## 2015-05-28 DIAGNOSIS — I10 Essential (primary) hypertension: Secondary | ICD-10-CM

## 2015-05-28 DIAGNOSIS — E1122 Type 2 diabetes mellitus with diabetic chronic kidney disease: Secondary | ICD-10-CM

## 2015-05-28 DIAGNOSIS — E785 Hyperlipidemia, unspecified: Secondary | ICD-10-CM

## 2015-05-28 DIAGNOSIS — N182 Chronic kidney disease, stage 2 (mild): Secondary | ICD-10-CM

## 2015-05-28 LAB — BASIC METABOLIC PANEL WITH GFR
BUN: 14 mg/dL (ref 7–25)
CALCIUM: 9.5 mg/dL (ref 8.6–10.4)
CO2: 24 mmol/L (ref 20–31)
Chloride: 105 mmol/L (ref 98–110)
Creat: 0.66 mg/dL (ref 0.50–1.05)
Glucose, Bld: 202 mg/dL — ABNORMAL HIGH (ref 65–99)
POTASSIUM: 4.3 mmol/L (ref 3.5–5.3)
Sodium: 141 mmol/L (ref 135–146)

## 2015-05-28 LAB — CBC WITH DIFFERENTIAL/PLATELET
BASOS ABS: 63 {cells}/uL (ref 0–200)
Basophils Relative: 1 %
EOS ABS: 378 {cells}/uL (ref 15–500)
Eosinophils Relative: 6 %
HCT: 43.6 % (ref 35.0–45.0)
HEMOGLOBIN: 14.4 g/dL (ref 11.7–15.5)
LYMPHS ABS: 2205 {cells}/uL (ref 850–3900)
Lymphocytes Relative: 35 %
MCH: 31 pg (ref 27.0–33.0)
MCHC: 33 g/dL (ref 32.0–36.0)
MCV: 94 fL (ref 80.0–100.0)
MPV: 9.9 fL (ref 7.5–12.5)
Monocytes Absolute: 441 cells/uL (ref 200–950)
Monocytes Relative: 7 %
NEUTROS ABS: 3213 {cells}/uL (ref 1500–7800)
Neutrophils Relative %: 51 %
Platelets: 185 10*3/uL (ref 140–400)
RBC: 4.64 MIL/uL (ref 3.80–5.10)
RDW: 13.9 % (ref 11.0–15.0)
WBC: 6.3 10*3/uL (ref 3.8–10.8)

## 2015-05-28 LAB — LIPID PANEL
CHOLESTEROL: 149 mg/dL (ref 125–200)
HDL: 36 mg/dL — ABNORMAL LOW (ref >=46)
LDL Cholesterol: 69 mg/dL (ref <130)
TRIGLYCERIDES: 220 mg/dL — AB (ref <150)
Total CHOL/HDL Ratio: 4.1 Ratio (ref <=5.0)
VLDL: 44 mg/dL — AB (ref <30)

## 2015-05-28 LAB — HEPATIC FUNCTION PANEL
ALBUMIN: 4.1 g/dL (ref 3.6–5.1)
ALK PHOS: 94 U/L (ref 33–130)
ALT: 27 U/L (ref 6–29)
AST: 17 U/L (ref 10–35)
BILIRUBIN TOTAL: 0.3 mg/dL (ref 0.2–1.2)
Bilirubin, Direct: 0.1 mg/dL (ref <=0.2)
Indirect Bilirubin: 0.2 mg/dL (ref 0.2–1.2)
Total Protein: 6.2 g/dL (ref 6.1–8.1)

## 2015-05-28 LAB — HEMOGLOBIN A1C
HEMOGLOBIN A1C: 7.5 % — AB (ref <5.7)
MEAN PLASMA GLUCOSE: 169 mg/dL

## 2015-05-30 ENCOUNTER — Encounter: Payer: Self-pay | Admitting: Physician Assistant

## 2015-06-04 ENCOUNTER — Ambulatory Visit: Payer: Self-pay | Admitting: Physician Assistant

## 2015-06-06 ENCOUNTER — Other Ambulatory Visit: Payer: Self-pay

## 2015-06-06 MED ORDER — CANAGLIFLOZIN-METFORMIN HCL ER 150-1000 MG PO TB24: 300.0000 mg | ORAL_TABLET | Freq: Every day | ORAL | Status: DC

## 2015-06-13 ENCOUNTER — Encounter: Payer: Self-pay | Admitting: Physician Assistant

## 2015-06-13 MED ORDER — CANAGLIFLOZIN-METFORMIN HCL ER 150-1000 MG PO TB24: 300.0000 mg | ORAL_TABLET | Freq: Every day | ORAL | Status: DC

## 2015-06-24 ENCOUNTER — Other Ambulatory Visit: Payer: Self-pay | Admitting: Internal Medicine

## 2015-06-24 MED ORDER — PREDNISONE 20 MG PO TABS: ORAL_TABLET | ORAL | Status: DC

## 2015-06-25 ENCOUNTER — Encounter: Payer: Self-pay | Admitting: Physician Assistant

## 2015-06-26 ENCOUNTER — Encounter: Payer: Self-pay | Admitting: Physician Assistant

## 2015-07-16 ENCOUNTER — Other Ambulatory Visit: Payer: Self-pay | Admitting: Physician Assistant

## 2015-07-16 DIAGNOSIS — E119 Type 2 diabetes mellitus without complications: Secondary | ICD-10-CM

## 2015-07-16 MED ORDER — GLIMEPIRIDE 4 MG PO TABS: 4.0000 mg | ORAL_TABLET | Freq: Every day | ORAL | Status: DC

## 2015-07-29 ENCOUNTER — Other Ambulatory Visit: Payer: Self-pay | Admitting: Internal Medicine

## 2015-09-12 ENCOUNTER — Encounter: Payer: Self-pay | Admitting: Physician Assistant

## 2015-09-12 ENCOUNTER — Ambulatory Visit (INDEPENDENT_AMBULATORY_CARE_PROVIDER_SITE_OTHER): Payer: 59 | Admitting: Physician Assistant

## 2015-09-12 VITALS — BP 120/68 | HR 85 | Temp 98.2°F | Ht 63.0 in | Wt 189.0 lb

## 2015-09-12 DIAGNOSIS — E559 Vitamin D deficiency, unspecified: Secondary | ICD-10-CM | POA: Diagnosis not present

## 2015-09-12 DIAGNOSIS — I10 Essential (primary) hypertension: Secondary | ICD-10-CM

## 2015-09-12 DIAGNOSIS — N182 Chronic kidney disease, stage 2 (mild): Secondary | ICD-10-CM

## 2015-09-12 DIAGNOSIS — M25511 Pain in right shoulder: Secondary | ICD-10-CM

## 2015-09-12 DIAGNOSIS — E669 Obesity, unspecified: Secondary | ICD-10-CM

## 2015-09-12 DIAGNOSIS — E1122 Type 2 diabetes mellitus with diabetic chronic kidney disease: Secondary | ICD-10-CM

## 2015-09-12 DIAGNOSIS — E785 Hyperlipidemia, unspecified: Secondary | ICD-10-CM

## 2015-09-12 DIAGNOSIS — Z79899 Other long term (current) drug therapy: Secondary | ICD-10-CM

## 2015-09-12 DIAGNOSIS — D649 Anemia, unspecified: Secondary | ICD-10-CM | POA: Diagnosis not present

## 2015-09-12 LAB — BASIC METABOLIC PANEL WITH GFR
BUN: 9 mg/dL (ref 7–25)
CHLORIDE: 103 mmol/L (ref 98–110)
CO2: 25 mmol/L (ref 20–31)
Calcium: 9.8 mg/dL (ref 8.6–10.4)
Creat: 0.82 mg/dL (ref 0.50–0.99)
GFR, EST NON AFRICAN AMERICAN: 78 mL/min (ref >=60)
GFR, Est African American: >89 mL/min (ref >=60)
Glucose, Bld: 247 mg/dL — ABNORMAL HIGH (ref 65–99)
POTASSIUM: 4.3 mmol/L (ref 3.5–5.3)
SODIUM: 143 mmol/L (ref 135–146)

## 2015-09-12 LAB — HEPATIC FUNCTION PANEL
ALK PHOS: 84 U/L (ref 33–130)
ALT: 26 U/L (ref 6–29)
AST: 22 U/L (ref 10–35)
Albumin: 4.3 g/dL (ref 3.6–5.1)
BILIRUBIN DIRECT: 0.1 mg/dL (ref <=0.2)
BILIRUBIN INDIRECT: 0.3 mg/dL (ref 0.2–1.2)
BILIRUBIN TOTAL: 0.4 mg/dL (ref 0.2–1.2)
Total Protein: 6.4 g/dL (ref 6.1–8.1)

## 2015-09-12 LAB — CBC WITH DIFFERENTIAL/PLATELET
Basophils Absolute: 63 cells/uL (ref 0–200)
Basophils Relative: 1 %
EOS ABS: 567 {cells}/uL — AB (ref 15–500)
Eosinophils Relative: 9 %
HEMATOCRIT: 43 % (ref 35.0–45.0)
Hemoglobin: 14.1 g/dL (ref 11.7–15.5)
LYMPHS PCT: 25 %
Lymphs Abs: 1575 cells/uL (ref 850–3900)
MCH: 31.8 pg (ref 27.0–33.0)
MCHC: 32.8 g/dL (ref 32.0–36.0)
MCV: 96.8 fL (ref 80.0–100.0)
MONO ABS: 315 {cells}/uL (ref 200–950)
MONOS PCT: 5 %
MPV: 10.4 fL (ref 7.5–12.5)
NEUTROS PCT: 60 %
Neutro Abs: 3780 cells/uL (ref 1500–7800)
PLATELETS: 200 10*3/uL (ref 140–400)
RBC: 4.44 MIL/uL (ref 3.80–5.10)
RDW: 13.7 % (ref 11.0–15.0)
WBC: 6.3 10*3/uL (ref 3.8–10.8)

## 2015-09-12 LAB — LIPID PANEL
Cholesterol: 149 mg/dL (ref 125–200)
HDL: 37 mg/dL — AB (ref >=46)
LDL Cholesterol: 59 mg/dL (ref <130)
TRIGLYCERIDES: 266 mg/dL — AB (ref <150)
Total CHOL/HDL Ratio: 4 Ratio (ref <=5.0)
VLDL: 53 mg/dL — AB (ref <30)

## 2015-09-12 LAB — TSH: TSH: 0.8 mIU/L

## 2015-09-12 MED ORDER — PHENTERMINE HCL 37.5 MG PO TABS: ORAL_TABLET | ORAL | 2 refills | Status: DC

## 2015-09-12 MED ORDER — CANAGLIFLOZIN-METFORMIN HCL ER 150-1000 MG PO TB24: 300.0000 mg | ORAL_TABLET | Freq: Every day | ORAL | 3 refills | Status: DC

## 2015-09-12 NOTE — Progress Notes (Signed)
Assessment and Plan:  Hypertension: Continue medication, monitor blood pressure at home. Continue DASH diet.  Reminder to go to the ER if any CP, SOB, nausea, dizziness, severe HA, changes vision/speech, left arm numbness and tingling and jaw pain. Cholesterol: Continue diet and exercise. Check cholesterol.  Diabetes-Continue diet and exercise. Check A1C, try to get good control prior to surgery. Vitamin D Def- check level and continue medications.  Obesity with co morbidities- long discussion about weight loss, diet, and exercise  Surgical clearance- Surgical Clearance: We will send letter to the surgeon for surgical clearance.  No CP, SOB, normal EKG 02/2015. Recommendations:  DVT prophylaxis, monitoring of blood sugars and blood pressure post operatively. A cardiac clearance is not recommended. Patient will schedule an appointment in the office post operatively for follow up.   Continue diet and meds as discussed. Further disposition pending results of labs. Discussed med's effects and SE's.   HPI 60 y.o. female  presents for 3 month follow up with hypertension, hyperlipidemia, diabetes and vitamin D and surgical clearance.   Right handed female with right shoulder pain progressively worse x 6 months, has failed conservative treatment, limited abduction and internal/external rotation. Planning on having surgery with Dr. Noemi Chapel, unknown date, for arthroscopy. Last EKG was 02/2015, IRBBB no ST changes. Is able to walk/take stairs without SOB, CP.   Her blood pressure has been controlled at home, today their BP is BP: 120/68 She does not workout. She denies chest pain, shortness of breath, dizziness.  She is not on cholesterol medication and denies myalgias. Her cholesterol is at goal. The cholesterol was:  05/28/2015: Cholesterol 149; HDL 36; LDL Cholesterol 69; Triglycerides 220 She has been working on diet and exercise for Diabetes,  she has been checking her sugars at home running 160-180's  fasting, and she is on glimeperide 1/2 if under 150, a whole if above 150, she has had yeast infections with the xigduo and not with the invokamet XR 150/1000 BID, and on trajenta she is on ASA and she is on ACE/ARB, and having polydipsia, polyuria and denies paresthesia of the feet and visual disturbances. Last A1C  was: 05/28/2015: Hgb A1c MFr Bld 7.5  Lab Results  Component Value Date   GFRNONAA >89 05/28/2015   Patient is on Vitamin D supplement. She was on allopurinol for gout but last time she was not on it and last uric acid was   Lab Results  Component Value Date   LABURIC 6.4 02/27/2015   11/15/2014: Vit D, 25-Hydroxy 47  BMI is Body mass index is 33.48 kg/m., she is working on diet and exercise and has done well.  Wt Readings from Last 3 Encounters:  09/12/15 189 lb (85.7 kg)  05/14/15 186 lb 12.8 oz (84.7 kg)  02/27/15 189 lb (85.7 kg)    Current Medications:  Current Outpatient Prescriptions on File Prior to Visit  Medication Sig Dispense Refill  . aspirin 81 MG tablet Take 81 mg by mouth daily.    . Calcium Carbonate-Vit D-Min (CALCIUM 1200 PO) Take by mouth daily.    . Canagliflozin-Metformin HCl ER (INVOKAMET XR) [phone] MG TB24 Take 300 mg by mouth daily. 2 pills a day 60 tablet 6  . Cholecalciferol (VITAMIN D) 2000 UNITS tablet Take 2,000 Units by mouth 2 (two) times daily.    . cyclobenzaprine (FLEXERIL) 10 MG tablet 1-2 at night for headache/TMJ 60 tablet 1  . fluconazole (DIFLUCAN) 150 MG tablet Take 1 tablet (150 mg total) by mouth once.  1 tablet 3  . glimepiride (AMARYL) 4 MG tablet Take 1 tablet (4 mg total) by mouth daily with breakfast. 90 tablet 0  . linagliptin (TRADJENTA) 5 MG TABS tablet Take 1 tablet (5 mg total) by mouth daily. 90 tablet 4  . lisinopril (PRINIVIL,ZESTRIL) 10 MG tablet TAKE 1 TABLET (10 MG TOTAL) BY MOUTH DAILY. 90 tablet 1  . Magnesium 250 MG TABS Take by mouth daily.    . Multiple Vitamin (MULTIVITAMIN) capsule Take 1 capsule by mouth  daily.    . Omega-3 Fatty Acids (FISH OIL) 1000 MG CAPS Take by mouth daily.    . phentermine (ADIPEX-P) 37.5 MG tablet Take 1/2 to 1 tablet daily before breakfast for weight loss 30 tablet 2  . vitamin B-12 (CYANOCOBALAMIN) 1000 MCG tablet Take 1,000 mcg by mouth daily.    . predniSONE (DELTASONE) 20 MG tablet 3 tabs po daily x 3 days, then 2 tabs x 3 days, then 1.5 tabs x 3 days, then 1 tab x 3 days, then 0.5 tabs x 3 days (Patient not taking: Reported on 09/12/2015) 27 tablet 0   No current facility-administered medications on file prior to visit.    Medical History:  Past Medical History:  Diagnosis Date  . Anemia   . Anxiety   . Colon polyps   . Depression, controlled   . Diabetes mellitus without complication ([place])   . Gout   . Hyperlipidemia   . Hypertension   . Obstructive sleep apnea on CPAP    Allergies:  Allergies  Allergen Reactions  . Clinoril [Sulindac]   . Erythromycin     Review of Systems  Constitutional: Negative.   HENT: Negative.   Eyes: Negative.   Respiratory: Negative.   Cardiovascular: Negative.   Gastrointestinal: Negative.   Genitourinary: Positive for frequency. Negative for dysuria, flank pain, hematuria and urgency.       Stress incontinence  Musculoskeletal: Positive for joint pain (right shoulder).  Skin: Negative.  Negative for itching.       Hair loss  Neurological: Negative.   Endo/Heme/Allergies: Positive for polydipsia. Negative for environmental allergies. Does not bruise/bleed easily.  Psychiatric/Behavioral: Negative.     Family history- Review and unchanged Social history- Review and unchanged Physical Exam: BP 120/68   Pulse 85   Temp 98.2 F (36.8 C) (Oral)   Ht 5\' 3"  (1.6 m)   Wt 189 lb (85.7 kg)   BMI 33.48 kg/m  Wt Readings from Last 3 Encounters:  09/12/15 189 lb (85.7 kg)  05/14/15 186 lb 12.8 oz (84.7 kg)  02/27/15 189 lb (85.7 kg)   General Appearance: Well nourished, in no apparent distress. Eyes: PERRLA,  EOMs, conjunctiva no swelling or erythema Sinuses: No Frontal/maxillary tenderness ENT/Mouth: Ext aud canals clear, TMs without erythema, bulging. No erythema, swelling, or exudate on post pharynx.  Tonsils not swollen or erythematous. Hearing normal.  Neck: Supple, thyroid normal.  Respiratory: Respiratory effort normal, BS equal bilaterally without rales, rhonchi, wheezing or stridor.  Cardio: RRR with no MRGs. Brisk peripheral pulses without edema.  Abdomen: Soft, + BS.  Non tender, no guarding, rebound, hernias, masses. Lymphatics: Non tender without lymphadenopathy.  Musculoskeletal: Full ROM, 5/5 strength, normal gait, Right shoulder with limited ROM due to pain, normal distal neurovascular exam.  Skin: Crown hair thinning with mild follicular erythema. Warm, dry without rashes, lesions, ecchymosis.  Neuro: Cranial nerves intact. No cerebellar symptoms. Sensation intact.  Psych: Awake and oriented X 3, normal affect, Insight and Judgment appropriate.  Vicie Mutters, PA-C 9:34 AM [facility]

## 2015-09-12 NOTE — Patient Instructions (Signed)
Recommendations For Diabetic/Prediabetic Patients:   -  Take medications as prescribed  -  Recommend Dr Joel Fuhrman's book "The End of Diabetes "  And "The End of Dieting"- Can get at  www.Amazon.com and encourage also get the Audio CD book  - AVOID Animal products, ie. Meat - red/white, Poultry and Dairy/especially cheese - Exercise at least 5 times a week for 30 minutes or preferably daily.  - No Smoking - Drink less than 2 drinks a day.  - Monitor your feet for sores - Have yearly Eye Exams - Recommend annual Flu vaccine  - Recommend Pneumovax and Prevnar vaccines - Shingles Vaccine (Zostavax) if over 60 y.o.  Goals:   - BMI less than 24 - Fasting sugar less than 130 or less than 150 if tapering medicines to lose weight  - Systolic BP less than 130  - Diastolic BP less than 80 - Bad LDL Cholesterol less than 70 - Triglycerides less than 150     Bad carbs also include fruit juice, alcohol, and sweet tea. These are empty calories that do not signal to your brain that you are full.   Please remember the good carbs are still carbs which convert into sugar. So please measure them out no more than 1/2-1 cup of rice, oatmeal, pasta, and beans  Veggies are however free foods! Pile them on.   Not all fruit is created equal. Please see the list below, the fruit at the bottom is higher in sugars than the fruit at the top. Please avoid all dried fruits.     

## 2015-09-13 LAB — HEMOGLOBIN A1C
Hgb A1c MFr Bld: 7.6 % — ABNORMAL HIGH (ref <5.7)
Mean Plasma Glucose: 171 mg/dL

## 2015-09-17 ENCOUNTER — Ambulatory Visit: Payer: Self-pay | Admitting: Physician Assistant

## 2015-10-09 ENCOUNTER — Encounter (HOSPITAL_BASED_OUTPATIENT_CLINIC_OR_DEPARTMENT_OTHER): Payer: Self-pay | Admitting: *Deleted

## 2015-10-10 ENCOUNTER — Encounter (HOSPITAL_BASED_OUTPATIENT_CLINIC_OR_DEPARTMENT_OTHER): Payer: Self-pay | Admitting: Physician Assistant

## 2015-10-10 DIAGNOSIS — M7501 Adhesive capsulitis of right shoulder: Secondary | ICD-10-CM

## 2015-10-10 HISTORY — DX: Adhesive capsulitis of right shoulder: M75.01

## 2015-10-10 NOTE — H&P (Signed)
Amanda Cooper is an 60 y.o. female.   Chief Complaint: right shoulder pain HPI: Amanda Cooper is a 60 year-old seen for evaluation with Dr. Alfonso Ramus for significant right shoulder pain and decreased range of motion for one year.  No specific injury.  Pain getting progressively worse.  He has injected her shoulder with temporary relief.  She is a diabetic and has sleep apnea.  Under the care of Dr. Melford Aase.  MRI done on Jun 17, 2015 revealed rotator cuff tendonitis with impingement with evidence of adhesive capsulitis.   Current medications: INVOKANA, Glimepiride and Lisinopril.  Past Medical History:  Diagnosis Date  . Anemia   . Anxiety   . Colon polyps   . Depression, controlled   . Diabetes mellitus without complication ([place])   . Gout   . Hyperlipidemia   . Hypertension   . Obstructive sleep apnea on CPAP    uses CPAP nightly  . Secondary adhesive capsulitis of right shoulder 10/10/2015    Past Surgical History:  Procedure Laterality Date  . ABDOMINAL HYSTERECTOMY    . achilles Right 2009   Bednarz- reattached achilles/ removed spur/ lengthened calf  . BREAST SURGERY Left    asp cyst  . EYE SURGERY    . OVARY SURGERY Left 1999    Family History  Problem Relation Age of Onset  . Heart disease Father   . Alcohol abuse Father   . Heart attack Father   . Diabetes Sister   . COPD Mother   . Depression Mother    Social History:  reports that she has never smoked. She has never used smokeless tobacco. She reports that she does not drink alcohol or use drugs.  Allergies:  Allergies  Allergen Reactions  . Clinoril [Sulindac]   . Erythromycin    No current facility-administered medications for this encounter.   Current Outpatient Prescriptions:  .  aspirin 81 MG tablet, Take 81 mg by mouth daily., Disp: , Rfl:  .  Calcium Carbonate-Vit D-Min (CALCIUM 1200 PO), Take by mouth daily., Disp: , Rfl:  .  Canagliflozin-Metformin HCl ER (INVOKAMET XR) [phone] MG TB24, Take 300  mg by mouth daily. 2 pills a day, Disp: 180 tablet, Rfl: 3 .  Cholecalciferol (VITAMIN D) 2000 UNITS tablet, Take 2,000 Units by mouth 2 (two) times daily., Disp: , Rfl:  .  cyclobenzaprine (FLEXERIL) 10 MG tablet, 1-2 at night for headache/TMJ, Disp: 60 tablet, Rfl: 1 .  glimepiride (AMARYL) 4 MG tablet, Take 1 tablet (4 mg total) by mouth daily with breakfast., Disp: 90 tablet, Rfl: 0 .  linagliptin (TRADJENTA) 5 MG TABS tablet, Take 1 tablet (5 mg total) by mouth daily., Disp: 90 tablet, Rfl: 4 .  lisinopril (PRINIVIL,ZESTRIL) 10 MG tablet, TAKE 1 TABLET (10 MG TOTAL) BY MOUTH DAILY., Disp: 90 tablet, Rfl: 1 .  Multiple Vitamin (MULTIVITAMIN) capsule, Take 1 capsule by mouth daily., Disp: , Rfl:  .  Omega-3 Fatty Acids (FISH OIL) 1000 MG CAPS, Take by mouth daily., Disp: , Rfl:   No results found for this or any previous visit (from the past 48 hour(s)). No results found.  Review of Systems  Constitutional: Negative.   HENT: Negative.   Eyes: Negative.   Respiratory: Negative.   Cardiovascular: Negative.   Gastrointestinal: Negative.   Genitourinary: Negative.   Musculoskeletal: Positive for joint pain.  Skin: Negative.   Neurological: Negative.   Endo/Heme/Allergies: Negative.   Psychiatric/Behavioral: Negative.     Height 5\' 3"  (1.6 m), weight  86.2 kg (190 lb). Physical Exam  Constitutional: She appears well-developed and well-nourished.  HENT:  Head: Normocephalic and atraumatic.  Mouth/Throat: Oropharynx is clear and moist.  Eyes: Conjunctivae are normal. Pupils are equal, round, and reactive to light.  Neck: Neck supple.  Cardiovascular: Normal rate.   Respiratory: Effort normal.  GI: Soft.  Genitourinary:  Genitourinary Comments: Not pertinent to current symptomatology therefore not examined.  Neurological:  Examination of her right shoulder reveals decreased range of motion by 25%, especially internal and external rotation.  Strength 4+/5.  No instability.   Examination of her left shoulder reveals full range of motion without pain, swelling, weakness or instability.  Vascular exam: Pulses are 2+ and symmetric     Assessment Active Problems:   Hyperlipidemia   CKD stage 2 due to type 2 diabetes mellitus (HCC)   Anemia   Anxiety   Depression   Essential hypertension   Gout   Obstructive sleep apnea on CPAP   Secondary adhesive capsulitis of right shoulder   Plan I talked to her about this in detail.  I would recommend with these findings, her significant pain, loss of motion and lack of response to conservative care that we proceed with right shoulder exam under anesthesia, manipulation and arthroscopic lysis of adhesions with attention to any rotator cuff and labral pathology with subacromial decompression.  Intensive physical therapy to follow.  Risks, complications and benefits of the surgery have been described to her in detail and she understands this completely.    Linda Hedges, PA-C 10/10/2015, 3:13 PM

## 2015-10-15 ENCOUNTER — Encounter (HOSPITAL_BASED_OUTPATIENT_CLINIC_OR_DEPARTMENT_OTHER)
Admission: RE | Disposition: A | Discharge status: Home / Self Care | Payer: Self-pay | Source: Ambulatory Visit | Attending: Orthopedic Surgery

## 2015-10-15 ENCOUNTER — Ambulatory Visit (HOSPITAL_BASED_OUTPATIENT_CLINIC_OR_DEPARTMENT_OTHER): Payer: 59 | Admitting: Anesthesiology

## 2015-10-15 ENCOUNTER — Ambulatory Visit (HOSPITAL_BASED_OUTPATIENT_CLINIC_OR_DEPARTMENT_OTHER)
Admission: RE | Admit: 2015-10-15 | Discharge: 2015-10-15 | Disposition: A | Discharge status: Home / Self Care | Payer: 59 | Source: Ambulatory Visit | Attending: Orthopedic Surgery | Admitting: Orthopedic Surgery

## 2015-10-15 ENCOUNTER — Encounter (HOSPITAL_BASED_OUTPATIENT_CLINIC_OR_DEPARTMENT_OTHER): Payer: Self-pay | Admitting: Anesthesiology

## 2015-10-15 DIAGNOSIS — M109 Gout, unspecified: Secondary | ICD-10-CM | POA: Diagnosis present

## 2015-10-15 DIAGNOSIS — Z7982 Long term (current) use of aspirin: Secondary | ICD-10-CM | POA: Diagnosis not present

## 2015-10-15 DIAGNOSIS — M75111 Incomplete rotator cuff tear or rupture of right shoulder, not specified as traumatic: Secondary | ICD-10-CM | POA: Insufficient documentation

## 2015-10-15 DIAGNOSIS — E1122 Type 2 diabetes mellitus with diabetic chronic kidney disease: Secondary | ICD-10-CM | POA: Diagnosis not present

## 2015-10-15 DIAGNOSIS — F329 Major depressive disorder, single episode, unspecified: Secondary | ICD-10-CM | POA: Insufficient documentation

## 2015-10-15 DIAGNOSIS — N182 Chronic kidney disease, stage 2 (mild): Secondary | ICD-10-CM | POA: Diagnosis present

## 2015-10-15 DIAGNOSIS — F419 Anxiety disorder, unspecified: Secondary | ICD-10-CM | POA: Insufficient documentation

## 2015-10-15 DIAGNOSIS — M7541 Impingement syndrome of right shoulder: Secondary | ICD-10-CM | POA: Insufficient documentation

## 2015-10-15 DIAGNOSIS — E785 Hyperlipidemia, unspecified: Secondary | ICD-10-CM | POA: Diagnosis not present

## 2015-10-15 DIAGNOSIS — E1169 Type 2 diabetes mellitus with other specified complication: Secondary | ICD-10-CM | POA: Diagnosis present

## 2015-10-15 DIAGNOSIS — I129 Hypertensive chronic kidney disease with stage 1 through stage 4 chronic kidney disease, or unspecified chronic kidney disease: Secondary | ICD-10-CM | POA: Diagnosis not present

## 2015-10-15 DIAGNOSIS — Z9989 Dependence on other enabling machines and devices: Secondary | ICD-10-CM

## 2015-10-15 DIAGNOSIS — M1990 Unspecified osteoarthritis, unspecified site: Secondary | ICD-10-CM | POA: Insufficient documentation

## 2015-10-15 DIAGNOSIS — E119 Type 2 diabetes mellitus without complications: Secondary | ICD-10-CM | POA: Insufficient documentation

## 2015-10-15 DIAGNOSIS — G4733 Obstructive sleep apnea (adult) (pediatric): Secondary | ICD-10-CM | POA: Diagnosis present

## 2015-10-15 DIAGNOSIS — M7501 Adhesive capsulitis of right shoulder: Secondary | ICD-10-CM | POA: Diagnosis not present

## 2015-10-15 DIAGNOSIS — F324 Major depressive disorder, single episode, in partial remission: Secondary | ICD-10-CM | POA: Diagnosis present

## 2015-10-15 DIAGNOSIS — I10 Essential (primary) hypertension: Secondary | ICD-10-CM | POA: Diagnosis present

## 2015-10-15 HISTORY — DX: Adhesive capsulitis of right shoulder: M75.01

## 2015-10-15 LAB — GLUCOSE, CAPILLARY
GLUCOSE-CAPILLARY: 139 mg/dL — AB (ref 65–99)
Glucose-Capillary: 187 mg/dL — ABNORMAL HIGH (ref 65–99)

## 2015-10-15 SURGERY — SHOULDER ARTHROSCOPY WITH SUBACROMIAL DECOMPRESSION AND DISTAL CLAVICLE EXCISION
Anesthesia: Regional | Site: Shoulder | Laterality: Right

## 2015-10-15 MED ORDER — OXYCODONE HCL 5 MG/5ML PO SOLN: 5.0000 mg | Freq: Once | ORAL | Status: DC | PRN

## 2015-10-15 MED ORDER — PHENYLEPHRINE HCL 10 MG/ML IJ SOLN
INTRAMUSCULAR | Status: DC | PRN
  Administered 2015-10-15 (×5): 80 ug via INTRAVENOUS

## 2015-10-15 MED ORDER — GLYCOPYRROLATE 0.2 MG/ML IJ SOLN
0.2000 mg | Freq: Once | INTRAMUSCULAR | Status: AC | PRN
  Administered 2015-10-15 (×2): 0.2 mg via INTRAVENOUS

## 2015-10-15 MED ORDER — HYDROMORPHONE HCL 1 MG/ML IJ SOLN: 0.2500 mg | INTRAMUSCULAR | Status: DC | PRN

## 2015-10-15 MED ORDER — EPHEDRINE 5 MG/ML INJ
INTRAVENOUS | Status: AC
  Filled 2015-10-15: qty 10

## 2015-10-15 MED ORDER — DEXAMETHASONE SODIUM PHOSPHATE 4 MG/ML IJ SOLN
INTRAMUSCULAR | Status: DC | PRN
  Administered 2015-10-15: 10 mg via INTRAVENOUS

## 2015-10-15 MED ORDER — SCOPOLAMINE 1 MG/3DAYS TD PT72: 1.0000 | MEDICATED_PATCH | Freq: Once | TRANSDERMAL | Status: DC | PRN

## 2015-10-15 MED ORDER — LACTATED RINGERS IV SOLN
INTRAVENOUS | Status: DC
  Administered 2015-10-15 (×2): via INTRAVENOUS

## 2015-10-15 MED ORDER — PHENYLEPHRINE 40 MCG/ML (10ML) SYRINGE FOR IV PUSH (FOR BLOOD PRESSURE SUPPORT)
PREFILLED_SYRINGE | INTRAVENOUS | Status: AC
  Filled 2015-10-15: qty 10

## 2015-10-15 MED ORDER — METHYLPREDNISOLONE ACETATE 80 MG/ML IJ SUSP
INTRAMUSCULAR | Status: DC | PRN
  Administered 2015-10-15: 10 mL via INTRA_ARTICULAR

## 2015-10-15 MED ORDER — SODIUM CHLORIDE 0.9 % IR SOLN
Status: DC | PRN
  Administered 2015-10-15: 3000 mL

## 2015-10-15 MED ORDER — SUCCINYLCHOLINE CHLORIDE 20 MG/ML IJ SOLN
INTRAMUSCULAR | Status: DC | PRN
  Administered 2015-10-15: 50 mg via INTRAVENOUS

## 2015-10-15 MED ORDER — BUPIVACAINE-EPINEPHRINE 0.25% -1:200000 IJ SOLN: INTRAMUSCULAR | Status: DC | PRN

## 2015-10-15 MED ORDER — FENTANYL CITRATE (PF) 100 MCG/2ML IJ SOLN
INTRAMUSCULAR | Status: DC | PRN
  Administered 2015-10-15: 100 ug via INTRAVENOUS

## 2015-10-15 MED ORDER — CEFAZOLIN SODIUM-DEXTROSE 2-4 GM/100ML-% IV SOLN
2.0000 g | INTRAVENOUS | Status: AC
  Administered 2015-10-15: 2 g via INTRAVENOUS

## 2015-10-15 MED ORDER — FENTANYL CITRATE (PF) 100 MCG/2ML IJ SOLN
50.0000 ug | INTRAMUSCULAR | Status: DC | PRN
  Administered 2015-10-15: 100 ug via INTRAVENOUS

## 2015-10-15 MED ORDER — OXYCODONE HCL 5 MG PO TABS: ORAL_TABLET | ORAL | 0 refills | Status: DC

## 2015-10-15 MED ORDER — FENTANYL CITRATE (PF) 100 MCG/2ML IJ SOLN
INTRAMUSCULAR | Status: AC
  Filled 2015-10-15: qty 2

## 2015-10-15 MED ORDER — LIDOCAINE HCL (CARDIAC) 20 MG/ML IV SOLN
INTRAVENOUS | Status: DC | PRN
  Administered 2015-10-15: 50 mg via INTRAVENOUS

## 2015-10-15 MED ORDER — MIDAZOLAM HCL 2 MG/2ML IJ SOLN
INTRAMUSCULAR | Status: AC
  Filled 2015-10-15: qty 2

## 2015-10-15 MED ORDER — PROPOFOL 10 MG/ML IV BOLUS
INTRAVENOUS | Status: AC
  Filled 2015-10-15: qty 20

## 2015-10-15 MED ORDER — CEFAZOLIN SODIUM-DEXTROSE 2-4 GM/100ML-% IV SOLN
INTRAVENOUS | Status: AC
  Filled 2015-10-15: qty 100

## 2015-10-15 MED ORDER — ONDANSETRON HCL 4 MG/2ML IJ SOLN: 4.0000 mg | Freq: Four times a day (QID) | INTRAMUSCULAR | Status: DC | PRN

## 2015-10-15 MED ORDER — GLYCOPYRROLATE 0.2 MG/ML IV SOSY
PREFILLED_SYRINGE | INTRAVENOUS | Status: AC
  Filled 2015-10-15: qty 3

## 2015-10-15 MED ORDER — LACTATED RINGERS IV SOLN
INTRAVENOUS | Status: DC
  Administered 2015-10-15: 11:00:00 via INTRAVENOUS

## 2015-10-15 MED ORDER — OXYCODONE HCL 5 MG PO TABS: 5.0000 mg | ORAL_TABLET | Freq: Once | ORAL | Status: DC | PRN

## 2015-10-15 MED ORDER — MIDAZOLAM HCL 5 MG/5ML IJ SOLN
INTRAMUSCULAR | Status: DC | PRN
  Administered 2015-10-15: 2 mg via INTRAVENOUS

## 2015-10-15 MED ORDER — PROPOFOL 10 MG/ML IV BOLUS
INTRAVENOUS | Status: DC | PRN
  Administered 2015-10-15: 200 mg via INTRAVENOUS

## 2015-10-15 MED ORDER — CYCLOBENZAPRINE HCL 10 MG PO TABS: ORAL_TABLET | ORAL | 1 refills | Status: DC

## 2015-10-15 MED ORDER — BUPIVACAINE-EPINEPHRINE (PF) 0.5% -1:200000 IJ SOLN
INTRAMUSCULAR | Status: DC | PRN
  Administered 2015-10-15: 30 mL via PERINEURAL

## 2015-10-15 MED ORDER — EPHEDRINE SULFATE 50 MG/ML IJ SOLN
INTRAMUSCULAR | Status: DC | PRN
  Administered 2015-10-15: 10 mg via INTRAVENOUS

## 2015-10-15 MED ORDER — MIDAZOLAM HCL 2 MG/2ML IJ SOLN
1.0000 mg | INTRAMUSCULAR | Status: DC | PRN
  Administered 2015-10-15: 2 mg via INTRAVENOUS

## 2015-10-15 MED ORDER — EPINEPHRINE HCL 1 MG/ML IJ SOLN
INTRAMUSCULAR | Status: AC
  Filled 2015-10-15: qty 1

## 2015-10-15 MED ORDER — BUPIVACAINE-EPINEPHRINE (PF) 0.25% -1:200000 IJ SOLN
INTRAMUSCULAR | Status: AC
  Filled 2015-10-15: qty 30

## 2015-10-15 MED ORDER — METHYLPREDNISOLONE ACETATE 80 MG/ML IJ SUSP
INTRAMUSCULAR | Status: AC
  Filled 2015-10-15: qty 1

## 2015-10-15 SURGICAL SUPPLY — 68 items
BENZOIN TINCTURE PRP APPL 2/3 (GAUZE/BANDAGES/DRESSINGS) IMPLANT
BLADE CUDA 5.5 (BLADE) IMPLANT
BLADE CUTTER GATOR 3.5 (BLADE) ×2 IMPLANT
BLADE GREAT WHITE 4.2 (BLADE) IMPLANT
BNDG COHESIVE 4X5 TAN STRL (GAUZE/BANDAGES/DRESSINGS) ×2 IMPLANT
BUR OVAL 6.0 (BURR) ×2 IMPLANT
CANNULA TWIST IN 8.25X7CM (CANNULA) IMPLANT
DECANTER SPIKE VIAL GLASS SM (MISCELLANEOUS) IMPLANT
DRAPE SHOULDER BEACH CHAIR (DRAPES) ×2 IMPLANT
DRAPE U-SHAPE 47X51 STRL (DRAPES) ×4 IMPLANT
DRSG PAD ABDOMINAL 8X10 ST (GAUZE/BANDAGES/DRESSINGS) ×2 IMPLANT
DURAPREP 26ML APPLICATOR (WOUND CARE) ×2 IMPLANT
ELECT REM PT RETURN 9FT ADLT (ELECTROSURGICAL) ×2
ELECTRODE REM PT RTRN 9FT ADLT (ELECTROSURGICAL) ×1 IMPLANT
GAUZE SPONGE 4X4 12PLY STRL (GAUZE/BANDAGES/DRESSINGS) ×2 IMPLANT
GAUZE XEROFORM 1X8 LF (GAUZE/BANDAGES/DRESSINGS) ×2 IMPLANT
GLOVE BIO SURGEON STRL SZ7 (GLOVE) IMPLANT
GLOVE BIOGEL PI IND STRL 7.0 (GLOVE) ×2 IMPLANT
GLOVE BIOGEL PI IND STRL 7.5 (GLOVE) ×1 IMPLANT
GLOVE BIOGEL PI INDICATOR 7.0 (GLOVE) ×2
GLOVE BIOGEL PI INDICATOR 7.5 (GLOVE) ×1
GLOVE ECLIPSE 6.5 STRL STRAW (GLOVE) ×2 IMPLANT
GLOVE SS BIOGEL STRL SZ 7.5 (GLOVE) ×1 IMPLANT
GLOVE SUPERSENSE BIOGEL SZ 7.5 (GLOVE) ×1
GOWN STRL REUS W/ TWL LRG LVL3 (GOWN DISPOSABLE) ×3 IMPLANT
GOWN STRL REUS W/TWL LRG LVL3 (GOWN DISPOSABLE) ×3
LOOP 2 FIBERLINK CLOSED (SUTURE) IMPLANT
MANIFOLD NEPTUNE II (INSTRUMENTS) ×2 IMPLANT
NDL SAFETY ECLIPSE 18X1.5 (NEEDLE) ×1 IMPLANT
NDL SUT 6 .5 CRC .975X.05 MAYO (NEEDLE) IMPLANT
NEEDLE 1/2 CIR CATGUT .05X1.09 (NEEDLE) IMPLANT
NEEDLE HYPO 18GX1.5 SHARP (NEEDLE) ×1
NEEDLE MAYO TAPER (NEEDLE)
NEEDLE SCORPION MULTI FIRE (NEEDLE) IMPLANT
PACK ARTHROSCOPY DSU (CUSTOM PROCEDURE TRAY) ×2 IMPLANT
PACK BASIN DAY SURGERY FS (CUSTOM PROCEDURE TRAY) ×2 IMPLANT
PAD ALCOHOL SWAB (MISCELLANEOUS) ×4 IMPLANT
PENCIL BUTTON HOLSTER BLD 10FT (ELECTRODE) IMPLANT
SET ARTHROSCOPY TUBING (MISCELLANEOUS) ×1
SET ARTHROSCOPY TUBING LN (MISCELLANEOUS) ×1 IMPLANT
SHEET MEDIUM DRAPE 40X70 STRL (DRAPES) IMPLANT
SLEEVE SCD COMPRESS KNEE MED (MISCELLANEOUS) IMPLANT
SLING ARM FOAM STRAP LRG (SOFTGOODS) IMPLANT
SLING ARM IMMOBILIZER MED (SOFTGOODS) IMPLANT
SLING ARM MED ADULT FOAM STRAP (SOFTGOODS) IMPLANT
SLING ARM XL FOAM STRAP (SOFTGOODS) IMPLANT
SLING ULTRA III MED (ORTHOPEDIC SUPPLIES) IMPLANT
SPONGE LAP 4X18 X RAY DECT (DISPOSABLE) IMPLANT
STRIP CLOSURE SKIN 1/2X4 (GAUZE/BANDAGES/DRESSINGS) IMPLANT
SUT ETHILON 3 0 PS 1 (SUTURE) ×2 IMPLANT
SUT FIBERWIRE #2 38 T-5 BLUE (SUTURE)
SUT PDS AB 2-0 CT2 27 (SUTURE) IMPLANT
SUT PROLENE 3 0 PS 2 (SUTURE) IMPLANT
SUT TIGER TAPE 7 IN WHITE (SUTURE) IMPLANT
SUT VIC AB 0 SH 27 (SUTURE) IMPLANT
SUT VIC AB 2-0 PS2 27 (SUTURE) IMPLANT
SUT VIC AB 2-0 SH 27 (SUTURE)
SUT VIC AB 2-0 SH 27XBRD (SUTURE) IMPLANT
SUTURE FIBERWR #2 38 T-5 BLUE (SUTURE) IMPLANT
SYR 5ML LL (SYRINGE) ×2 IMPLANT
SYR BULB 3OZ (MISCELLANEOUS) IMPLANT
TAPE FIBER 2MM 7IN #2 BLUE (SUTURE) IMPLANT
TAPE HYPAFIX 6X30 (GAUZE/BANDAGES/DRESSINGS) IMPLANT
TAPE STRIPS DRAPE STRL (GAUZE/BANDAGES/DRESSINGS) ×2 IMPLANT
TOWEL OR 17X24 6PK STRL BLUE (TOWEL DISPOSABLE) ×2 IMPLANT
TUBE CONNECTING 20X1/4 (TUBING) IMPLANT
WAND STAR VAC 90 (SURGICAL WAND) ×2 IMPLANT
WATER STERILE IRR 1000ML POUR (IV SOLUTION) ×2 IMPLANT

## 2015-10-15 NOTE — Anesthesia Preprocedure Evaluation (Signed)
Anesthesia Evaluation  Patient identified by MRN, date of birth, ID band Patient awake    Reviewed: Allergy & Precautions, H&P , NPO status , Patient's Chart, lab work & pertinent test results  Airway Mallampati: II   Neck ROM: full    Dental   Pulmonary sleep apnea ,    breath sounds clear to auscultation       Cardiovascular hypertension,  Rhythm:regular Rate:Normal     Neuro/Psych PSYCHIATRIC DISORDERS Anxiety Depression    GI/Hepatic   Endo/Other  diabetes, Type 2  Renal/GU      Musculoskeletal  (+) Arthritis ,   Abdominal   Peds  Hematology   Anesthesia Other Findings   Reproductive/Obstetrics                             Anesthesia Physical Anesthesia Plan  ASA: II  Anesthesia Plan: General and Regional   Post-op Pain Management:  Regional for Post-op pain   Induction: Intravenous  Airway Management Planned: Oral ETT  Additional Equipment:   Intra-op Plan:   Post-operative Plan: Extubation in OR  Informed Consent: I have reviewed the patients History and Physical, chart, labs and discussed the procedure including the risks, benefits and alternatives for the proposed anesthesia with the patient or authorized representative who has indicated his/her understanding and acceptance.     Plan Discussed with: CRNA, Anesthesiologist and Surgeon  Anesthesia Plan Comments:         Anesthesia Quick Evaluation

## 2015-10-15 NOTE — Anesthesia Procedure Notes (Signed)
Anesthesia Regional Block:  Interscalene brachial plexus block  Pre-Anesthetic Checklist: ,, timeout performed, Correct Patient, Correct Site, Correct Laterality, Correct Procedure, Correct Position, site marked, Risks and benefits discussed,  Surgical consent,  Pre-op evaluation,  At surgeon's request and post-op pain management  Laterality: Right  Prep: chloraprep       Needles:  Injection technique: Single-shot  Needle Type: Echogenic Stimulator Needle     Needle Length: 5cm 5 cm Needle Gauge: 22 and 22 G    Additional Needles:  Procedures: ultrasound guided (picture in chart) and nerve stimulator Interscalene brachial plexus block  Nerve Stimulator or Paresthesia:  Response: biceps flexion, 0.45 mA,   Additional Responses:   Narrative:  Start time: 10/15/2015 11:01 AM End time: 10/15/2015 11:11 AM Injection made incrementally with aspirations every 5 mL.  Performed by: Personally  Anesthesiologist: Khaylee Mcevoy  Additional Notes: Functioning IV was confirmed and monitors were applied.  A 34mm 22ga Arrow echogenic stimulator needle was used. Sterile prep and drape,hand hygiene and sterile gloves were used.  Negative aspiration and negative test dose prior to incremental administration of local anesthetic. The patient tolerated the procedure well.  Ultrasound guidance: relevent anatomy identified, needle position confirmed, local anesthetic spread visualized around nerve(s), vascular puncture avoided.  Image printed for medical record.

## 2015-10-15 NOTE — Anesthesia Postprocedure Evaluation (Signed)
Anesthesia Post Note  Patient: Amanda Cooper  Procedure(s) Performed: Procedure(s) (LRB): SHOULDER ARTHROSCOPY WITH DISTAL CLAVICULECTOMY and extentsive debridement with lysis resect adhesions with manipulation (Right)  Patient location during evaluation: PACU Anesthesia Type: General Level of consciousness: awake and alert Pain management: pain level controlled Vital Signs Assessment: post-procedure vital signs reviewed and stable Respiratory status: spontaneous breathing, nonlabored ventilation and respiratory function stable Cardiovascular status: blood pressure returned to baseline and stable Postop Assessment: no signs of nausea or vomiting Anesthetic complications: no    Last Vitals:  Vitals:   10/15/15 1345 10/15/15 1415  BP: 136/68 138/72  Pulse: (!) 109 (!) 102  Resp: 17 18  Temp:  36.8 C    Last Pain:  Vitals:   10/15/15 1415  TempSrc:   PainSc: 0-No pain                 Vercie Pokorny A

## 2015-10-15 NOTE — Discharge Instructions (Signed)
Husband to assist patient with raising the arm over head every hour Keep dressing clean and dry for 3 days. May shower on Friday. Cover incisions with bandaids Must remain in sling when not doing overhead arm raises Follow up for physical therapy at Dr Archie Endo office Wednesday 9/6 [place]   Post Anesthesia Home Care Instructions  Activity: Get plenty of rest for the remainder of the day. A responsible adult should stay with you for 24 hours following the procedure.  For the next 24 hours, DO NOT: -Drive a car -Paediatric nurse -Drink alcoholic beverages -Take any medication unless instructed by your physician -Make any legal decisions or sign important papers.  Meals: Start with liquid foods such as gelatin or soup. Progress to regular foods as tolerated. Avoid greasy, spicy, heavy foods. If nausea and/or vomiting occur, drink only clear liquids until the nausea and/or vomiting subsides. Call your physician if vomiting continues.  Special Instructions/Symptoms: Your throat may feel dry or sore from the anesthesia or the breathing tube placed in your throat during surgery. If this causes discomfort, gargle with warm salt water. The discomfort should disappear within 24 hours.  If you had a scopolamine patch placed behind your ear for the management of post- operative nausea and/or vomiting:  1. The medication in the patch is effective for 72 hours, after which it should be removed.  Wrap patch in a tissue and discard in the trash. Wash hands thoroughly with soap and water. 2. You may remove the patch earlier than 72 hours if you experience unpleasant side effects which may include dry mouth, dizziness or visual disturbances. 3. Avoid touching the patch. Wash your hands with soap and water after contact with the patch.   Regional Anesthesia Blocks  1. Numbness or the inability to move the "blocked" extremity may last from 3-48 hours after placement. The length of time depends on the  medication injected and your individual response to the medication. If the numbness is not going away after 48 hours, call your surgeon.  2. The extremity that is blocked will need to be protected until the numbness is gone and the  Strength has returned. Because you cannot feel it, you will need to take extra care to avoid injury. Because it may be weak, you may have difficulty moving it or using it. You may not know what position it is in without looking at it while the block is in effect.  3. For blocks in the legs and feet, returning to weight bearing and walking needs to be done carefully. You will need to wait until the numbness is entirely gone and the strength has returned. You should be able to move your leg and foot normally before you try and bear weight or walk. You will need someone to be with you when you first try to ensure you do not fall and possibly risk injury.  4. Bruising and tenderness at the needle site are common side effects and will resolve in a few days.  5. Persistent numbness or new problems with movement should be communicated to the surgeon or the [facility] [phone] [place] [phone]).

## 2015-10-15 NOTE — Progress Notes (Signed)
Assisted Dr. Hodierne with right, ultrasound guided, interscalene  block. Side rails up, monitors on throughout procedure. See vital signs in flow sheet. Tolerated Procedure well. 

## 2015-10-15 NOTE — Anesthesia Procedure Notes (Signed)
Procedure Name: Intubation Date/Time: 10/15/2015 12:19 PM Performed by: Toula Moos L Pre-anesthesia Checklist: Patient identified, Emergency Drugs available, Suction available, Patient being monitored and Timeout performed Patient Re-evaluated:Patient Re-evaluated prior to inductionOxygen Delivery Method: Circle system utilized Preoxygenation: Pre-oxygenation with 100% oxygen Intubation Type: IV induction Ventilation: Mask ventilation without difficulty Laryngoscope Size: Miller and 3 Grade View: Grade III Tube type: Oral Number of attempts: 1 Airway Equipment and Method: Stylet and Oral airway Placement Confirmation: ETT inserted through vocal cords under direct vision,  positive ETCO2 and breath sounds checked- equal and bilateral Secured at: 22 cm Tube secured with: Tape Dental Injury: Teeth and Oropharynx as per pre-operative assessment

## 2015-10-15 NOTE — Transfer of Care (Signed)
Immediate Anesthesia Transfer of Care Note  Patient: Amanda Cooper  Procedure(s) Performed: Procedure(s) with comments: SHOULDER ARTHROSCOPY WITH DISTAL CLAVICULECTOMY and extentsive debridement with lysis resect adhesions with manipulation (Right) - Pre/Post Scalene block  Patient Location: PACU  Anesthesia Type:GA combined with regional for post-op pain  Level of Consciousness: awake and patient cooperative  Airway & Oxygen Therapy: Patient Spontanous Breathing and Patient connected to face mask oxygen  Post-op Assessment: Report given to RN and Post -op Vital signs reviewed and stable  Post vital signs: Reviewed and stable  Last Vitals:  Vitals:   10/15/15 1113 10/15/15 1311  BP:  (!) 150/73  Pulse: 98 (!) 120  Resp: (!) 21 13  Temp:  (P) 37 C    Last Pain:  Vitals:   10/15/15 1039  TempSrc: Oral  PainSc: 8       Patients Stated Pain Goal: 4 ([id number])  Complications: No apparent anesthesia complications

## 2015-10-15 NOTE — Interval H&P Note (Signed)
History and Physical Interval Note:  10/15/2015 11:46 AM  Amanda Cooper  has presented today for surgery, with the diagnosis of right shoulder bursitis, impingement syndrome right shoulder  The various methods of treatment have been discussed with the patient and family. After consideration of risks, benefits and other options for treatment, the patient has consented to  Procedure(s) with comments: SHOULDER ARTHROSCOPY WITH DISTAL CLAVICULECTOMY and extentsive debridement with lysis resect adhesions with manipulation (Right) - Pre/Post Scalene block as a surgical intervention .  The patient's history has been reviewed, patient examined, no change in status, stable for surgery.  I have reviewed the patient's chart and labs.  Questions were answered to the patient's satisfaction.     Elsie Saas A

## 2015-10-16 NOTE — Op Note (Signed)
NAME:  Amanda Cooper, Amanda Cooper             ACCOUNT NO.:  [id number]  MEDICAL RECORD NO.:  [id number]  LOCATION:                                 FACILITY:  PHYSICIAN:  Audree Camel. Noemi Chapel, M.D. DATE OF BIRTH:  01/08/1956  DATE OF PROCEDURE:  10/15/2015 DATE OF DISCHARGE:                              OPERATIVE REPORT   PREOPERATIVE DIAGNOSES: 1. 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 2. Right shoulder chronic nontraumatic adhesive capsulitis. 3. Right shoulder chronic nondramatic impingement.  POSTOPERATIVE DIAGNOSES: 1. 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 2. Right shoulder chronic nontraumatic adhesive capsulitis. 3. Right shoulder chronic nondramatic impingement.  PROCEDURE: 1. Right shoulder EUA followed by manipulation. 2. Right shoulder arthroscopic lysis of adhesions. 3. Right shoulder debridement partial rotator cuff and partial labrum     tear. 4. Right shoulder subacromial decompression.  SURGEON:  Elsie Saas, MD.  ANESTHESIA:  General.  OPERATIVE TIME:  45 minutes.  COMPLICATIONS:  None.  INDICATION FOR PROCEDURE:  Amanda Cooper is a 60 year old woman who has had over a year of right shoulder pain increasing in nature with exam and MRI documenting partial rotator cuff tear, partial labrum tear, with adhesive capsulitis and impingement.  She has failed multiple conservative modalities and is now to undergo arthroscopy.  DESCRIPTION OF PROCEDURE:  Amanda Cooper was brought to the operating room on October 15, 2015, after an interscalene block, was placed in the holding room by Anesthesia.  She was placed on operative table in supine position.  She received antibiotics preoperatively for prophylaxis.  After being placed under general anesthesia, her right shoulder was examined.  Initial range of motion showed 80% range of motion.  A gentle manipulation was carried  out breaking up adhesions and improving range of motion to full range of motion.  Shoulder remained stable ligamentous exam.  She was then placed in a beach chair position and her shoulder and arm was prepped using sterile DuraPrep and draped using sterile technique.  Time-out procedure was called the correct right shoulder identified.  Initially, through a posterior arthroscopic portal, the arthroscope with a pump attached was placed into an anterior portal and arthroscopic probe was placed.  On initial inspection, the articular cartilage in the glenohumeral joint was intact.  She had partial tearing of the anterior and superior labrum 25% which was debrided.  The anterior-inferior labrum and anterior-inferior glenohumeral ligament complex was intact.  Biceps tendon anchor and biceps tendon was intact.  Rotator cuff showed a partial tear of the supraspinatus 25% which was debrided.  The rest of the rotator cuff was intact.  There was a large of and hemorrhagic synovitis which was debrided and cauterized and adhesions were lysed and cauterized.  At this point, the subacromial space was entered and a lateral arthroscopic portal was made.  Large amount of bursitis was resected.  The rotator cuff was frayed with partial tearing 25% on the bursal surface and this was debrided.  Impingement was noted as the undersurface of the acromion was digging into the rotator cuff and a subacromial decompression was carried out removing  6-8 mm of the undersurface of the anterior, anterolateral, and anteromedial acromion and CA ligament release was carried out as well.  The [facility] joint was not pathologic and thus was not resected.  This point, I felt that all pathology had been satisfactorily addressed.  The instruments were removed.  Portals closed with 3-0 nylon suture.  The shoulder was then injected with 80 mg of Depo-Medrol and 10 mL of 0.25% Marcaine with epinephrine.  Sterile dressings were applied and  the patient was awakened and taken to recovery room in stable condition.  FOLLOWUP CARE:  Amanda Cooper will be followed as an outpatient, on oxycodone and Flexeril with early physical therapy.  She will be seen back in office in a week for sutures out and followup.     Amanda Cooper A. Noemi Chapel, M.D.   ______________________________ Audree Camel. Noemi Chapel, M.D.    RAW/MEDQ  D:  10/15/2015  T:  10/16/2015  Job:  [id number]

## 2015-11-09 ENCOUNTER — Other Ambulatory Visit: Payer: Self-pay | Admitting: Internal Medicine

## 2015-11-09 DIAGNOSIS — E119 Type 2 diabetes mellitus without complications: Secondary | ICD-10-CM

## 2015-11-13 ENCOUNTER — Telehealth: Payer: Self-pay | Admitting: Internal Medicine

## 2015-11-13 NOTE — Telephone Encounter (Signed)
[facility] @ [facility] requested most recent note on patient to qualify for new CPAP machine and supplies. Faxed, she advised patient approved CPAP orded.

## 2015-12-17 ENCOUNTER — Other Ambulatory Visit: Payer: Self-pay | Admitting: Physician Assistant

## 2015-12-24 ENCOUNTER — Encounter: Payer: Self-pay | Admitting: Physician Assistant

## 2016-01-07 ENCOUNTER — Ambulatory Visit (INDEPENDENT_AMBULATORY_CARE_PROVIDER_SITE_OTHER): Payer: 59 | Admitting: Physician Assistant

## 2016-01-07 ENCOUNTER — Encounter: Payer: Self-pay | Admitting: Physician Assistant

## 2016-01-07 VITALS — BP 130/80 | HR 86 | Resp 16 | Ht 63.5 in | Wt 195.6 lb

## 2016-01-07 DIAGNOSIS — E1122 Type 2 diabetes mellitus with diabetic chronic kidney disease: Secondary | ICD-10-CM | POA: Diagnosis not present

## 2016-01-07 DIAGNOSIS — Z0001 Encounter for general adult medical examination with abnormal findings: Secondary | ICD-10-CM

## 2016-01-07 DIAGNOSIS — I10 Essential (primary) hypertension: Secondary | ICD-10-CM | POA: Diagnosis not present

## 2016-01-07 DIAGNOSIS — Z1159 Encounter for screening for other viral diseases: Secondary | ICD-10-CM | POA: Diagnosis not present

## 2016-01-07 DIAGNOSIS — Z79899 Other long term (current) drug therapy: Secondary | ICD-10-CM

## 2016-01-07 DIAGNOSIS — R350 Frequency of micturition: Secondary | ICD-10-CM | POA: Diagnosis not present

## 2016-01-07 DIAGNOSIS — N182 Chronic kidney disease, stage 2 (mild): Secondary | ICD-10-CM

## 2016-01-07 DIAGNOSIS — D649 Anemia, unspecified: Secondary | ICD-10-CM

## 2016-01-07 DIAGNOSIS — E559 Vitamin D deficiency, unspecified: Secondary | ICD-10-CM

## 2016-01-07 DIAGNOSIS — E785 Hyperlipidemia, unspecified: Secondary | ICD-10-CM | POA: Diagnosis not present

## 2016-01-07 LAB — CBC WITH DIFFERENTIAL/PLATELET
BASOS PCT: 1 %
Basophils Absolute: 76 cells/uL (ref 0–200)
EOS PCT: 5 %
Eosinophils Absolute: 380 cells/uL (ref 15–500)
HCT: 44.1 % (ref 35.0–45.0)
HEMOGLOBIN: 14.8 g/dL (ref 11.7–15.5)
LYMPHS ABS: 2052 {cells}/uL (ref 850–3900)
Lymphocytes Relative: 27 %
MCH: 31.7 pg (ref 27.0–33.0)
MCHC: 33.6 g/dL (ref 32.0–36.0)
MCV: 94.4 fL (ref 80.0–100.0)
MONOS PCT: 6 %
MPV: 10.3 fL (ref 7.5–12.5)
Monocytes Absolute: 456 cells/uL (ref 200–950)
NEUTROS ABS: 4636 {cells}/uL (ref 1500–7800)
Neutrophils Relative %: 61 %
PLATELETS: 212 10*3/uL (ref 140–400)
RBC: 4.67 MIL/uL (ref 3.80–5.10)
RDW: 14 % (ref 11.0–15.0)
WBC: 7.6 10*3/uL (ref 3.8–10.8)

## 2016-01-07 LAB — LIPID PANEL
CHOL/HDL RATIO: 3.9 ratio (ref <5.0)
CHOLESTEROL: 142 mg/dL (ref <200)
HDL: 36 mg/dL — AB (ref >50)
LDL Cholesterol: 58 mg/dL (ref <100)
Triglycerides: 241 mg/dL — ABNORMAL HIGH (ref <150)
VLDL: 48 mg/dL — AB (ref <30)

## 2016-01-07 LAB — IRON AND TIBC
%SAT: 24 % (ref 11–50)
IRON: 86 ug/dL (ref 45–160)
TIBC: 361 ug/dL (ref 250–450)
UIBC: 275 ug/dL (ref 125–400)

## 2016-01-07 LAB — HEPATIC FUNCTION PANEL
ALT: 40 U/L — AB (ref 6–29)
AST: 30 U/L (ref 10–35)
Albumin: 4.5 g/dL (ref 3.6–5.1)
Alkaline Phosphatase: 71 U/L (ref 33–130)
BILIRUBIN DIRECT: 0.1 mg/dL (ref <=0.2)
BILIRUBIN INDIRECT: 0.4 mg/dL (ref 0.2–1.2)
BILIRUBIN TOTAL: 0.5 mg/dL (ref 0.2–1.2)
Total Protein: 6.7 g/dL (ref 6.1–8.1)

## 2016-01-07 LAB — BASIC METABOLIC PANEL WITH GFR
BUN: 11 mg/dL (ref 7–25)
CHLORIDE: 103 mmol/L (ref 98–110)
CO2: 24 mmol/L (ref 20–31)
CREATININE: 0.71 mg/dL (ref 0.50–0.99)
Calcium: 9.7 mg/dL (ref 8.6–10.4)
GFR, Est African American: >89 mL/min (ref >=60)
Glucose, Bld: 172 mg/dL — ABNORMAL HIGH (ref 65–99)
Potassium: 4.3 mmol/L (ref 3.5–5.3)
SODIUM: 142 mmol/L (ref 135–146)

## 2016-01-07 LAB — TSH: TSH: 1.33 m[IU]/L

## 2016-01-07 LAB — URIC ACID: Uric Acid, Serum: 6.8 mg/dL (ref 2.5–7.0)

## 2016-01-07 LAB — MAGNESIUM: Magnesium: 2 mg/dL (ref 1.5–2.5)

## 2016-01-07 MED ORDER — CIPROFLOXACIN HCL 500 MG PO TABS: 500.0000 mg | ORAL_TABLET | Freq: Two times a day (BID) | ORAL | 0 refills | Status: AC

## 2016-01-07 NOTE — Progress Notes (Signed)
Subjective:    Patient ID: Amanda Cooper, female    DOB: 04/02/1955, 60 y.o.   MRN: [id number]  HPI 60 y.o. WF with history of DM2 with CKD, HTN presents with possible UTI x 3 days. She has had urgency, frequency, pressure/dysuria. No discharge, itching, back pain, hematuria, fever, chills.   Her blood pressure has been controlled at home, today their BP is BP: 130/80  She does not workout. She denies chest pain, shortness of breath, dizziness.  She is on cholesterol medication and denies myalgias. Her cholesterol is at goal. The cholesterol last visit was:   Lab Results  Component Value Date   CHOL 149 09/12/2015   HDL 37 (L) 09/12/2015   LDLCALC 59 09/12/2015   TRIG 266 (H) 09/12/2015   CHOLHDL 4.0 09/12/2015   She has been working on diet and exercise for Diabetes with diabetic chronic kidney disease, she is on bASA, she is on ACE/ARB, and denies paresthesia of the feet, polydipsia and visual disturbances. Last A1C was:  Lab Results  Component Value Date   HGBA1C 7.6 (H) 09/12/2015     Lab Results  Component Value Date   GFRNONAA 78 09/12/2015    Patient is on Vitamin D supplement.   Lab Results  Component Value Date   VD25OH 47 11/15/2014      BMI is Body mass index is 34.11 kg/m., she is working on diet and exercise. Wt Readings from Last 3 Encounters:  01/07/16 195 lb 9.6 oz (88.7 kg)  10/15/15 188 lb (85.3 kg)  09/12/15 189 lb (85.7 kg)    Blood pressure 130/80, pulse 86, resp. rate 16, height 5' 3.5" (1.613 m), weight 195 lb 9.6 oz (88.7 kg), SpO2 97 %.  Medications Current Outpatient Prescriptions on File Prior to Visit  Medication Sig  . aspirin 81 MG tablet Take 81 mg by mouth daily.  . Calcium Carbonate-Vit D-Min (CALCIUM 1200 PO) Take by mouth daily.  . Canagliflozin-Metformin HCl ER (INVOKAMET XR) [phone] MG TB24 Take 300 mg by mouth daily. 2 pills a day  . Cholecalciferol (VITAMIN D) 2000 UNITS tablet Take 2,000 Units by mouth 2 (two) times  daily.  . cyclobenzaprine (FLEXERIL) 10 MG tablet 1 po q 8 hrs prn spasm  . glimepiride (AMARYL) 4 MG tablet TAKE 1 TABLET (4 MG TOTAL) BY MOUTH DAILY WITH BREAKFAST.  Marland Kitchen linagliptin (TRADJENTA) 5 MG TABS tablet Take 1 tablet (5 mg total) by mouth daily.  Marland Kitchen lisinopril (PRINIVIL,ZESTRIL) 10 MG tablet TAKE 1 TABLET (10 MG TOTAL) BY MOUTH DAILY.  . Multiple Vitamin (MULTIVITAMIN) capsule Take 1 capsule by mouth daily.  . Omega-3 Fatty Acids (FISH OIL) 1000 MG CAPS Take by mouth daily.  Glory Rosebush VERIO test strip TEST SUGAR 2-3 TIMES A DAY   No current facility-administered medications on file prior to visit.     Problem list She has Hyperlipidemia; CKD stage 2 due to type 2 diabetes mellitus ([place]); Anemia; Anxiety; Depression; Colon polyps; Essential hypertension; Gout; Obstructive sleep apnea on CPAP; Vitamin D deficiency; and Secondary adhesive capsulitis of right shoulder on her problem list.   Review of Systems  Constitutional: Negative for chills.  HENT: Negative.   Respiratory: Negative.   Cardiovascular: Negative.   Gastrointestinal: Negative.  Negative for nausea and vomiting.  Genitourinary: Positive for dysuria, frequency and urgency. Negative for decreased urine volume, difficulty urinating, dyspareunia, enuresis, flank pain, genital sores, hematuria, menstrual problem, pelvic pain, vaginal bleeding and vaginal discharge.  Objective:   Physical Exam  Constitutional: She is oriented to person, place, and time. She appears well-developed and well-nourished.  Neck: Normal range of motion. Neck supple.  Cardiovascular: Normal rate and regular rhythm.   Pulmonary/Chest: Effort normal and breath sounds normal.  Abdominal: Soft. Bowel sounds are normal. She exhibits no distension and no mass. There is tenderness (suprapubic). There is no rebound and no guarding.  Musculoskeletal: Normal range of motion. She exhibits no tenderness.  Neurological: She is alert and oriented to  person, place, and time.  Skin: Skin is warm and dry.       Assessment & Plan:  T[place]a was seen today for acute visit.  Diagnoses and all orders for this visit:  Urinary frequency -     Urinalysis, Routine w reflex microscopic (not at [facility]) -     Urine culture -     ciprofloxacin (CIPRO) 500 MG tablet; Take 1 tablet (500 mg total) by mouth 2 (two) times daily.  Essential hypertension - continue medications, DASH diet, exercise and monitor at home. Call if greater than 130/80.  -     CBC with Differential/Platelet -     BASIC METABOLIC PANEL WITH GFR -     Hepatic function panel -     TSH -     Microalbumin / creatinine urine ratio  CKD stage 2 due to type 2 diabetes mellitus ([place]) Discussed general issues about diabetes pathophysiology and management., Educational material distributed., Suggested low cholesterol diet., Encouraged aerobic exercise., Discussed foot care., Reminded to get yearly retinal exam. -     BASIC METABOLIC PANEL WITH GFR -     Hemoglobin A1c -     Uric acid  Hyperlipidemia, unspecified hyperlipidemia type -continue medications, check lipids, decrease fatty foods, increase activity.  -     Lipid panel  Vitamin D deficiency -     VITAMIN D 25 Hydroxy (Vit-D Deficiency, Fractures)  Medication management -     Magnesium  Encounter for general adult medical examination with abnormal findings -     CBC with Differential/Platelet -     BASIC METABOLIC PANEL WITH GFR -     Hepatic function panel -     TSH -     Lipid panel -     Hemoglobin A1c -     Magnesium -     VITAMIN D 25 Hydroxy (Vit-D Deficiency, Fractures) -     Microalbumin / creatinine urine ratio -     Iron and TIBC -     Ferritin -     Vitamin B12 -     Uric acid  Morbid obesity (HCC) - long discussion about weight loss, diet, and exercise  Screening for viral disease -     HIV antibody -     Hepatitis C antibody  Anemia, unspecified type -     Iron and TIBC -     Ferritin -      Vitamin B12   Got labs for CPE in Jan today, has met out of pocket.

## 2016-01-07 NOTE — Patient Instructions (Signed)
Your A1C is a measure of your sugar over the past 3 months and is not affected by what you have eaten over the past few days. Diabetes increases your chances of stroke and heart attack over 300 % and is the leading cause of blindness and kidney failure in the [place]. Please make sure you decrease bad carbs like white bread, white rice, potatoes, corn, soft drinks, pasta, cereals, refined sugars, sweet tea, dried fruits, and fruit juice. Good carbs are okay to eat in moderation like sweet potatoes, brown rice, whole grain pasta/bread, most fruit (except dried fruit) and you can eat as many veggies as you want.   Greater than 6.5 is considered diabetic. Between 6.4 and 5.7 is prediabetic If your A1C is less than 5.7 you are NOT diabetic.  Targets for Glucose Readings: Time of Check Target for patients WITHOUT Diabetes Target for DIABETICS  Before Meals Less than 100  less than 150  Two hours after meals Less than 200  Less than 250       Bad carbs also include fruit juice, alcohol, and sweet tea. These are empty calories that do not signal to your brain that you are full.   Please remember the good carbs are still carbs which convert into sugar. So please measure them out no more than 1/2-1 cup of rice, oatmeal, pasta, and beans  Veggies are however free foods! Pile them on.   Not all fruit is created equal. Please see the list below, the fruit at the bottom is higher in sugars than the fruit at the top. Please avoid all dried fruits.     

## 2016-01-08 LAB — URINALYSIS, ROUTINE W REFLEX MICROSCOPIC
Bilirubin Urine: NEGATIVE
Ketones, ur: NEGATIVE
NITRITE: NEGATIVE
Specific Gravity, Urine: 1.035 (ref 1.001–1.035)
pH: 6 (ref 5.0–8.0)

## 2016-01-08 LAB — URINALYSIS, MICROSCOPIC ONLY
BACTERIA UA: NONE SEEN [HPF]
CASTS: NONE SEEN [LPF]
CRYSTALS: NONE SEEN [HPF]
SQUAMOUS EPITHELIAL / LPF: NONE SEEN [HPF] (ref <=5)
WBC, UA: >60 WBC/HPF — AB (ref <=5)
YEAST: NONE SEEN [HPF]

## 2016-01-08 LAB — FERRITIN: Ferritin: 83 ng/mL (ref 20–288)

## 2016-01-08 LAB — VITAMIN B12: Vitamin B-12: 348 pg/mL (ref 200–1100)

## 2016-01-08 LAB — HEPATITIS C ANTIBODY: HCV AB: NEGATIVE

## 2016-01-08 LAB — HIV ANTIBODY (ROUTINE TESTING W REFLEX): HIV 1&2 Ab, 4th Generation: NONREACTIVE

## 2016-01-08 LAB — VITAMIN D 25 HYDROXY (VIT D DEFICIENCY, FRACTURES): Vit D, 25-Hydroxy: 57 ng/mL (ref 30–100)

## 2016-01-08 LAB — MICROALBUMIN / CREATININE URINE RATIO
CREATININE, URINE: 58 mg/dL (ref 20–320)
MICROALB UR: 25.3 mg/dL
Microalb Creat Ratio: 436 mcg/mg creat — ABNORMAL HIGH (ref <30)

## 2016-01-08 LAB — HEMOGLOBIN A1C
HEMOGLOBIN A1C: 7.8 % — AB (ref <5.7)
Mean Plasma Glucose: 177 mg/dL

## 2016-01-10 LAB — URINE CULTURE

## 2016-01-15 ENCOUNTER — Other Ambulatory Visit: Payer: Self-pay | Admitting: Internal Medicine

## 2016-01-19 ENCOUNTER — Other Ambulatory Visit: Payer: Self-pay | Admitting: Physician Assistant

## 2016-01-22 ENCOUNTER — Encounter: Payer: Self-pay | Admitting: Physician Assistant

## 2016-01-27 ENCOUNTER — Encounter: Payer: Self-pay | Admitting: Physician Assistant

## 2016-02-13 DIAGNOSIS — G4733 Obstructive sleep apnea (adult) (pediatric): Secondary | ICD-10-CM | POA: Diagnosis not present

## 2016-02-20 DIAGNOSIS — Z1231 Encounter for screening mammogram for malignant neoplasm of breast: Secondary | ICD-10-CM | POA: Diagnosis not present

## 2016-02-23 ENCOUNTER — Other Ambulatory Visit: Payer: Self-pay | Admitting: Internal Medicine

## 2016-02-23 DIAGNOSIS — E119 Type 2 diabetes mellitus without complications: Secondary | ICD-10-CM

## 2016-02-28 ENCOUNTER — Encounter: Payer: Self-pay | Admitting: Physician Assistant

## 2016-02-28 ENCOUNTER — Other Ambulatory Visit: Payer: Self-pay | Admitting: Physician Assistant

## 2016-02-28 ENCOUNTER — Other Ambulatory Visit (HOSPITAL_COMMUNITY)
Admission: RE | Admit: 2016-02-28 | Discharge: 2016-02-28 | Disposition: A | Discharge status: Home / Self Care | Payer: 59 | Source: Ambulatory Visit | Attending: Physician Assistant | Admitting: Physician Assistant

## 2016-02-28 ENCOUNTER — Ambulatory Visit (INDEPENDENT_AMBULATORY_CARE_PROVIDER_SITE_OTHER): Payer: 59 | Admitting: Physician Assistant

## 2016-02-28 VITALS — BP 124/70 | HR 86 | Temp 98.2°F | Resp 16 | Ht 63.5 in | Wt 188.0 lb

## 2016-02-28 DIAGNOSIS — N393 Stress incontinence (female) (male): Secondary | ICD-10-CM

## 2016-02-28 DIAGNOSIS — Z1151 Encounter for screening for human papillomavirus (HPV): Secondary | ICD-10-CM | POA: Insufficient documentation

## 2016-02-28 DIAGNOSIS — E1122 Type 2 diabetes mellitus with diabetic chronic kidney disease: Secondary | ICD-10-CM

## 2016-02-28 DIAGNOSIS — M1A9XX0 Chronic gout, unspecified, without tophus (tophi): Secondary | ICD-10-CM

## 2016-02-28 DIAGNOSIS — M7501 Adhesive capsulitis of right shoulder: Secondary | ICD-10-CM

## 2016-02-28 DIAGNOSIS — Z01411 Encounter for gynecological examination (general) (routine) with abnormal findings: Secondary | ICD-10-CM | POA: Insufficient documentation

## 2016-02-28 DIAGNOSIS — Z136 Encounter for screening for cardiovascular disorders: Secondary | ICD-10-CM

## 2016-02-28 DIAGNOSIS — E785 Hyperlipidemia, unspecified: Secondary | ICD-10-CM

## 2016-02-28 DIAGNOSIS — E559 Vitamin D deficiency, unspecified: Secondary | ICD-10-CM

## 2016-02-28 DIAGNOSIS — D649 Anemia, unspecified: Secondary | ICD-10-CM

## 2016-02-28 DIAGNOSIS — I10 Essential (primary) hypertension: Secondary | ICD-10-CM | POA: Diagnosis not present

## 2016-02-28 DIAGNOSIS — N182 Chronic kidney disease, stage 2 (mild): Secondary | ICD-10-CM

## 2016-02-28 DIAGNOSIS — Z0000 Encounter for general adult medical examination without abnormal findings: Secondary | ICD-10-CM | POA: Diagnosis not present

## 2016-02-28 DIAGNOSIS — Z79899 Other long term (current) drug therapy: Secondary | ICD-10-CM

## 2016-02-28 DIAGNOSIS — F3341 Major depressive disorder, recurrent, in partial remission: Secondary | ICD-10-CM

## 2016-02-28 DIAGNOSIS — R87610 Atypical squamous cells of undetermined significance on cytologic smear of cervix (ASC-US): Secondary | ICD-10-CM | POA: Diagnosis not present

## 2016-02-28 DIAGNOSIS — Z0001 Encounter for general adult medical examination with abnormal findings: Secondary | ICD-10-CM

## 2016-02-28 DIAGNOSIS — G4733 Obstructive sleep apnea (adult) (pediatric): Secondary | ICD-10-CM

## 2016-02-28 DIAGNOSIS — K635 Polyp of colon: Secondary | ICD-10-CM

## 2016-02-28 DIAGNOSIS — Z124 Encounter for screening for malignant neoplasm of cervix: Secondary | ICD-10-CM

## 2016-02-28 DIAGNOSIS — F419 Anxiety disorder, unspecified: Secondary | ICD-10-CM

## 2016-02-28 DIAGNOSIS — Z9989 Dependence on other enabling machines and devices: Secondary | ICD-10-CM

## 2016-02-28 NOTE — Progress Notes (Signed)
Complete Physical  Assessment and Plan:  Essential hypertension - continue medications, DASH diet, exercise and monitor at home. Call if greater than 130/80.  - Urinalysis, Routine w reflex microscopic (not at [facility]) - Microalbumin / creatinine urine ratio - EKG 12-Lead  CKD stage 2 due to type 2 diabetes mellitus ([place]) Discussed general issues about diabetes pathophysiology and management., Educational material distributed., Suggested low cholesterol diet., Encouraged aerobic exercise., Discussed foot care., Reminded to get yearly retinal exam.  Hyperlipidemia -continue medications, check lipids, decrease fatty foods, increase activity.   Obstructive sleep apnea on CPAP Sleep apnea- continue CPAP, weight loss advised.   Colon polyps Due this year, call GI  Vitamin D deficiency Continue supplement  Gout without tophus, unspecified cause, unspecified chronicity, unspecified site  Depression, remission remission   Anemia, unspecified anemia type Check CBC   Anxiety improved  Medication management  Encounter for general adult medical examination with abnormal findings  Stress incontinence in female + cystocele, declines referral at this time, but can refer to pelvic PT if she wants -     Urine culture  Screening for cervical cancer -     Cytology - PAP    Discussed med's effects and SE's. Screening labs and tests as requested with regular follow-up as recommended. Over 40 minutes of exam, counseling, chart review, and complex, high level critical decision making was performed this visit.   HPI  61 y.o. female  presents for a complete physical.  Her blood pressure has been controlled at home, today their BP is BP: 124/70 She does not workout. She denies chest pain, shortness of breath, dizziness.  She is not on cholesterol medication and denies myalgias. Her cholesterol is at goal. The cholesterol last visit was:   Lab Results  Component Value Date   CHOL 142  01/07/2016   HDL 36 (L) 01/07/2016   LDLCALC 58 01/07/2016   TRIG 241 (H) 01/07/2016   CHOLHDL 3.9 01/07/2016   She has been working on diet and exercise for diabetes, she is on bASA, she is on ACE/ARB, switched to xigduo, on trajenta and on glimeperide 1/2 daily and denies paresthesia of the feet, polydipsia, polyuria and visual disturbances. Last A1C in the office was:  Lab Results  Component Value Date   HGBA1C 7.8 (H) 01/07/2016  Last GFR: Lab Results  Component Value Date   [facility] >89 01/07/2016  Patient is on Vitamin D supplement.   Lab Results  Component Value Date   VD25OH 57 01/07/2016     BMI is Body mass index is 32.78 kg/m., she is working on diet and exercise. Has not been on  Phentermine, has stopped drinking soda.  Wt Readings from Last 3 Encounters:  02/28/16 188 lb (85.3 kg)  01/07/16 195 lb 9.6 oz (88.7 kg)  10/15/15 188 lb (85.3 kg)    Current Medications:  Current Outpatient Prescriptions on File Prior to Visit  Medication Sig Dispense Refill  . aspirin 81 MG tablet Take 81 mg by mouth daily.    . Calcium Carbonate-Vit D-Min (CALCIUM 1200 PO) Take by mouth daily.    . Canagliflozin-Metformin HCl ER (INVOKAMET XR) [phone] MG TB24 Take 300 mg by mouth daily. 2 pills a day 180 tablet 3  . Cholecalciferol (VITAMIN D) 2000 UNITS tablet Take 2,000 Units by mouth 2 (two) times daily.    . cyclobenzaprine (FLEXERIL) 10 MG tablet 1 po q 8 hrs prn spasm 60 tablet 1  . glimepiride (AMARYL) 4 MG tablet TAKE 1  TABLET (4 MG TOTAL) BY MOUTH DAILY WITH BREAKFAST. 90 tablet 01  . lisinopril (PRINIVIL,ZESTRIL) 10 MG tablet TAKE 1 TABLET (10 MG TOTAL) BY MOUTH DAILY. 90 tablet 1  . Multiple Vitamin (MULTIVITAMIN) capsule Take 1 capsule by mouth daily.    . Omega-3 Fatty Acids (FISH OIL) 1000 MG CAPS Take by mouth daily.    Glory Rosebush VERIO test strip TEST SUGAR 2-3 TIMES A DAY 100 each 2  . TRADJENTA 5 MG TABS tablet TAKE 1 TABLET BY MOUTH EVERY DAY 90 tablet 1   No  current facility-administered medications on file prior to visit.    Health Maintenance:   Immunization History  Administered Date(s) Administered  . PPD Test 02/22/2013, 02/23/2014  . Td 02/09/2001  . Tdap 02/23/2011  . Zoster 02/09/2006   Tetanus: 2013 Pneumovax: declines Prevnar 13: declines Flu vaccine: declines Zostavax: 2008  Pap: 2013 due 2018 MGM: 02/2016 normal at [facility] DEXA: 2010 normal Colonoscopy: 2012 due 2018  Last Eye Exam: Feb 2nd next appointment, Dr. Pricilla Riffle Patient Care Team: Unk Pinto, MD as PCP - General (Internal Medicine) Calvert Cantor, MD as Consulting Physician (Ophthalmology) Richmond Campbell, MD as Consulting Physician (Gastroenterology) Brien Few, MD as Consulting Physician (Obstetrics and Gynecology) Daryll Brod, MD as Consulting Physician (Orthopedic Surgery) Lavonna Monarch, MD as Consulting Physician (Dermatology)  Medical History:  Past Medical History:  Diagnosis Date  . Anemia   . Anxiety   . Colon polyps   . Depression, controlled   . Diabetes mellitus without complication ([place])   . Gout   . Hyperlipidemia   . Hypertension   . Obstructive sleep apnea on CPAP    uses CPAP nightly  . 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 Clinoril [Sulindac]   . Erythromycin     SURGICAL HISTORY She  has a past surgical history that includes Abdominal hysterectomy; Breast surgery (Left); Ovary surgery (Left, 1999); achilles (Right, 2009); and Eye surgery. FAMILY HISTORY Her family history includes Alcohol abuse in her father; COPD in her mother; Depression in her mother; Diabetes in her sister; Heart attack in her father; Heart disease in her father. SOCIAL HISTORY She  reports that she has never smoked. She has never used smokeless tobacco. She reports that she does not drink alcohol or use drugs.  Review of Systems: Review of Systems  Constitutional: Negative.    HENT: Negative.   Eyes: Negative.   Respiratory: Negative.   Cardiovascular: Negative.   Gastrointestinal: Negative.   Genitourinary: Positive for frequency. Negative for dysuria, flank pain, hematuria and urgency.       Stress incontinence  Musculoskeletal: Negative.   Skin: Negative.   Neurological: Negative.   Endo/Heme/Allergies: Positive for polydipsia. Negative for environmental allergies. Does not bruise/bleed easily.  Psychiatric/Behavioral: Negative.     Physical Exam: Estimated body mass index is 32.78 kg/m as calculated from the following:   Height as of this encounter: 5' 3.5" (1.613 m).   Weight as of this encounter: 188 lb (85.3 kg). BP 124/70   Pulse 86   Temp 98.2 F (36.8 C) (Temporal)   Resp 16   Ht 5' 3.5" (1.613 m)   Wt 188 lb (85.3 kg)   BMI 32.78 kg/m  General Appearance: Well nourished, in no apparent distress.  Eyes: PERRLA, EOMs, conjunctiva no swelling or erythema, normal fundi and vessels.  Sinuses: No Frontal/maxillary tenderness  ENT/Mouth: Ext aud canals clear, normal light reflex with TMs without erythema,  bulging. Good dentition. No erythema, swelling, or exudate on post pharynx. Tonsils not swollen or erythematous. Hearing normal.  Neck: Supple, thyroid normal. No bruits  Respiratory: Respiratory effort normal, BS equal bilaterally without rales, rhonchi, wheezing or stridor.  Cardio: RRR without murmurs, rubs or gallops. Brisk peripheral pulses without edema.  Chest: symmetric, with normal excursions and percussion.  Breasts: Symmetric, without lumps, nipple discharge, retractions.  Abdomen: Soft, nontender, no guarding, rebound, hernias, masses, or organomegaly.  Lymphatics: Non tender without lymphadenopathy.  Genitourinary: normal external genitalia, vulva, vagina, cervix, uterus and adnexa, VULVA: normal appearing vulva with no masses, tenderness or lesions, + cystocele, no lesions, normal cervix. Musculoskeletal: Full ROM all peripheral  extremities,5/5 strength, and normal gait.  Skin: Warm, dry without rashes, lesions, ecchymosis. Neuro: Cranial nerves intact, reflexes equal bilaterally. Normal muscle tone, no cerebellar symptoms. Sensation intact.  Psych: Awake and oriented X 3, normal affect, Insight and Judgment appropriate.   EKG: WNL no ST changes. AORTA SCAN: defer  Vicie Mutters 9:33 AM [facility]

## 2016-02-28 NOTE — Patient Instructions (Addendum)
Check on colonoscopy, due for it this year  Cologuard is an easy to use noninvasive colon cancer screening test based on the latest advances in stool DNA science.   Colon cancer is 3rd most diagnosed cancer and 2nd leading cause of death in both men and women 61 years of age and older despite being one of the most preventable and treatable cancers if found early.  4 of out 5 people diagnosed with colon cancer have NO prior family history.  When caught EARLY 90% of colon cancer is curable.   You have agreed to do a Cologuard screening and have declined a colonoscopy in spite of being explained the risks and benefits of the colonoscopy in detail, including cancer and death. Please understand that this is test not as sensitive or specific as a colonoscopy and you are still recommended to get a colonoscopy.   If you are NOT medicare please call your insurance company and give them these items to see if they will cover it: 1) CPT code, [phone] 2) Provider is Probation officer 3) [employer] NPI [id number] 4) [employer] Tax ID [id number]  Out-of-pocket cost for Cologuard can range from $0 - $649 so please call  You will receive a short call from [place] support center at [employer], when you receive a call they will say they are from [place],  to confirm your mailing address and give you more information.  When they calll you, it will appear on the caller ID as "[employer]" or in some cases only this number will appear, [phone].   [employer] will ship your collection kit directly to you. You will collect a single stool sample in the privacy of your own home, no special preparation required. You will return the kit via [place] pre-paid shipping or pick-up, in the same box it arrived in. Then I will contact you to discuss your results after I receive them from the laboratory.   If you have any questions or concerns, Cologuard  Customer Support Specialist are available 24 hours a day, 7 days a week at [phone] or go to [email or web address].      Urinary Incontinence Urinary incontinence is the involuntary loss of urine from your bladder. CAUSES  There are many causes of urinary incontinence. They include:  Medicines.  Infections.  Prostatic enlargement, leading to overflow of urine from your bladder.  Surgery.  Neurological diseases.  Emotional factors. SIGNS AND SYMPTOMS Urinary Incontinence can be divided into four types: 1. Urge incontinence. Urge incontinence is the involuntary loss of urine before you have the opportunity to go to the bathroom. There is a sudden urge to void but not enough time to reach a bathroom. 2. Stress incontinence. Stress incontinence is the sudden loss of urine with any activity that forces urine to pass. It is commonly caused by anatomical changes to the pelvis and sphincter areas of your body. 3. Overflow incontinence. Overflow incontinence is the loss of urine from an obstructed opening to your bladder. This results in a backup of urine and a resultant buildup of pressure within the bladder. When the pressure within the bladder exceeds the closing pressure of the sphincter, the urine overflows, which causes incontinence, similar to water overflowing a dam. 4. Total incontinence. Total incontinence is the loss of urine as a result of the inability to store urine within your bladder. DIAGNOSIS  Evaluating the cause of incontinence may require:  A thorough and complete medical and obstetric history.  A complete physical exam.  Laboratory tests such as a urine culture and sensitivities. When additional tests are indicated, they can include:  An ultrasound exam.  Kidney and bladder X-rays.  Cystoscopy. This is an exam of the bladder using a narrow scope.  Urodynamic testing to test the nerve function to the bladder and sphincter areas. TREATMENT  Treatment for  urinary incontinence depends on the cause:  For urge incontinence caused by a bacterial infection, antibiotics will be prescribed. If the urge incontinence is related to medicines you take, your health care provider may have you change the medicine.  For stress incontinence, surgery to re-establish anatomical support to the bladder or sphincter, or both, will often correct the condition.  For overflow incontinence caused by an enlarged prostate, an operation to open the channel through the enlarged prostate will allow the flow of urine out of the bladder. In women with fibroids, a hysterectomy may be recommended.  For total incontinence, surgery on your urinary sphincter may help. An artificial urinary sphincter (an inflatable cuff placed around the urethra) may be required. In women who have developed a hole-like passage between their bladder and vagina (vesicovaginal fistula), surgery to close the fistula often is required. HOME CARE INSTRUCTIONS  Normal daily hygiene and the use of pads or adult diapers that are changed regularly will help prevent odors and skin damage.  Avoid caffeine. It can overstimulate your bladder.  Use the bathroom regularly. Try about every 2-3 hours to go to the bathroom, even if you do not feel the need to do so. Take time to empty your bladder completely. After urinating, wait a minute. Then try to urinate again.  For causes involving nerve dysfunction, keep a log of the medicines you take and a journal of the times you go to the bathroom. SEEK MEDICAL CARE IF:  You experience worsening of pain instead of improvement in pain after your procedure.  Your incontinence becomes worse instead of better. SEE IMMEDIATE MEDICAL CARE IF:  You experience fever or shaking chills.  You are unable to pass your urine.  You have redness spreading into your groin or down into your thighs. MAKE SURE YOU:  ?Understand these instructions.   Will watch your  condition.  Will get help right away if you are not doing well or get worse. This information is not intended to replace advice given to you by your health care provider. Make sure you discuss any questions you have with your health care provider. Document Released: 03/05/2004 Document Revised: 02/16/2014 Document Reviewed: 07/05/2012 Elsevier Interactive Patient Education  2017 [place].  About Cystocele  Overview  The pelvic organs, including the bladder, are normally supported by pelvic floor muscles and ligaments.  When these muscles and ligaments are stretched, weakened or torn, the wall between the bladder and the vagina sags or herniates causing a prolapse, sometimes called a cystocele.  This condition may cause discomfort and problems with emptying the bladder.  It can be present in various stages.  Some people are not aware of the changes.  Others may notice changes at the vaginal opening or a feeling of the bladder dropping outside the body.  Causes of a Cystocele  A cystocele is usually caused by muscle straining or stretching during childbirth.  In addition, cystocele is more common after menopause, because the hormone estrogen helps keep the elastic tissues around the pelvic organs strong.  A cystocele is more likely to occur when levels of estrogen decrease.  Other causes  include: heavy lifting, chronic coughing, previous pelvic surgery and obesity.  Symptoms  A bladder that has dropped from its normal position may cause: unwanted urine leakage (stress incontinence), frequent urination or urge to urinate, incomplete emptying of the bladder (not feeling bladder relief after emptying), pain or discomfort in the vagina, pelvis, groin, lower back or lower abdomen and frequent urinary tract infections.  Mild cases may not cause any symptoms.  Treatment Options  Pelvic floor (Kegel) exercises:  Strength training the muscles in your genital area  Behavioral changes: Treating  and preventing constipation, taking time to empty your bladder properly, learning to lift properly and/or avoid heavy lifting when possible, stopping smoking, avoiding weight gain and treating a chronic cough or bronchitis.  A pessary: A vaginal support device is sometimes used to help pelvic support caused by muscle and ligament changes.  Surgery: Surgical repair may be necessary if symptoms cannot be managed with exercise, behavioral changes and a pessary.  Surgery is usually considered for severe cases.   2007, [employer]

## 2016-02-29 LAB — URINALYSIS W MICROSCOPIC + REFLEX CULTURE
Bacteria, UA: NONE SEEN [HPF]
Bilirubin Urine: NEGATIVE
CASTS: NONE SEEN [LPF]
CRYSTALS: NONE SEEN [HPF]
HGB URINE DIPSTICK: NEGATIVE
Leukocytes, UA: NEGATIVE
NITRITE: NEGATIVE
Protein, ur: NEGATIVE
Specific Gravity, Urine: 1.036 — ABNORMAL HIGH (ref 1.001–1.035)
pH: 5 (ref 5.0–8.0)

## 2016-02-29 LAB — URINALYSIS, ROUTINE W REFLEX MICROSCOPIC
BILIRUBIN URINE: NEGATIVE
HGB URINE DIPSTICK: NEGATIVE
Leukocytes, UA: NEGATIVE
NITRITE: NEGATIVE
Protein, ur: NEGATIVE
Specific Gravity, Urine: 1.036 — ABNORMAL HIGH (ref 1.001–1.035)
pH: 5 (ref 5.0–8.0)

## 2016-02-29 LAB — URINALYSIS, MICROSCOPIC ONLY
Bacteria, UA: NONE SEEN [HPF]
CASTS: NONE SEEN [LPF]
Crystals: NONE SEEN [HPF]

## 2016-03-01 LAB — URINE CULTURE: Organism ID, Bacteria: NO GROWTH

## 2016-03-01 MED ORDER — FLUCONAZOLE 150 MG PO TABS: 150.0000 mg | ORAL_TABLET | Freq: Every day | ORAL | 3 refills | Status: DC

## 2016-03-01 MED ORDER — PHENTERMINE HCL 37.5 MG PO TABS: 37.5000 mg | ORAL_TABLET | Freq: Every day | ORAL | 2 refills | Status: DC

## 2016-03-04 LAB — CYTOLOGY - PAP
ADEQUACY: ABSENT
DIAGNOSIS: NEGATIVE
HPV (WINDOPATH): NOT DETECTED

## 2016-03-15 DIAGNOSIS — G4733 Obstructive sleep apnea (adult) (pediatric): Secondary | ICD-10-CM | POA: Diagnosis not present

## 2016-03-20 DIAGNOSIS — E119 Type 2 diabetes mellitus without complications: Secondary | ICD-10-CM | POA: Diagnosis not present

## 2016-03-20 DIAGNOSIS — D3131 Benign neoplasm of right choroid: Secondary | ICD-10-CM | POA: Diagnosis not present

## 2016-03-20 DIAGNOSIS — H40023 Open angle with borderline findings, high risk, bilateral: Secondary | ICD-10-CM | POA: Diagnosis not present

## 2016-03-20 DIAGNOSIS — H2513 Age-related nuclear cataract, bilateral: Secondary | ICD-10-CM | POA: Diagnosis not present

## 2016-04-12 DIAGNOSIS — G4733 Obstructive sleep apnea (adult) (pediatric): Secondary | ICD-10-CM | POA: Diagnosis not present

## 2016-04-23 ENCOUNTER — Encounter: Payer: Self-pay | Admitting: Physician Assistant

## 2016-04-23 ENCOUNTER — Other Ambulatory Visit: Payer: Self-pay | Admitting: Internal Medicine

## 2016-04-23 DIAGNOSIS — R300 Dysuria: Secondary | ICD-10-CM

## 2016-04-24 ENCOUNTER — Encounter: Payer: Self-pay | Admitting: Internal Medicine

## 2016-04-24 ENCOUNTER — Ambulatory Visit (INDEPENDENT_AMBULATORY_CARE_PROVIDER_SITE_OTHER): Payer: 59 | Admitting: Internal Medicine

## 2016-04-24 VITALS — BP 126/66 | HR 92 | Temp 98.0°F | Resp 16 | Ht 63.0 in | Wt 190.0 lb

## 2016-04-24 DIAGNOSIS — R300 Dysuria: Secondary | ICD-10-CM | POA: Diagnosis not present

## 2016-04-24 MED ORDER — FLUCONAZOLE 150 MG PO TABS: 150.0000 mg | ORAL_TABLET | Freq: Once | ORAL | 3 refills | Status: AC

## 2016-04-24 MED ORDER — CIPROFLOXACIN HCL 500 MG PO TABS: 500.0000 mg | ORAL_TABLET | Freq: Two times a day (BID) | ORAL | 0 refills | Status: AC

## 2016-04-24 NOTE — Progress Notes (Signed)
Assessment and Plan:   1. Dysuria -given SGLT2 use will also give diflucan as this could be possible yeast infection in the urine.  Will send home with cipro.  Will adjust if necessary depending on culture - Urinalysis, Routine w reflex microscopic - Urine culture     HPI 61 y.o.female presents for 1 week of increasing UTI symptoms.  She reports that she is having pain when she urinates for the last week.  She reports that she is having frequency, urgency, and suprapubic pressure.  No fevers, chills, NV, or flank pain.  She has been drinking more water.  She is not taking any medications.    Past Medical History:  Diagnosis Date  . Anemia   . Anxiety   . Colon polyps   . Depression, controlled   . Diabetes mellitus without complication ([place])   . Gout   . Hyperlipidemia   . Hypertension   . Obstructive sleep apnea on CPAP    uses CPAP nightly  . 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 Clinoril [Sulindac]   . Erythromycin       Current Outpatient Prescriptions on File Prior to Visit  Medication Sig Dispense Refill  . aspirin 81 MG tablet Take 81 mg by mouth daily.    . Calcium Carbonate-Vit D-Min (CALCIUM 1200 PO) Take by mouth daily.    . Canagliflozin-Metformin HCl ER (INVOKAMET XR) [phone] MG TB24 Take 300 mg by mouth daily. 2 pills a day 180 tablet 3  . Cholecalciferol (VITAMIN D) 2000 UNITS tablet Take 2,000 Units by mouth 2 (two) times daily.    . cyclobenzaprine (FLEXERIL) 10 MG tablet 1 po q 8 hrs prn spasm 60 tablet 1  . fluconazole (DIFLUCAN) 150 MG tablet Take 1 tablet (150 mg total) by mouth daily. 1 tablet 3  . glimepiride (AMARYL) 4 MG tablet TAKE 1 TABLET (4 MG TOTAL) BY MOUTH DAILY WITH BREAKFAST. 90 tablet 01  . lisinopril (PRINIVIL,ZESTRIL) 10 MG tablet TAKE 1 TABLET (10 MG TOTAL) BY MOUTH DAILY. 90 tablet 1  . meloxicam (MOBIC) 7.5 MG tablet TAKE 1 TABLET EVERY DAY WITH FOOD FOR PAIN AND SWELLING  3   . Multiple Vitamin (MULTIVITAMIN) capsule Take 1 capsule by mouth daily.    . Omega-3 Fatty Acids (FISH OIL) 1000 MG CAPS Take by mouth daily.    Glory Rosebush VERIO test strip TEST SUGAR 2-3 TIMES A DAY 100 each 2  . phentermine (ADIPEX-P) 37.5 MG tablet Take 1 tablet (37.5 mg total) by mouth daily before breakfast. 30 tablet 2  . TRADJENTA 5 MG TABS tablet TAKE 1 TABLET BY MOUTH EVERY DAY 90 tablet 1   No current facility-administered medications on file prior to visit.     Review of Systems  Constitutional: Negative for chills, fever and malaise/fatigue.  Gastrointestinal: Negative for blood in stool, constipation, diarrhea, melena, nausea and vomiting.  Genitourinary: Positive for dysuria, frequency and urgency. Negative for flank pain and hematuria.     Physical Exam: Filed Weights   04/24/16 1059  Weight: 190 lb (86.2 kg)   BP 126/66   Pulse 92   Temp 98 F (36.7 C) (Temporal)   Resp 16   Ht 5\' 3"  (1.6 m)   Wt 190 lb (86.2 kg)   BMI 33.66 kg/m  General Appearance: Well developed well nourished, non-toxic appearing in no apparent distress. Eyes: PERRLA, EOMs, conjunctiva w/ no swelling or erythema or discharge Sinuses:  No Frontal/maxillary tenderness ENT/Mouth: Ear canals clear without swelling or erythema.  TM's normal bilaterally with no retractions, bulging, or loss of landmarks.   Neck: Supple, thyroid normal, no notable JVD  Respiratory: Respiratory effort normal, Clear breath sounds anteriorly and posteriorly bilaterally without rales, rhonchi, wheezing or stridor. No retractions or accessory muscle usage. Cardio: RRR with no MRGs.   Abdomen: Soft, + BS.  Mild surapubic tenderness to palpation.  NO CVA tenderness to palpation, no guarding, rebound, hernias, masses.  Musculoskeletal: Full ROM, 5/5 strength, normal gait.  Skin: Warm, dry without rashes  Neuro: Awake and oriented X 3, Cranial nerves intact. Normal muscle tone, no cerebellar symptoms. Sensation intact.   Psych: normal affect, Insight and Judgment appropriate.     Starlyn Skeans, PA-C 11:10 AM [facility]

## 2016-04-25 LAB — URINALYSIS, MICROSCOPIC ONLY
CASTS: NONE SEEN [LPF]
Crystals: NONE SEEN [HPF]
WBC, UA: >60 WBC/HPF — AB (ref <=5)

## 2016-04-25 LAB — URINALYSIS, ROUTINE W REFLEX MICROSCOPIC
BILIRUBIN URINE: NEGATIVE
KETONES UR: NEGATIVE
Nitrite: NEGATIVE
PH: 5.5 (ref 5.0–8.0)
PROTEIN: NEGATIVE
Specific Gravity, Urine: 1.038 — ABNORMAL HIGH (ref 1.001–1.035)

## 2016-04-26 LAB — URINE CULTURE

## 2016-05-11 ENCOUNTER — Encounter: Payer: Self-pay | Admitting: *Deleted

## 2016-05-13 DIAGNOSIS — G4733 Obstructive sleep apnea (adult) (pediatric): Secondary | ICD-10-CM | POA: Diagnosis not present

## 2016-05-20 ENCOUNTER — Encounter: Payer: Self-pay | Admitting: Physician Assistant

## 2016-05-20 ENCOUNTER — Other Ambulatory Visit: Payer: 59

## 2016-05-20 DIAGNOSIS — R300 Dysuria: Secondary | ICD-10-CM

## 2016-05-21 ENCOUNTER — Other Ambulatory Visit: Payer: Self-pay | Admitting: Physician Assistant

## 2016-05-21 LAB — URINALYSIS, ROUTINE W REFLEX MICROSCOPIC
BILIRUBIN URINE: NEGATIVE
HGB URINE DIPSTICK: NEGATIVE
Leukocytes, UA: NEGATIVE
Nitrite: NEGATIVE
PH: 6.5 (ref 5.0–8.0)
PROTEIN: NEGATIVE
SPECIFIC GRAVITY, URINE: 1.029 (ref 1.001–1.035)

## 2016-05-21 LAB — URINALYSIS, MICROSCOPIC ONLY
BACTERIA UA: NONE SEEN [HPF]
CASTS: NONE SEEN [LPF]
CRYSTALS: NONE SEEN [HPF]
RBC / HPF: NONE SEEN RBC/HPF (ref <=2)
YEAST: NONE SEEN [HPF]

## 2016-05-21 MED ORDER — CIPROFLOXACIN HCL 500 MG PO TABS: 500.0000 mg | ORAL_TABLET | Freq: Two times a day (BID) | ORAL | 0 refills | Status: DC

## 2016-05-22 LAB — URINE CULTURE

## 2016-05-23 ENCOUNTER — Other Ambulatory Visit: Payer: Self-pay | Admitting: Physician Assistant

## 2016-05-23 DIAGNOSIS — R350 Frequency of micturition: Secondary | ICD-10-CM

## 2016-06-05 ENCOUNTER — Ambulatory Visit: Payer: Self-pay | Admitting: Physician Assistant

## 2016-06-12 DIAGNOSIS — G4733 Obstructive sleep apnea (adult) (pediatric): Secondary | ICD-10-CM | POA: Diagnosis not present

## 2016-06-19 NOTE — Progress Notes (Signed)
Assessment and Plan:   Essential hypertension - continue medications, DASH diet, exercise and monitor at home. Call if greater than 130/80.  -     BASIC METABOLIC PANEL WITH GFR -     CBC with Differential/Platelet -     Hepatic function panel -     TSH  CKD stage 2 due to type 2 diabetes mellitus ([place]) Will start on once a week injectable, will call insurance first.  Discussed general issues about diabetes pathophysiology and management., Educational material distributed., Suggested low cholesterol diet., Encouraged aerobic exercise., Discussed foot care., Reminded to get yearly retinal exam. - may start once a week injectable, will call her insurance -     BASIC METABOLIC PANEL WITH GFR -     Hemoglobin A1c  Hyperlipidemia, unspecified hyperlipidemia type -continue medications, check lipids, decrease fatty foods, increase activity.  -     Lipid panel  Recurrent major depressive disorder, in partial remission ([place]) - continue medications, stress management techniques discussed, increase water, good sleep hygiene discussed, increase exercise, and increase veggies.   Chronic gout without tophus, unspecified cause, unspecified site Gout- recheck Uric acid as needed, Diet discussed, continue medications.  Vitamin D deficiency Continue supplement  Medication management -     Magnesium  Morbid obesity ([place]) - long discussion about weight loss, diet, and exercise -     Lipid panel -     Hemoglobin A1c  Vertigo Normal neuro, non smoker, continue bASA Likely BPV, treat ears, if worsening symptoms go to ER    Continue diet and meds as discussed. Further disposition pending results of labs. Discussed med's effects and SE's.   HPI 61 y.o. female  presents for 3 month follow up with hypertension, hyperlipidemia, diabetes and vitamin D. Her blood pressure has been controlled at home, today their BP is BP: 120/80 She does not workout. She denies chest pain, shortness of breath,  dizziness.  She has a history of vertigo several years ago x 2 years ago, worse with lying in bed, worse with leaning over, able to do treadmill without any vertigo/dizziness. Normal MRI 2014, has sleep apnea, on CPAP. No sinus issues, fever, chills, ear issues, HA, no trouble finding worse, weakness/tremor.  She is not on cholesterol medication and denies myalgias. Her cholesterol is at goal. The cholesterol was:  01/07/2016: Cholesterol 142; HDL 36; LDL Cholesterol 58; Triglycerides 241 She has been working on diet and exercise for Diabetes,  she has been checking her sugars at home running 200;s fasting, she , and she is off glimeperide,  invokamet, and trajenta. she is on ASA and she is on ACE/ARB, and having polydipsia, polyuria and denies paresthesia of the feet and visual disturbances. Last A1C  was: 01/07/2016: Hgb A1c MFr Bld 7.8  Lab Results  Component Value Date   GFRNONAA >89 01/07/2016   Has had her hair falling out since jan/Feb 2016, currently in a wig, states that her mother had her hair thinning as well.  Patient is on Vitamin D supplement. She was on allopurinol for gout but last time she was not on it and last uric acid was   Lab Results  Component Value Date   LABURIC 6.8 01/07/2016   01/07/2016: Vit D, 25-Hydroxy 57  BMI is Body mass index is 33.41 kg/m., she is working on diet and exercise and has done well. She is not on phentermine at this time.   Wt Readings from Last 3 Encounters:  06/23/16 188 lb 9.6 oz (85.5  kg)  04/24/16 190 lb (86.2 kg)  02/28/16 188 lb (85.3 kg)    Current Medications:  Current Outpatient Prescriptions on File Prior to Visit  Medication Sig Dispense Refill  . aspirin 81 MG tablet Take 81 mg by mouth daily.    . Calcium Carbonate-Vit D-Min (CALCIUM 1200 PO) Take by mouth daily.    . Canagliflozin-Metformin HCl ER (INVOKAMET XR) [id number] MG TB24 Take 300 mg by mouth daily. 2 pills a day 180 tablet 3  . Cholecalciferol (VITAMIN D) 2000 UNITS  tablet Take 2,000 Units by mouth 2 (two) times daily.    . cyclobenzaprine (FLEXERIL) 10 MG tablet 1 po q 8 hrs prn spasm 60 tablet 1  . fluconazole (DIFLUCAN) 150 MG tablet Take 1 tablet (150 mg total) by mouth daily. 1 tablet 3  . glimepiride (AMARYL) 4 MG tablet TAKE 1 TABLET (4 MG TOTAL) BY MOUTH DAILY WITH BREAKFAST. 90 tablet 01  . lisinopril (PRINIVIL,ZESTRIL) 10 MG tablet TAKE 1 TABLET (10 MG TOTAL) BY MOUTH DAILY. 90 tablet 1  . meloxicam (MOBIC) 7.5 MG tablet TAKE 1 TABLET EVERY DAY WITH FOOD FOR PAIN AND SWELLING  3  . Multiple Vitamin (MULTIVITAMIN) capsule Take 1 capsule by mouth daily.    . Omega-3 Fatty Acids (FISH OIL) 1000 MG CAPS Take by mouth daily.    Glory Rosebush VERIO test strip TEST SUGAR 2-3 TIMES A DAY 100 each 2  . phentermine (ADIPEX-P) 37.5 MG tablet Take 1 tablet (37.5 mg total) by mouth daily before breakfast. 30 tablet 2  . TRADJENTA 5 MG TABS tablet TAKE 1 TABLET BY MOUTH EVERY DAY 90 tablet 1   No current facility-administered medications on file prior to visit.    Medical History:  Past Medical History:  Diagnosis Date  . Anemia   . Anxiety   . Colon polyps   . Depression, controlled   . Diabetes mellitus without complication ([place])   . Gout   . Hyperlipidemia   . Hypertension   . Obstructive sleep apnea on CPAP    uses CPAP nightly  . 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 Clinoril [Sulindac]   . Erythromycin     Review of Systems  Constitutional: Negative.   HENT: Negative.   Eyes: Negative.   Respiratory: Negative.   Cardiovascular: Negative.   Gastrointestinal: Negative.   Genitourinary: Negative for dysuria, flank pain, frequency, hematuria and urgency.       Stress incontinence  Musculoskeletal: Negative.   Skin: Negative.  Negative for itching.       Hair loss  Neurological: Positive for dizziness. Negative for tingling, tremors, sensory change, speech change, focal  weakness, seizures, loss of consciousness and headaches.  Endo/Heme/Allergies: Negative for environmental allergies and polydipsia. Does not bruise/bleed easily.  Psychiatric/Behavioral: Negative.      Family history- Review and unchanged Social history- Review and unchanged Physical Exam: BP 120/80   Pulse 67   Temp 97.4 F (36.3 C)   Resp 16   Ht 5\' 3"  (1.6 m)   Wt 188 lb 9.6 oz (85.5 kg)   SpO2 97%   BMI 33.41 kg/m  Wt Readings from Last 3 Encounters:  06/23/16 188 lb 9.6 oz (85.5 kg)  04/24/16 190 lb (86.2 kg)  02/28/16 188 lb (85.3 kg)   General Appearance: Well nourished, in no apparent distress. Eyes: PERRLA, EOMs, conjunctiva no swelling or erythema Sinuses: No Frontal/maxillary tenderness ENT/Mouth: Ext aud canals clear,  TMs without erythema, bulging. No erythema, swelling, or exudate on post pharynx.  Tonsils not swollen or erythematous. Hearing normal.  Neck: Supple, thyroid normal.  Respiratory: Respiratory effort normal, BS equal bilaterally without rales, rhonchi, wheezing or stridor.  Cardio: RRR with no MRGs. Brisk peripheral pulses without edema.  Abdomen: Soft, + BS.  Non tender, no guarding, rebound, hernias, masses. Lymphatics: Non tender without lymphadenopathy.  Musculoskeletal: Full ROM, 5/5 strength, normal gait.  Skin: Crown hair thinning with mild follicular erythema. Warm, dry without rashes, lesions, ecchymosis.  Neuro: Cranial nerves intact. No cerebellar symptoms. Sensation intact.  Psych: Awake and oriented X 3, normal affect, Insight and Judgment appropriate.    Vicie Mutters, PA-C 9:27 AM [facility]

## 2016-06-23 ENCOUNTER — Ambulatory Visit (INDEPENDENT_AMBULATORY_CARE_PROVIDER_SITE_OTHER): Payer: 59 | Admitting: Physician Assistant

## 2016-06-23 ENCOUNTER — Encounter: Payer: Self-pay | Admitting: Physician Assistant

## 2016-06-23 VITALS — BP 120/80 | HR 67 | Temp 97.4°F | Resp 16 | Ht 63.0 in | Wt 188.6 lb

## 2016-06-23 DIAGNOSIS — E785 Hyperlipidemia, unspecified: Secondary | ICD-10-CM | POA: Diagnosis not present

## 2016-06-23 DIAGNOSIS — N182 Chronic kidney disease, stage 2 (mild): Secondary | ICD-10-CM | POA: Diagnosis not present

## 2016-06-23 DIAGNOSIS — Z79899 Other long term (current) drug therapy: Secondary | ICD-10-CM | POA: Diagnosis not present

## 2016-06-23 DIAGNOSIS — R42 Dizziness and giddiness: Secondary | ICD-10-CM

## 2016-06-23 DIAGNOSIS — M1A9XX0 Chronic gout, unspecified, without tophus (tophi): Secondary | ICD-10-CM

## 2016-06-23 DIAGNOSIS — F3341 Major depressive disorder, recurrent, in partial remission: Secondary | ICD-10-CM | POA: Diagnosis not present

## 2016-06-23 DIAGNOSIS — E559 Vitamin D deficiency, unspecified: Secondary | ICD-10-CM

## 2016-06-23 DIAGNOSIS — E1122 Type 2 diabetes mellitus with diabetic chronic kidney disease: Secondary | ICD-10-CM | POA: Diagnosis not present

## 2016-06-23 DIAGNOSIS — I10 Essential (primary) hypertension: Secondary | ICD-10-CM

## 2016-06-23 LAB — CBC WITH DIFFERENTIAL/PLATELET
Basophils Absolute: 53 cells/uL (ref 0–200)
Basophils Relative: 1 %
EOS PCT: 6 %
Eosinophils Absolute: 318 cells/uL (ref 15–500)
HCT: 44.9 % (ref 35.0–45.0)
HEMOGLOBIN: 14.8 g/dL (ref 11.7–15.5)
Lymphocytes Relative: 34 %
Lymphs Abs: 1802 cells/uL (ref 850–3900)
MCH: 31.2 pg (ref 27.0–33.0)
MCHC: 33 g/dL (ref 32.0–36.0)
MCV: 94.7 fL (ref 80.0–100.0)
MONO ABS: 318 {cells}/uL (ref 200–950)
MPV: 10.1 fL (ref 7.5–12.5)
Monocytes Relative: 6 %
NEUTROS ABS: 2809 {cells}/uL (ref 1500–7800)
Neutrophils Relative %: 53 %
Platelets: 173 10*3/uL (ref 140–400)
RBC: 4.74 MIL/uL (ref 3.80–5.10)
RDW: 13.9 % (ref 11.0–15.0)
WBC: 5.3 10*3/uL (ref 3.8–10.8)

## 2016-06-23 LAB — BASIC METABOLIC PANEL WITH GFR
BUN: 12 mg/dL (ref 7–25)
CO2: 26 mmol/L (ref 20–31)
CREATININE: 0.8 mg/dL (ref 0.50–0.99)
Calcium: 10.1 mg/dL (ref 8.6–10.4)
Chloride: 103 mmol/L (ref 98–110)
GFR, Est African American: >89 mL/min (ref >=60)
GFR, Est Non African American: 80 mL/min (ref >=60)
GLUCOSE: 181 mg/dL — AB (ref 65–99)
Potassium: 4.6 mmol/L (ref 3.5–5.3)
Sodium: 141 mmol/L (ref 135–146)

## 2016-06-23 LAB — MAGNESIUM: Magnesium: 1.8 mg/dL (ref 1.5–2.5)

## 2016-06-23 LAB — HEPATIC FUNCTION PANEL
ALK PHOS: 70 U/L (ref 33–130)
ALT: 40 U/L — ABNORMAL HIGH (ref 6–29)
AST: 26 U/L (ref 10–35)
Albumin: 4.3 g/dL (ref 3.6–5.1)
BILIRUBIN DIRECT: 0.1 mg/dL (ref <=0.2)
BILIRUBIN TOTAL: 0.5 mg/dL (ref 0.2–1.2)
Indirect Bilirubin: 0.4 mg/dL (ref 0.2–1.2)
Total Protein: 6.7 g/dL (ref 6.1–8.1)

## 2016-06-23 LAB — LIPID PANEL
CHOL/HDL RATIO: 4.1 ratio (ref <5.0)
Cholesterol: 145 mg/dL (ref <200)
HDL: 35 mg/dL — ABNORMAL LOW (ref >50)
LDL CALC: 62 mg/dL (ref <100)
Triglycerides: 239 mg/dL — ABNORMAL HIGH (ref <150)
VLDL: 48 mg/dL — AB (ref <30)

## 2016-06-23 LAB — TSH: TSH: 0.6 mIU/L

## 2016-06-23 NOTE — Patient Instructions (Signed)
Your ears and sinuses are connected by the eustachian tube. When your sinuses are inflamed, this can close off the tube and cause fluid to collect in your middle ear. This can then cause dizziness, popping, clicking, ringing, and echoing in your ears. This is often NOT an infection and does NOT require antibiotics, it is caused by inflammation so the treatments help the inflammation. This can take a long time to get better so please be patient.  Here are things you can do to help with this: - Try the Flonase or Nasonex. Remember to spray each nostril twice towards the outer part of your eye.  Do not sniff but instead pinch your nose and tilt your head back to help the medicine get into your sinuses.  The best time to do this is at bedtime.Stop if you get blurred vision or nose bleeds.  -While drinking fluids, pinch and hold nose close and swallow, to help open eustachian tubes to drain fluid behind ear drums. -Please pick one of the over the counter allergy medications below and take it once daily for allergies.  It will also help with fluid behind ear drums. Claritin or loratadine cheapest but likely the weakest  Zyrtec or certizine at night because it can make you sleepy The strongest is allegra or fexafinadine  Cheapest at [employer], [employer], [employer] -can use decongestant over the counter, please do not use if you have high blood pressure or certain heart conditions.   if worsening HA, changes vision/speech, imbalance, weakness go to the ER    Benign Paroxysmal Positional Vertigo (BPPV)  General Information In Benign Paroxysmal Positional Vertigo (BPPV) dizziness is generally thought to be due to debris which has collected within a part of the inner ear. This debris can be thought of as "ear rocks", although the formal name is "otoconia". Ear rocks are small crystals of calcium carbonate derived from a structure in the ear called the "utricle" (figure to the right ). The symptoms of BPPV include  dizziness or vertigo, lightheadedness, imbalance, and nausea. Activities which bring on symptoms will vary among persons, but symptoms are usually followed by a change of position of the head like getting out of bed or rolling over in bed are common "problem" motions .  However if you have worsening HA, changes vision/speech, weakness go to the ER     What can be done? 1) Use two or more pillows at night. Avoid sleeping on the "bad" side. In the morning, get up slowly and sit on the edge of the bed for a minute. 2) Medication prescribed by your doctor.  3) The exercises below, you can do at home to help you prevent the sensation later in the day.  4) We can refer you to physical therapy at [facility], # [phone]  Home treatments: The Brandt-Daroff Exercises are a home method of treating BPPV,and are effective 95% of the time.  These exercises are performed in three sets per day for two weeks. In each set, one performs the maneuver as shown five times. Start sitting upright (position 1). Then move into the side-lying position (position 2), with the head angled upward about halfway. An easy way to remember this is to imagine someone standing about 6 feet in front of you, and just keep looking at their head at all times. Stay in the side-lying position for 30 seconds, or until the dizziness subsides if this is longer, then go back to the sitting position (position 3).   Stay there for 30 seconds, and then go to the opposite side (position 4) and follow the same routine.  At home Epley Maneuver This procedure seems to be even more effective than the in-office procedure, perhaps because it is repeated every night for a week.  The method (for the left side) is performed as shown on the figure below.  1) One stays in each of the supine (lying down) positions for 30 seconds, and in the sitting upright position (top) for 1 minute.  2) Thus, once cycle takes 2 1/2 minutes.   3) Typically 3 cycles are performed just prior to going to sleep.  4) It is best to do them at night rather than in the morning or midday, as if one becomes dizzy following the exercises, then it can resolve while one is sleeping.  The mirror image of this procedure is used for the right ear.  

## 2016-06-24 LAB — HEMOGLOBIN A1C
Hgb A1c MFr Bld: 8 % — ABNORMAL HIGH (ref <5.7)
Mean Plasma Glucose: 183 mg/dL

## 2016-07-13 DIAGNOSIS — G4733 Obstructive sleep apnea (adult) (pediatric): Secondary | ICD-10-CM | POA: Diagnosis not present

## 2016-07-16 ENCOUNTER — Other Ambulatory Visit: Payer: Self-pay | Admitting: Physician Assistant

## 2016-07-16 ENCOUNTER — Other Ambulatory Visit: Payer: Self-pay | Admitting: Internal Medicine

## 2016-07-27 ENCOUNTER — Other Ambulatory Visit: Payer: Self-pay | Admitting: Physician Assistant

## 2016-07-27 ENCOUNTER — Encounter: Payer: Self-pay | Admitting: Physician Assistant

## 2016-07-27 MED ORDER — DULAGLUTIDE 1.5 MG/0.5ML ~~LOC~~ SOAJ: SUBCUTANEOUS | 1 refills | Status: DC

## 2016-07-27 NOTE — Progress Notes (Signed)
Sent in med

## 2016-08-12 DIAGNOSIS — G4733 Obstructive sleep apnea (adult) (pediatric): Secondary | ICD-10-CM | POA: Diagnosis not present

## 2016-08-13 ENCOUNTER — Encounter: Payer: Self-pay | Admitting: Physician Assistant

## 2016-08-14 ENCOUNTER — Other Ambulatory Visit: Payer: Self-pay | Admitting: Physician Assistant

## 2016-08-14 MED ORDER — PHENTERMINE HCL 37.5 MG PO TABS: 37.5000 mg | ORAL_TABLET | Freq: Every day | ORAL | 2 refills | Status: DC

## 2016-08-14 NOTE — Progress Notes (Signed)
Phentermine was called into pharmacy on 6th July 2018 @ 9am by DD

## 2016-08-21 DIAGNOSIS — G4733 Obstructive sleep apnea (adult) (pediatric): Secondary | ICD-10-CM | POA: Diagnosis not present

## 2016-09-12 DIAGNOSIS — G4733 Obstructive sleep apnea (adult) (pediatric): Secondary | ICD-10-CM | POA: Diagnosis not present

## 2016-09-22 LAB — HM DIABETES EYE EXAM

## 2016-09-26 ENCOUNTER — Other Ambulatory Visit: Payer: Self-pay | Admitting: Internal Medicine

## 2016-09-26 DIAGNOSIS — E119 Type 2 diabetes mellitus without complications: Secondary | ICD-10-CM

## 2016-10-02 DIAGNOSIS — E663 Overweight: Secondary | ICD-10-CM | POA: Insufficient documentation

## 2016-10-02 DIAGNOSIS — E669 Obesity, unspecified: Secondary | ICD-10-CM | POA: Insufficient documentation

## 2016-10-02 NOTE — Progress Notes (Signed)
Assessment and Plan:   Essential hypertension - continue medications, DASH diet, exercise and monitor at home. Call if greater than 130/80.  -     BASIC METABOLIC PANEL WITH GFR -     CBC with Differential/Platelet -     Hepatic function panel -     TSH  CKD stage 2 due to type 2 diabetes mellitus ([place]) Discussed general issues about diabetes pathophysiology and management., Educational material distributed., Suggested low cholesterol diet., Encouraged aerobic exercise., Discussed foot care., Reminded to get yearly retinal exam. - may start once a week injectable, will call her insurance -     BASIC METABOLIC PANEL WITH GFR -     Hemoglobin A1c  Hyperlipidemia, unspecified hyperlipidemia type -continue medications, check lipids, decrease fatty foods, increase activity.  -     Lipid panel  Recurrent major depressive disorder, in partial remission ([place]) - continue medications, stress management techniques discussed, increase water, good sleep hygiene discussed, increase exercise, and increase veggies.   Chronic gout without tophus, unspecified cause, unspecified site Gout- recheck Uric acid as needed, Diet discussed, continue medications.  Vitamin D deficiency Continue supplement  Medication management -     Magnesium  Morbid obesity ([place]) - long discussion about weight loss, diet, and exercise -     Lipid panel -     Hemoglobin A1c  Chest pain, unspecified type Able to hike without issues Pain worse with lying down, sharp pain left shoulder to her back No SOB, no diaphoresis, no dizziness High risk CAD with DM and family history Urgent referral to cardio Check labs, ? pericarditis Go to the ER if any CP, SOB, nausea, dizziness, severe HA, changes vision/speech -     EKG 12-Lead -     Ambulatory referral to Cardiology -     Uric acid -     ANA -     C-reactive protein -     Troponin I -     CK total and CKMB (cardiac)not at [place] and meds as  discussed. Further disposition pending results of labs. Discussed med's effects and SE's.   HPI 61 y.o. female  presents for 3 month follow up with hypertension, hyperlipidemia, diabetes and vitamin D. Her blood pressure has been controlled at home, today their BP is BP: 128/74 She does not workout. She denies chest pain, shortness of breath, dizziness.  She is not on cholesterol medication and denies myalgias. Her cholesterol is at goal. The cholesterol was:  06/23/2016: Cholesterol 145; HDL 35; LDL Cholesterol 62; Triglycerides 239  Will have through and through chest pain to her back, very localized with some pain along right breast, sharp, worse with lying down flat. Some time will having pain in her arm. W She has been working on diet and exercise for Diabetes,  she has been checking her sugars at home running 117, highest is 199, she , and she is on glimeperide once a day 1/2-1,  invokamet, and trajenta. she is on ASA and she is on ACE/ARB, and having polydipsia, polyuria and denies paresthesia of the feet and visual disturbances. Last A1C  was: 06/23/2016: Hgb A1c MFr Bld 8.0  Lab Results  Component Value Date   GFRNONAA 80 06/23/2016   Has had her hair falling out since jan/Feb 2016, currently in a wig. Patient is on Vitamin D supplement. She was on allopurinol for gout but last time she was not on it and last uric acid was  Lab Results  Component Value Date   LABURIC 6.8 01/07/2016   01/07/2016: Vit D, 25-Hydroxy 57  BMI is Body mass index is 33.13 kg/m., she is working on diet and exercise and has done well. She is not on phentermine at this time.   Wt Readings from Last 3 Encounters:  10/05/16 187 lb (84.8 kg)  06/23/16 188 lb 9.6 oz (85.5 kg)  04/24/16 190 lb (86.2 kg)    Current Medications:  Current Outpatient Prescriptions on File Prior to Visit  Medication Sig Dispense Refill  . aspirin 81 MG tablet Take 81 mg by mouth daily.    . Calcium Carbonate-Vit D-Min (CALCIUM  1200 PO) Take by mouth daily.    . Cholecalciferol (VITAMIN D) 2000 UNITS tablet Take 2,000 Units by mouth 2 (two) times daily.    . cyclobenzaprine (FLEXERIL) 10 MG tablet 1 po q 8 hrs prn spasm 60 tablet 1  . fluconazole (DIFLUCAN) 150 MG tablet Take 1 tablet (150 mg total) by mouth daily. 1 tablet 3  . glimepiride (AMARYL) 4 MG tablet TAKE 1 TABLET (4 MG TOTAL) BY MOUTH DAILY WITH BREAKFAST. 90 tablet 1  . INVOKAMET XR [phone] MG TB24 TAKE 2 TABLETS BY MOUTH EVERY DAY 180 tablet 2  . lisinopril (PRINIVIL,ZESTRIL) 10 MG tablet TAKE 1 TABLET EVERY DAY 90 tablet 1  . meloxicam (MOBIC) 7.5 MG tablet TAKE 1 TABLET EVERY DAY WITH FOOD FOR PAIN AND SWELLING  3  . Multiple Vitamin (MULTIVITAMIN) capsule Take 1 capsule by mouth daily.    . Omega-3 Fatty Acids (FISH OIL) 1000 MG CAPS Take by mouth daily.    Glory Rosebush VERIO test strip TEST SUGAR 2-3 TIMES A DAY 100 each 2  . phentermine (ADIPEX-P) 37.5 MG tablet Take 1 tablet (37.5 mg total) by mouth daily before breakfast. 30 tablet 2  . TRADJENTA 5 MG TABS tablet TAKE 1 TABLET BY MOUTH EVERY DAY 90 tablet 1   No current facility-administered medications on file prior to visit.    Medical History:  Past Medical History:  Diagnosis Date  . Anemia   . Anxiety   . Colon polyps   . Depression, controlled   . Diabetes mellitus without complication ([place])   . Gout   . Hyperlipidemia   . Hypertension   . Obstructive sleep apnea on CPAP    uses CPAP nightly  . 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 Clinoril [Sulindac]   . Erythromycin     Review of Systems  Constitutional: Negative.   HENT: Negative.   Eyes: Negative.   Respiratory: Negative.   Cardiovascular: Positive for chest pain. Negative for palpitations, orthopnea, claudication, leg swelling and PND.  Gastrointestinal: Negative.   Genitourinary: Negative for dysuria, flank pain, frequency, hematuria and urgency.        Stress incontinence  Musculoskeletal: Negative.   Skin: Negative.  Negative for itching.       Hair loss  Neurological: Negative for dizziness, tingling, tremors, sensory change, speech change, focal weakness, seizures, loss of consciousness and headaches.  Endo/Heme/Allergies: Negative for environmental allergies and polydipsia. Does not bruise/bleed easily.  Psychiatric/Behavioral: Negative.      Family history- Review and unchanged Social history- Review and unchanged Physical Exam: BP 128/74   Pulse 84   Ht 5\' 3"  (1.6 m)   Wt 187 lb (84.8 kg)   SpO2 94%   BMI 33.13 kg/m  Wt Readings from Last 3 Encounters:  10/05/16 187 lb (84.8 kg)  06/23/16 188 lb 9.6 oz (85.5 kg)  04/24/16 190 lb (86.2 kg)   General Appearance: Well nourished, in no apparent distress. Eyes: PERRLA, EOMs, conjunctiva no swelling or erythema Sinuses: No Frontal/maxillary tenderness ENT/Mouth: Ext aud canals clear, TMs without erythema, bulging. No erythema, swelling, or exudate on post pharynx.  Tonsils not swollen or erythematous. Hearing normal.  Neck: Supple, thyroid normal.  Respiratory: Respiratory effort normal, BS equal bilaterally without rales, rhonchi, wheezing or stridor.  Cardio: RRR with no MRGs. Brisk peripheral pulses without edema.  Abdomen: Soft, + BS.  Non tender, no guarding, rebound, hernias, masses. Lymphatics: Non tender without lymphadenopathy.  Musculoskeletal: Full ROM, 5/5 strength, normal gait.  Skin: Crown hair thinning with mild follicular erythema. Warm, dry without rashes, lesions, ecchymosis.  Neuro: Cranial nerves intact. No cerebellar symptoms. Sensation intact.  Psych: Awake and oriented X 3, normal affect, Insight and Judgment appropriate.    Vicie Mutters, PA-C 11:30 AM [facility]

## 2016-10-03 ENCOUNTER — Other Ambulatory Visit: Payer: Self-pay | Admitting: Internal Medicine

## 2016-10-03 ENCOUNTER — Other Ambulatory Visit: Payer: Self-pay | Admitting: Physician Assistant

## 2016-10-03 DIAGNOSIS — E1122 Type 2 diabetes mellitus with diabetic chronic kidney disease: Secondary | ICD-10-CM

## 2016-10-03 DIAGNOSIS — N182 Chronic kidney disease, stage 2 (mild): Secondary | ICD-10-CM

## 2016-10-05 ENCOUNTER — Other Ambulatory Visit: Payer: Self-pay

## 2016-10-05 ENCOUNTER — Ambulatory Visit (INDEPENDENT_AMBULATORY_CARE_PROVIDER_SITE_OTHER): Payer: 59 | Admitting: Physician Assistant

## 2016-10-05 ENCOUNTER — Encounter: Payer: Self-pay | Admitting: Physician Assistant

## 2016-10-05 VITALS — BP 128/74 | HR 84 | Ht 63.0 in | Wt 187.0 lb

## 2016-10-05 DIAGNOSIS — R079 Chest pain, unspecified: Secondary | ICD-10-CM | POA: Diagnosis not present

## 2016-10-05 DIAGNOSIS — I10 Essential (primary) hypertension: Secondary | ICD-10-CM

## 2016-10-05 DIAGNOSIS — E1122 Type 2 diabetes mellitus with diabetic chronic kidney disease: Secondary | ICD-10-CM | POA: Diagnosis not present

## 2016-10-05 DIAGNOSIS — M1A9XX0 Chronic gout, unspecified, without tophus (tophi): Secondary | ICD-10-CM

## 2016-10-05 DIAGNOSIS — N182 Chronic kidney disease, stage 2 (mild): Secondary | ICD-10-CM | POA: Diagnosis not present

## 2016-10-05 DIAGNOSIS — E785 Hyperlipidemia, unspecified: Secondary | ICD-10-CM

## 2016-10-05 DIAGNOSIS — Z136 Encounter for screening for cardiovascular disorders: Secondary | ICD-10-CM

## 2016-10-05 DIAGNOSIS — Z79899 Other long term (current) drug therapy: Secondary | ICD-10-CM

## 2016-10-05 DIAGNOSIS — F3341 Major depressive disorder, recurrent, in partial remission: Secondary | ICD-10-CM

## 2016-10-05 MED ORDER — MELOXICAM 7.5 MG PO TABS: ORAL_TABLET | ORAL | 0 refills | Status: DC

## 2016-10-05 NOTE — Patient Instructions (Addendum)
Prilosec 20mg  in the morning 30 mins before food and zantac at night 150mg   Go to the ER if any CP, SOB, nausea, dizziness, severe HA, changes vision/speech   Pericarditis Pericarditis is swelling and irritation (inflammation) of your pericardium. The pericardium is a thin, double-layered, fluid-filled sac that surrounds your heart. The pericardium protects and holds your heart in your chest cavity. Inflammation of your pericardium can cause rubbing (friction) between the two layers when your heart beats. Fluid may build up between the layers of the sac (pericardial effusion). Different types of pericarditis include:  Acute pericarditis. Inflammation develops suddenly and causes pericardial effusion.  Chronic pericarditis. Inflammation may develop gradually, or it may continue after acute pericarditis and last longer than 6 months.  Constrictive pericarditis. The layers of the pericardium stiffen and develop scar tissue. The scar tissue thickens and sticks together. This makes it difficult for the heart to pump and to work as it normally does. This type is rare.  In most cases, pericarditis is acute and not serious. Chronic pericarditis and constrictive pericarditis may be more serious and may require treatment. What are the causes? Often, the cause of pericarditis is not known.If a cause is found, the cause may be:  A viral infection.  A heart attack (myocardial infarction).  Open-heart surgery (coronary artery bypass graft surgery).  Chest injury.  Autoimmune conditions, such as lupus or rheumatoid arthritis.  Kidney failure.  Low-functioning thyroid gland (hypothyroidism).  Cancer from another part of the body that has spread (metastasized) to the pericardium.  Radiation treatment.  Certain medicines, including some seizure medicines, blood thinners, heart medicines, and antibiotics.  A bacterial or fungal infection. This cause is less common.  What increases the  risk? The following factors may increase your risk of pericarditis:  Being female.  Being 51-62 years old.  Having had pericarditis before.  Having had a recent upper respiratory tract infection.  What are the signs or symptoms? The most common symptom of pericarditis is chest pain. This pain may:  Be in the center of your chest or the left side of your chest.  Not go away with rest.  Last for many hours or days.  Worsen when you lie down and go away when you sit up and lean forward.  Worsen when you swallow.  Move to your back, neck, or shoulder.  Other symptoms may include:  A chronic, dry cough.  Heart palpitations. These may feel like rapid, fluttering, or pounding heartbeats.  Dizziness or fainting.  Tiredness or fatigue.  Fever.  Rapid breathing.  Shortness of breath when lying down.  How is this diagnosed? This condition is diagnosed with a medical history, physical exam, and diagnostic tests. During your physical exam, your health care provider will listen for friction while your heart beats (pericardial rub). You may also have tests, including:  Blood work to look for signs of infection and inflammation.  Electrocardiogram (ECG).  Echocardiogram.  CT scan.  MRI.  Culture of pericardial fluid.  A tissue sample (biopsy) of the pericardium.  If tests show that you may have constrictive pericarditis, you may have a procedure (cardiac catheterization) to confirm this diagnosis. How is this treated? Treatment for this condition depends on the cause and type of pericarditis. In most cases, acute pericarditis will clear up on its own within 10 days. Treatment for other types of pericarditis may include:  Medicines, such as: ? NSAIDs for pain and inflammation. ? Steroids to reduce inflammation. ? Colchicine to relieve pain  and inflammation.  A procedure to remove fluid using a needle (pericardiocentesis) if pericardial effusion puts pressure on the  heart.  Surgery to remove part of the pericardium if constrictive pericarditis develops.  If another condition is causing your pericarditis, you may need treatment for that underlying condition. Follow these instructions at home:  Do not use tobacco products, including cigarettes, chewing tobacco, or e-cigarettes. If you need help quitting, ask your health care provider.  Maintain a healthy weight.  Follow an exercise program as told by your health care provider. You may need to limit your exercise until your symptoms go away.  Eat a heart-healthy diet. A registered dietitian can help you to learn about healthy food choices.  Take over-the-counter and prescription medicines only as told by your health care provider. Keep a list of all of your medicines with you at all times. For each medicine, include information about the name, the dosage, how often you take it, and how you take it.  Keep all follow-up visits as told by your health care provider. This is important. Contact a health care provider if:  You continue to have symptoms of pericarditis.  You develop new symptoms of pericarditis.  Your symptoms get worse. Get help right away if:  You have worsening chest pain and difficulty breathing. These symptoms may represent a serious problem that is an emergency. Do not wait to see if the symptoms will go away. Get medical help right away. Call your local emergency services (911 in the [place] Do not drive yourself to the hospital. This information is not intended to replace advice given to you by your health care provider. Make sure you discuss any questions you have with your health care provider. Document Released: 07/22/2000 Document Revised: 07/01/2015 Document Reviewed: 08/08/2014 Elsevier Interactive Patient Education  [employer].

## 2016-10-06 ENCOUNTER — Other Ambulatory Visit: Payer: Self-pay | Admitting: Physician Assistant

## 2016-10-06 MED ORDER — COLCHICINE 0.6 MG PO CAPS: ORAL_CAPSULE | ORAL | 0 refills | Status: DC

## 2016-10-07 ENCOUNTER — Encounter: Payer: Self-pay | Admitting: Physician Assistant

## 2016-10-08 LAB — LIPID PANEL
CHOLESTEROL: 152 mg/dL (ref <200)
HDL: 35 mg/dL — ABNORMAL LOW (ref >50)
LDL CHOLESTEROL (CALC): 85 mg/dL
Non-HDL Cholesterol (Calc): 117 mg/dL (calc) (ref <130)
Total CHOL/HDL Ratio: 4.3 (calc) (ref <5.0)
Triglycerides: 218 mg/dL — ABNORMAL HIGH (ref <150)

## 2016-10-08 LAB — HEPATIC FUNCTION PANEL
AG Ratio: 2 (calc) (ref 1.0–2.5)
ALT: 41 U/L — ABNORMAL HIGH (ref 6–29)
AST: 24 U/L (ref 10–35)
Albumin: 4.5 g/dL (ref 3.6–5.1)
Alkaline phosphatase (APISO): 78 U/L (ref 33–130)
Bilirubin, Direct: 0.1 mg/dL (ref 0.0–0.2)
Globulin: 2.3 g/dL (ref 1.9–3.7)
Indirect Bilirubin: 0.2 mg/dL (ref 0.2–1.2)
Total Bilirubin: 0.3 mg/dL (ref 0.2–1.2)
Total Protein: 6.8 g/dL (ref 6.1–8.1)

## 2016-10-08 LAB — TSH: TSH: 1.13 m[IU]/L (ref 0.40–4.50)

## 2016-10-08 LAB — BASIC METABOLIC PANEL WITHOUT GFR
BUN: 13 mg/dL (ref 7–25)
CO2: 29 mmol/L (ref 20–32)
Calcium: 10.3 mg/dL (ref 8.6–10.4)
Chloride: 104 mmol/L (ref 98–110)
Creat: 0.84 mg/dL (ref 0.50–0.99)
GFR, Est African American: 87 mL/min/1.73m2
GFR, Est Non African American: 75 mL/min/1.73m2
Glucose, Bld: 217 mg/dL — ABNORMAL HIGH (ref 65–139)
Potassium: 4.2 mmol/L (ref 3.5–5.3)
Sodium: 141 mmol/L (ref 135–146)

## 2016-10-08 LAB — TROPONIN I: Troponin I: <0.01 ng/mL (ref <=0.0)

## 2016-10-08 LAB — ANA: ANA: NEGATIVE

## 2016-10-08 LAB — CBC WITH DIFFERENTIAL/PLATELET
Basophils Absolute: 50 {cells}/uL (ref 0–200)
Basophils Relative: 0.8 %
Eosinophils Absolute: 170 {cells}/uL (ref 15–500)
Eosinophils Relative: 2.7 %
HCT: 44 % (ref 35.0–45.0)
Hemoglobin: 15 g/dL (ref 11.7–15.5)
Lymphs Abs: 1959 {cells}/uL (ref 850–3900)
MCH: 32.1 pg (ref 27.0–33.0)
MCHC: 34.1 g/dL (ref 32.0–36.0)
MCV: 94.2 fL (ref 80.0–100.0)
MPV: 10.8 fL (ref 7.5–12.5)
Monocytes Relative: 6.1 %
Neutro Abs: 3736 {cells}/uL (ref 1500–7800)
Neutrophils Relative %: 59.3 %
Platelets: 194 Thousand/uL (ref 140–400)
RBC: 4.67 Million/uL (ref 3.80–5.10)
RDW: 12.5 % (ref 11.0–15.0)
Total Lymphocyte: 31.1 %
WBC mixed population: 384 {cells}/uL (ref 200–950)
WBC: 6.3 Thousand/uL (ref 3.8–10.8)

## 2016-10-08 LAB — C-REACTIVE PROTEIN: CRP: 6 mg/L (ref <8.0)

## 2016-10-08 LAB — MAGNESIUM: MAGNESIUM: 1.9 mg/dL (ref 1.5–2.5)

## 2016-10-08 LAB — HEMOGLOBIN A1C
EAG (MMOL/L): 10.6 (calc)
HEMOGLOBIN A1C: 8.3 %{Hb} — AB (ref <5.7)
MEAN PLASMA GLUCOSE: 192 (calc)

## 2016-10-08 LAB — URIC ACID: URIC ACID, SERUM: 7.1 mg/dL — AB (ref 2.5–7.0)

## 2016-10-08 LAB — CK TOTAL AND CKMB (NOT AT ARMC)
CK, MB: <0.7 ng/mL (ref 0–5.0)
Total CK: 40 U/L (ref 29–143)

## 2016-10-13 DIAGNOSIS — G4733 Obstructive sleep apnea (adult) (pediatric): Secondary | ICD-10-CM | POA: Diagnosis not present

## 2016-10-15 ENCOUNTER — Encounter: Payer: Self-pay | Admitting: Physician Assistant

## 2016-10-16 ENCOUNTER — Ambulatory Visit (INDEPENDENT_AMBULATORY_CARE_PROVIDER_SITE_OTHER): Payer: 59 | Admitting: Physician Assistant

## 2016-10-16 ENCOUNTER — Other Ambulatory Visit: Payer: Self-pay | Admitting: Physician Assistant

## 2016-10-16 VITALS — BP 122/80 | HR 68 | Temp 97.8°F

## 2016-10-16 DIAGNOSIS — R079 Chest pain, unspecified: Secondary | ICD-10-CM | POA: Diagnosis not present

## 2016-10-16 NOTE — Patient Instructions (Addendum)
Continue the prilosec  Continue mitigate (INCREASE TO 2 A DAY OVER THE WEEKEND) and mobic  IF ANY WORSENING CHEST PAIN OR ANY SHORTNESS OF BREATH GO TO ER   Nonspecific Chest Pain Chest pain can be caused by many different conditions. There is always a chance that your pain could be related to something serious, such as a heart attack or a blood clot in your lungs. Chest pain can also be caused by conditions that are not life-threatening. If you have chest pain, it is very important to follow up with your health care provider. What are the causes? Causes of this condition include:  Heartburn.  Pneumonia or bronchitis.  Anxiety or stress.  Inflammation around your heart (pericarditis) or lung (pleuritis or pleurisy).  A blood clot in your lung.  A collapsed lung (pneumothorax). This can develop suddenly on its own (spontaneous pneumothorax) or from trauma to the chest.  Shingles infection (varicella-zoster virus).  Heart attack.  Damage to the bones, muscles, and cartilage that make up your chest wall. This can include: ? Bruised bones due to injury. ? Strained muscles or cartilage due to frequent or repeated coughing or overwork. ? Fracture to one or more ribs. ? Sore cartilage due to inflammation (costochondritis).  What increases the risk? Risk factors for this condition may include:  Activities that increase your risk for trauma or injury to your chest.  Respiratory infections or conditions that cause frequent coughing.  Medical conditions or overeating that can cause heartburn.  Heart disease or family history of heart disease.  Conditions or health behaviors that increase your risk of developing a blood clot.  Having had chicken pox (varicella zoster).  What are the signs or symptoms? Chest pain can feel like:  Burning or tingling on the surface of your chest or deep in your chest.  Crushing, pressure, aching, or squeezing pain.  Dull or sharp pain that is  worse when you move, cough, or take a deep breath.  Pain that is also felt in your back, neck, shoulder, or arm, or pain that spreads to any of these areas.  Your chest pain may come and go, or it may stay constant. How is this diagnosed? Lab tests or other studies may be needed to find the cause of your pain. Your health care provider may have you take a test called an ECG (electrocardiogram). An ECG records your heartbeat patterns at the time the test is performed. You may also have other tests, such as:  Transthoracic echocardiogram (TTE). In this test, sound waves are used to create a picture of the heart structures and to look at how blood flows through your heart.  Transesophageal echocardiogram (TEE).This is a more advanced imaging test that takes images from inside your body. It allows your health care provider to see your heart in finer detail.  Cardiac monitoring. This allows your health care provider to monitor your heart rate and rhythm in real time.  Holter monitor. This is a portable device that records your heartbeat and can help to diagnose abnormal heartbeats. It allows your health care provider to track your heart activity for several days, if needed.  Stress tests. These can be done through exercise or by taking medicine that makes your heart beat more quickly.  Blood tests.  Other imaging tests.  How is this treated? Treatment depends on what is causing your chest pain. Treatment may include:  Medicines. These may include: ? Acid blockers for heartburn. ? Anti-inflammatory medicine. ? Pain  medicine for inflammatory conditions. ? Antibiotic medicine, if an infection is present. ? Medicines to dissolve blood clots. ? Medicines to treat coronary artery disease (CAD).  Supportive care for conditions that do not require medicines. This may include: ? Resting. ? Applying heat or cold packs to injured areas. ? Limiting activities until pain decreases.  Follow these  instructions at home: Medicines  If you were prescribed an antibiotic, take it as told by your health care provider. Do not stop taking the antibiotic even if you start to feel better.  Take over-the-counter and prescription medicines only as told by your health care provider. Lifestyle  Do not use any products that contain nicotine or tobacco, such as cigarettes and e-cigarettes. If you need help quitting, ask your health care provider.  Do not drink alcohol.  Make lifestyle changes as directed by your health care provider. These may include: ? Getting regular exercise. Ask your health care provider to suggest some activities that are safe for you. ? Eating a heart-healthy diet. A registered dietitian can help you to learn healthy eating options. ? Maintaining a healthy weight. ? Managing diabetes, if necessary. ? Reducing stress, such as with yoga or relaxation techniques. General instructions  Avoid any activities that bring on chest pain.  If heartburn is the cause for your chest pain, raise (elevate) the head of your bed about 6 inches (15 cm) by putting blocks under the legs. Sleeping with more pillows does not effectively relieve heartburn because it only changes the position of your head.  Keep all follow-up visits as told by your health care provider. This is important. This includes any further testing if your chest pain does not go away. Contact a health care provider if:  Your chest pain does not go away.  You have a rash with blisters on your chest.  You have a fever.  You have chills. Get help right away if:  Your chest pain is worse.  You have a cough that gets worse, or you cough up blood.  You have severe pain in your abdomen.  You have severe weakness.  You faint.  You have sudden, unexplained chest discomfort.  You have sudden, unexplained discomfort in your arms, back, neck, or jaw.  You have shortness of breath at any time.  You suddenly start  to sweat, or your skin gets clammy.  You feel nauseous or you vomit.  You suddenly feel light-headed or dizzy.  Your heart begins to beat quickly, or it feels like it is skipping beats. These symptoms may represent a serious problem that is an emergency. Do not wait to see if the symptoms will go away. Get medical help right away. Call your local emergency services (911 in the [place] Do not drive yourself to the hospital. This information is not intended to replace advice given to you by your health care provider. Make sure you discuss any questions you have with your health care provider. Document Released: 11/05/2004 Document Revised: 10/21/2015 Document Reviewed: 10/21/2015 Elsevier Interactive Patient Education  2017 [employer].

## 2016-10-16 NOTE — Progress Notes (Signed)
Subjective:    Patient ID: Amanda Cooper, female    DOB: [birth year], 61 y.o.   MRN: [id number]  HPI 61 y.o. WF with history of DM, HTN, chol, family history of CAD presents with left arm pain. She was seen 08/27 for regular follow up but mentioned CP worse with lying down, chest into back, no SOB, able to exert without issues, EKG shows diffuse ST depression, elevated uric acid, normal troponin, CKMB, ANA now complains of constant chest pain, radiation down left arm and pain with deep breath/lying down.   Blood pressure 122/80, pulse 68, temperature 97.8 F (36.6 C).  Future Appointments Date Time Provider Ronald  03/01/2017 9:00 AM Vicie Mutters, PA-C [id number] None    Medications Current Outpatient Prescriptions on File Prior to Visit  Medication Sig  . aspirin 81 MG tablet Take 81 mg by mouth daily.  . Calcium Carbonate-Vit D-Min (CALCIUM 1200 PO) Take by mouth daily.  . Cholecalciferol (VITAMIN D) 2000 UNITS tablet Take 2,000 Units by mouth 2 (two) times daily.  . Colchicine (MITIGARE) 0.6 MG CAPS 1 tablet daily for gout  . cyclobenzaprine (FLEXERIL) 10 MG tablet 1 po q 8 hrs prn spasm  . fluconazole (DIFLUCAN) 150 MG tablet Take 1 tablet (150 mg total) by mouth daily.  Marland Kitchen glimepiride (AMARYL) 4 MG tablet TAKE 1 TABLET (4 MG TOTAL) BY MOUTH DAILY WITH BREAKFAST.  Marland Kitchen INVOKAMET XR [phone] MG TB24 TAKE 2 TABLETS BY MOUTH EVERY DAY  . lisinopril (PRINIVIL,ZESTRIL) 10 MG tablet TAKE 1 TABLET EVERY DAY  . meloxicam (MOBIC) 7.5 MG tablet TAKE 1 TABLET BID EVERY DAY WITH FOOD FOR PAIN AND SWELLING  . Multiple Vitamin (MULTIVITAMIN) capsule Take 1 capsule by mouth daily.  . Omega-3 Fatty Acids (FISH OIL) 1000 MG CAPS Take by mouth daily.  Glory Rosebush VERIO test strip TEST SUGAR 2-3 TIMES A DAY  . phentermine (ADIPEX-P) 37.5 MG tablet Take 1 tablet (37.5 mg total) by mouth daily before breakfast.  . TRADJENTA 5 MG TABS tablet TAKE 1 TABLET BY MOUTH EVERY DAY   No current  facility-administered medications on file prior to visit.     Problem list She has Hyperlipidemia; CKD stage 2 due to type 2 diabetes mellitus ([place]); Anemia; Anxiety; Depression, major, in partial remission ([place]); Colon polyps; Essential hypertension; Gout; Obstructive sleep apnea on CPAP; Vitamin D deficiency; Secondary adhesive capsulitis of right shoulder; Stress incontinence in female; and Morbid obesity ([place]) on her problem list.   Review of Systems  Constitutional: Negative.  Negative for chills, fatigue and fever.  HENT: Negative.   Respiratory: Negative.  Negative for shortness of breath.   Cardiovascular: Positive for chest pain. Negative for palpitations and leg swelling.  Gastrointestinal: Negative.   Genitourinary: Negative.   Musculoskeletal: Positive for arthralgias and back pain.  Skin: Negative.   Neurological: Negative.   Hematological: Negative.   Psychiatric/Behavioral: Negative.        Objective:   Physical Exam  Constitutional: She is oriented to person, place, and time. She appears well-developed and well-nourished.  HENT:  Head: Normocephalic.  Eyes: Conjunctivae are normal. No scleral icterus.  Neck: Normal range of motion.  Cardiovascular: Normal rate, regular rhythm, normal heart sounds and intact distal pulses.   Pulmonary/Chest: Effort normal and breath sounds normal.  Abdominal: Soft. Bowel sounds are normal. She exhibits no distension and no mass. There is no tenderness. There is no rebound and no guarding.  Musculoskeletal: Normal range of motion.  Neurological: She is  alert and oriented to person, place, and time.  Skin: Skin is warm and dry. No rash noted.  Psychiatric: She has a normal mood and affect. Judgment normal.  Nursing note and vitals reviewed.     Assessment & Plan:   Chest pain, unspecified type Normal EKG, better than previous Continue medications If any changes in symptoms, any SOB go to ER Has follow up at cardio -     EKG  12-Lead

## 2016-10-20 NOTE — Progress Notes (Signed)
Cardiology Office Note   Date:  10/22/2016   ID:  Amanda Cooper, DOB October 31, 1955, MRN [id number]  PCP:  Unk Pinto, MD  Cardiologist:   Minus Breeding, MD  Referring:  Unk Pinto, MD  Chief Complaint  Patient presents with  . Chest Pain      History of Present Illness: Amanda Cooper is a 61 y.o. female who is referred by Unk Pinto, MD for evaluation of chest pain.  She reports having a treadmill test years ago but otherwise hasn't had any cardiac issues. Over the past year she's had chest discomfort. This starts in her left shoulder and radiates to the front of her chest. It's been on the left sided of her breast and has been in the mid chest as well.  She says it feels like a stabbing discomfort. It waxes and wanes in intensity. It is constantly there however.  She says it is worse with deep breathing. It can be 10 out of 10 in intensity. It seems to be worse lying down. It has been progressively more intense.  She is not describing associated symptoms such as nausea vomiting or diaphoresis there has been no palpitations, presyncope or syncope.  She did have some nonspecific EKG changes and was thought that she might have pericarditis. She's been treated with colchicine for gout and possible pericarditis.   she's not had any fevers chills or cough.     Past Medical History:  Diagnosis Date  . Anemia   . Anxiety   . Colon polyps   . Diabetes mellitus without complication ([place])   . Gout   . Hypertriglyceridemia   . Obstructive sleep apnea on CPAP    uses CPAP nightly  . Secondary adhesive capsulitis of right shoulder 10/10/2015    Past Surgical History:  Procedure Laterality Date  . ABDOMINAL HYSTERECTOMY    . achilles Right 2009   Bednarz- reattached achilles/ removed spur/ lengthened calf  . BREAST SURGERY Left    asp cyst  . EYE SURGERY    . OVARY SURGERY Left 1999  . SHOULDER ARTHROSCOPY Right 2017     Current Outpatient Prescriptions    Medication Sig Dispense Refill  . aspirin 81 MG tablet Take 81 mg by mouth daily.    . Calcium Carbonate-Vit D-Min (CALCIUM 1200 PO) Take by mouth daily.    . Cholecalciferol (VITAMIN D) 2000 UNITS tablet Take 2,000 Units by mouth 2 (two) times daily.    . Colchicine (MITIGARE) 0.6 MG CAPS 1 tablet daily for gout 30 capsule 0  . cyclobenzaprine (FLEXERIL) 10 MG tablet 1 po q 8 hrs prn spasm 60 tablet 1  . fluconazole (DIFLUCAN) 150 MG tablet Take 150 mg by mouth as needed.    Marland Kitchen glimepiride (AMARYL) 4 MG tablet TAKE 1 TABLET (4 MG TOTAL) BY MOUTH DAILY WITH BREAKFAST. 90 tablet 1  . INVOKAMET XR [phone] MG TB24 TAKE 2 TABLETS BY MOUTH EVERY DAY 180 tablet 2  . lisinopril (PRINIVIL,ZESTRIL) 10 MG tablet TAKE 1 TABLET EVERY DAY 90 tablet 1  . meloxicam (MOBIC) 7.5 MG tablet TAKE 1 TABLET BID EVERY DAY WITH FOOD FOR PAIN AND SWELLING 180 tablet 0  . Multiple Vitamin (MULTIVITAMIN) capsule Take 1 capsule by mouth daily.    . Omega-3 Fatty Acids (FISH OIL) 1000 MG CAPS Take by mouth daily.    Glory Rosebush VERIO test strip TEST SUGAR 2-3 TIMES A DAY 100 each 2  . phentermine (ADIPEX-P) 37.5 MG tablet Take  1 tablet (37.5 mg total) by mouth daily before breakfast. 30 tablet 2  . TRADJENTA 5 MG TABS tablet TAKE 1 TABLET BY MOUTH EVERY DAY 90 tablet 1   No current facility-administered medications for this visit.     Allergies:   Clinoril [sulindac]; Erythromycin; and Shrimp [shellfish allergy]    Social History:  The patient  reports that she has never smoked. She has never used smokeless tobacco. She reports that she does not drink alcohol or use drugs.   Family History:  The patient's family history includes Alcohol abuse in her father; COPD in her mother; Depression in her mother; Diabetes in her sister; Heart attack (age of onset: 74) in her father; Heart disease in her father.    ROS:  Please see the history of present illness.   Otherwise, review of systems are positive for none.   All other  systems are reviewed and negative.    PHYSICAL EXAM: VS:  BP 120/76   Pulse 76   Ht 5\' 3"  (1.6 m)   Wt 192 lb 12.8 oz (87.5 kg)   BMI 34.15 kg/m  , BMI Body mass index is 34.15 kg/m. GENERAL:  Well appearing HEENT:  Pupils equal round and reactive, fundi not visualized, oral mucosa unremarkable NECK:  No jugular venous distention, waveform within normal limits, carotid upstroke brisk and symmetric, no bruits, no thyromegaly LYMPHATICS:  No cervical, inguinal adenopathy LUNGS:  Clear to auscultation bilaterally BACK:  No CVA tenderness CHEST:  Unremarkable HEART:  PMI not displaced or sustained,S1 and S2 within normal limits, no S3, no S4, no clicks, no rubs, no murmurs ABD:  Flat, positive bowel sounds normal in frequency in pitch, no bruits, no rebound, no guarding, no midline pulsatile mass, no hepatomegaly, no splenomegaly EXT:  2 plus pulses throughout, trace edema, no cyanosis no clubbing SKIN:  No rashes no nodules NEURO:  Cranial nerves II through XII grossly intact, motor grossly intact throughout PSYCH:  Cognitively intact, oriented to person place and time    EKG:  EKG is ordered today. The ekg ordered today demonstratessinus rhythm, rate 76, axis within normal limits, intervals within normal limits, nonspecific inferior and lateral T-wave inversions not changed from previous, RSR prime V1  Recent Labs: 10/05/2016: ALT 41; BUN 13; Creat 0.84; Hemoglobin 15.0; Magnesium 1.9; Platelets 194; Potassium 4.2; Sodium 141; TSH 1.13   Lab Results  Component Value Date   HGBA1C 8.3 (H) 10/05/2016    Lipid Panel    Component Value Date/Time   CHOL 152 10/05/2016 1214   TRIG 218 (H) 10/05/2016 1214   HDL 35 (L) 10/05/2016 1214   CHOLHDL 4.3 10/05/2016 1214   VLDL 48 (H) 06/23/2016 0945   LDLCALC 62 06/23/2016 0945      Wt Readings from Last 3 Encounters:  10/21/16 192 lb 12.8 oz (87.5 kg)  10/05/16 187 lb (84.8 kg)  06/23/16 188 lb 9.6 oz (85.5 kg)      Other  studies Reviewed: Additional studies/ records that were reviewed today include: EKGs. Review of the above records demonstrates:  Please see elsewhere in the note.     ASSESSMENT AND PLAN:   CHEST PAIN:   The chest pain is somewhat atypical but she does have significant risk factors with her diabetes.  I will bring the patient back for a POET (Plain Old Exercise Test). This will allow me to screen for obstructive coronary disease, risk stratify and very importantly provide a prescription for exercise.  I think is  a low likelihood that this could be pericarditis but I will check an echocardiogram.   DYSLIPIDEMIA: She reports that this has only been hypertriglyceridemia. This is mildly elevated and I will defer to her primary provider.  HTN:  She denies HTN and says the lisinopril is for her kidneys.    DM:  A1C as above.  Continue current therapy.   Current medicines are reviewed at length with the patient today.  The patient does not have concerns regarding medicines.  The following changes have been made:  no change  Labs/ tests ordered today include:   Orders Placed This Encounter  Procedures  . Exercise Tolerance Test  . ECHOCARDIOGRAM COMPLETE     Disposition:   FU with me as needed based on the results.      Signed, Minus Breeding, MD  10/22/2016 8:14 AM    [facility]

## 2016-10-21 ENCOUNTER — Ambulatory Visit (INDEPENDENT_AMBULATORY_CARE_PROVIDER_SITE_OTHER): Payer: 59 | Admitting: Cardiology

## 2016-10-21 ENCOUNTER — Encounter: Payer: Self-pay | Admitting: Cardiology

## 2016-10-21 VITALS — BP 120/76 | HR 76 | Ht 63.0 in | Wt 192.8 lb

## 2016-10-21 DIAGNOSIS — E785 Hyperlipidemia, unspecified: Secondary | ICD-10-CM

## 2016-10-21 DIAGNOSIS — I10 Essential (primary) hypertension: Secondary | ICD-10-CM | POA: Diagnosis not present

## 2016-10-21 DIAGNOSIS — R079 Chest pain, unspecified: Secondary | ICD-10-CM | POA: Diagnosis not present

## 2016-10-21 NOTE — Patient Instructions (Signed)
Medication Instructions: Your physician recommends that you continue on your current medications as directed. Please refer to the Current Medication list given to you today.  If you need a refill on your cardiac medications before your next appointment, please call your pharmacy.    Procedures/Testing: Your physician has requested that you have an exercise tolerance test. For further information please visit [email or web address]. This will be done at [place], [address].  Your physician has requested that you have an echocardiogram. Echocardiography is a painless test that uses sound waves to create images of your heart. It provides your doctor with information about the size and shape of your heart and how well your heart's chambers and valves are working. This procedure takes approximately one hour. There are no restrictions for this procedure. This will be done at [address]  Follow-Up: Your physician wants you to follow up as needed.   Thank you for choosing [facility] at [facility]!!

## 2016-10-22 ENCOUNTER — Encounter: Payer: Self-pay | Admitting: Cardiology

## 2016-10-26 ENCOUNTER — Encounter: Payer: Self-pay | Admitting: Internal Medicine

## 2016-10-28 ENCOUNTER — Other Ambulatory Visit: Payer: Self-pay

## 2016-10-28 ENCOUNTER — Ambulatory Visit (HOSPITAL_COMMUNITY): Payer: 59 | Attending: Cardiovascular Disease

## 2016-10-28 DIAGNOSIS — I10 Essential (primary) hypertension: Secondary | ICD-10-CM | POA: Insufficient documentation

## 2016-10-28 DIAGNOSIS — E785 Hyperlipidemia, unspecified: Secondary | ICD-10-CM | POA: Diagnosis not present

## 2016-10-28 DIAGNOSIS — R079 Chest pain, unspecified: Secondary | ICD-10-CM | POA: Diagnosis not present

## 2016-10-30 ENCOUNTER — Telehealth (HOSPITAL_COMMUNITY): Payer: Self-pay

## 2016-10-30 ENCOUNTER — Encounter: Payer: Self-pay | Admitting: Physician Assistant

## 2016-10-30 NOTE — Telephone Encounter (Signed)
Encounter complete. 

## 2016-11-04 ENCOUNTER — Ambulatory Visit (HOSPITAL_COMMUNITY)
Admission: RE | Admit: 2016-11-04 | Discharge: 2016-11-04 | Disposition: A | Discharge status: Home / Self Care | Payer: 59 | Source: Ambulatory Visit | Attending: Cardiology | Admitting: Cardiology

## 2016-11-04 DIAGNOSIS — R079 Chest pain, unspecified: Secondary | ICD-10-CM | POA: Insufficient documentation

## 2016-11-04 LAB — EXERCISE TOLERANCE TEST
CHL CUP MPHR: 159 {beats}/min
CHL CUP RESTING HR STRESS: 77 {beats}/min
CSEPED: 7 min
CSEPPHR: 173 {beats}/min
Estimated workload: 8.5 METS
Exercise duration (sec): 1 s
Percent HR: 108 %
RPE: 17

## 2016-11-04 MED ORDER — TRAMADOL HCL 50 MG PO TABS: ORAL_TABLET | ORAL | 0 refills | Status: DC

## 2016-12-29 ENCOUNTER — Other Ambulatory Visit: Payer: Self-pay | Admitting: Internal Medicine

## 2017-01-09 ENCOUNTER — Other Ambulatory Visit: Payer: Self-pay | Admitting: Physician Assistant

## 2017-01-10 ENCOUNTER — Other Ambulatory Visit: Payer: Self-pay | Admitting: Internal Medicine

## 2017-01-13 ENCOUNTER — Encounter: Payer: Self-pay | Admitting: Physician Assistant

## 2017-01-13 MED ORDER — PHENTERMINE HCL 37.5 MG PO TABS: 37.5000 mg | ORAL_TABLET | Freq: Every day | ORAL | 1 refills | Status: DC

## 2017-02-22 DIAGNOSIS — Z1231 Encounter for screening mammogram for malignant neoplasm of breast: Secondary | ICD-10-CM | POA: Diagnosis not present

## 2017-02-22 LAB — HM MAMMOGRAPHY: HM MAMMO: NORMAL (ref 0–4)

## 2017-02-25 NOTE — Progress Notes (Signed)
Complete Physical  Assessment and Plan:  Essential hypertension - continue medications, DASH diet, exercise and monitor at home. Call if greater than 130/80.  - Urinalysis, Routine w reflex microscopic (not at [facility]) - Microalbumin / creatinine urine ratio - EKG 12-Lead  CKD stage 2 due to type 2 diabetes mellitus ([place]) Discussed general issues about diabetes pathophysiology and management., Educational material distributed., Suggested low cholesterol diet., Encouraged aerobic exercise., Discussed foot care., Reminded to get yearly retinal exam- will get information from [facility]  Hyperlipidemia -continue medications, check lipids, decrease fatty foods, increase activity.   Obstructive sleep apnea on CPAP Sleep apnea- continue CPAP, weight loss advised.   Colon polyps No due until 2023  Vitamin D deficiency Continue supplement  Gout without tophus, unspecified cause, unspecified chronicity, unspecified site Check uric acid  Depression, remission remission   Anemia, unspecified anemia type Check CBC   Anxiety improved  Medication management  Encounter for general adult medical examination with abnormal findings 1 year  Stress incontinence in female Monitor symptoms, kegal discussed  BMI 32.0-32.9,adult  Overweight  - long discussion about weight loss, diet, and exercise -recommended diet heavy in fruits and veggies and low in animal meats, cheeses, and dairy products   Discussed med's effects and SE's. Screening labs and tests as requested with regular follow-up as recommended. Over 40 minutes of exam, counseling, chart review, and complex, high level critical decision making was performed this visit.   HPI  62 y.o. female  presents for a complete physical.  Her blood pressure has been controlled at home, today their BP is BP: 122/68 She does workout, 3 days a week 45 mins walking. She denies chest pain, shortness of breath, dizziness.  She had negative stress  test 11/04/2016 for left chest pain, she continues to have pain, radiates around, worse lying down.  She is not on cholesterol medication and denies myalgias. Her cholesterol is at goal. The cholesterol last visit was:   Lab Results  Component Value Date   CHOL 152 10/05/2016   HDL 35 (L) 10/05/2016   LDLCALC 62 06/23/2016   TRIG 218 (H) 10/05/2016   CHOLHDL 4.3 10/05/2016   She has been working on diet and exercise for diabetes, she is on bASA, she is on ACE/ARB, she is on invokamet and trajenta and denies paresthesia of the feet, polydipsia, polyuria and visual disturbances. Last A1C in the office was:  Lab Results  Component Value Date   HGBA1C 8.3 (H) 10/05/2016   Last GFR: Lab Results  Component Value Date   GFRNONAA 75 10/05/2016   Patient is on Vitamin D supplement.   Lab Results  Component Value Date   VD25OH 57 01/07/2016     BMI is Body mass index is 32.81 kg/m., she is working on diet and exercise. Has been taking the Phentermine but not consistently,  Wt Readings from Last 3 Encounters:  03/01/17 185 lb 3.2 oz (84 kg)  10/21/16 192 lb 12.8 oz (87.5 kg)  10/05/16 187 lb (84.8 kg)    Current Medications:  Current Outpatient Medications on File Prior to Visit  Medication Sig Dispense Refill  . aspirin 81 MG tablet Take 81 mg by mouth daily.    . Calcium Carbonate-Vit D-Min (CALCIUM 1200 PO) Take by mouth daily.    . Cholecalciferol (VITAMIN D) 2000 UNITS tablet Take 2,000 Units by mouth 2 (two) times daily.    . Colchicine (MITIGARE) 0.6 MG CAPS 1 tablet daily for gout 30 capsule 0  .  cyclobenzaprine (FLEXERIL) 10 MG tablet 1 po q 8 hrs prn spasm 60 tablet 1  . glimepiride (AMARYL) 4 MG tablet TAKE 1 TABLET (4 MG TOTAL) BY MOUTH DAILY WITH BREAKFAST. 90 tablet 1  . INVOKAMET XR [phone] MG TB24 TAKE 2 TABLETS BY MOUTH EVERY DAY 180 tablet 2  . lisinopril (PRINIVIL,ZESTRIL) 10 MG tablet TAKE 1 TABLET EVERY DAY 90 tablet 1  . meloxicam (MOBIC) 7.5 MG tablet TAKE 1  TABLET TWICE A DAY WITH FOOD FOR PAIN AND SWELLING 180 tablet 0  . Multiple Vitamin (MULTIVITAMIN) capsule Take 1 capsule by mouth daily.    . Omega-3 Fatty Acids (FISH OIL) 1000 MG CAPS Take by mouth daily.    Glory Rosebush VERIO test strip TEST SUGAR 2-3 TIMES A DAY 100 each 3  . phentermine (ADIPEX-P) 37.5 MG tablet Take 1 tablet (37.5 mg total) by mouth daily before breakfast. 30 tablet 1  . TRADJENTA 5 MG TABS tablet TAKE 1 TABLET BY MOUTH EVERY DAY 90 tablet 1  . traMADol (ULTRAM) 50 MG tablet 1 pill twice daily as needed for pain 60 tablet 0   No current facility-administered medications on file prior to visit.    Health Maintenance:   Immunization History  Administered Date(s) Administered  . PPD Test 02/22/2013, 02/23/2014  . Td 02/09/2001  . Tdap 02/23/2011  . Zoster 02/09/2006   Tetanus: 2013 Pneumovax: declines Prevnar 13: declines Flu vaccine: declines Zostavax: 2008  Pap:2018 MGM: 02/22/2017 normal at [facility], had recheck of breast but as benign.  DEXA: 2010 normal Colonoscopy: March 2013 due 2023  Last Eye Exam: Dr. Pricilla Riffle- will get information Patient Care Team: Unk Pinto, MD as PCP - General (Internal Medicine) Calvert Cantor, MD as Consulting Physician (Ophthalmology) Richmond Campbell, MD as Consulting Physician (Gastroenterology) Brien Few, MD as Consulting Physician (Obstetrics and Gynecology) Daryll Brod, MD as Consulting Physician (Orthopedic Surgery) Lavonna Monarch, MD as Consulting Physician (Dermatology)  Medical History:  Past Medical History:  Diagnosis Date  . Anemia   . Anxiety   . Colon polyps   . Diabetes mellitus without complication ([place])   . Gout   . Hypertriglyceridemia   . Obstructive sleep apnea on CPAP    uses CPAP nightly  . 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 Clinoril [Sulindac]   . Erythromycin   . Shrimp [Shellfish Allergy]     SURGICAL  HISTORY She  has a past surgical history that includes Abdominal hysterectomy; Breast surgery (Left); Ovary surgery (Left, 1999); achilles (Right, 2009); Eye surgery; and Shoulder arthroscopy (Right, 2017). FAMILY HISTORY Her family history includes Alcohol abuse in her father; COPD in her mother; Depression in her mother; Diabetes in her sister; Heart attack (age of onset: 83) in her father; Heart disease in her father. SOCIAL HISTORY She  reports that  has never smoked. she has never used smokeless tobacco. She reports that she does not drink alcohol or use drugs.  Review of Systems: Review of Systems  Constitutional: Negative.   HENT: Negative.   Eyes: Negative.   Respiratory: Negative.   Cardiovascular: Negative.   Gastrointestinal: Negative.   Genitourinary: Positive for frequency. Negative for dysuria, flank pain, hematuria and urgency.       Stress incontinence  Musculoskeletal: Negative.   Skin: Negative.   Neurological: Negative.   Endo/Heme/Allergies: Positive for polydipsia. Negative for environmental allergies. Does not bruise/bleed easily.  Psychiatric/Behavioral: Negative.     Physical Exam: Estimated body mass  index is 32.81 kg/m as calculated from the following:   Height as of this encounter: 5\' 3"  (1.6 m).   Weight as of this encounter: 185 lb 3.2 oz (84 kg). BP 122/68   Pulse 83   Temp (!) 97.2 F (36.2 C)   Resp 16   Ht 5\' 3"  (1.6 m)   Wt 185 lb 3.2 oz (84 kg)   SpO2 97%   BMI 32.81 kg/m  General Appearance: Well nourished, in no apparent distress.  Eyes: PERRLA, EOMs, conjunctiva no swelling or erythema, normal fundi and vessels.  Sinuses: No Frontal/maxillary tenderness  ENT/Mouth: Ext aud canals clear, normal light reflex with TMs without erythema, bulging. Good dentition. No erythema, swelling, or exudate on post pharynx. Tonsils not swollen or erythematous. Hearing normal.  Neck: Supple, thyroid normal. No bruits  Respiratory: Respiratory effort  normal, BS equal bilaterally without rales, rhonchi, wheezing or stridor.  Cardio: RRR without murmurs, rubs or gallops. Brisk peripheral pulses without edema.  Chest: symmetric, with normal excursions and percussion.  Breasts: Symmetric, without lumps, nipple discharge, retractions.  Abdomen: Soft, nontender, no guarding, rebound, hernias, masses, or organomegaly.  Lymphatics: Non tender without lymphadenopathy.  Genitourinary: defer Musculoskeletal: Full ROM all peripheral extremities,5/5 strength, and normal gait.  Skin: Warm, dry without rashes, lesions, ecchymosis. Neuro: Cranial nerves intact, reflexes equal bilaterally. Normal muscle tone, no cerebellar symptoms. Sensation intact.  Psych: Awake and oriented X 3, normal affect, Insight and Judgment appropriate.   EKG: defer this year, had cardiac work up [place]: defer  Vicie Mutters 9:00 AM [facility]

## 2017-02-26 DIAGNOSIS — N6489 Other specified disorders of breast: Secondary | ICD-10-CM | POA: Diagnosis not present

## 2017-02-26 DIAGNOSIS — N6324 Unspecified lump in the left breast, lower inner quadrant: Secondary | ICD-10-CM | POA: Diagnosis not present

## 2017-03-01 ENCOUNTER — Ambulatory Visit (INDEPENDENT_AMBULATORY_CARE_PROVIDER_SITE_OTHER): Payer: 59 | Admitting: Physician Assistant

## 2017-03-01 ENCOUNTER — Encounter: Payer: Self-pay | Admitting: Physician Assistant

## 2017-03-01 VITALS — BP 122/68 | HR 83 | Temp 97.2°F | Resp 16 | Ht 63.0 in | Wt 185.2 lb

## 2017-03-01 DIAGNOSIS — G4733 Obstructive sleep apnea (adult) (pediatric): Secondary | ICD-10-CM

## 2017-03-01 DIAGNOSIS — M1A9XX0 Chronic gout, unspecified, without tophus (tophi): Secondary | ICD-10-CM

## 2017-03-01 DIAGNOSIS — F419 Anxiety disorder, unspecified: Secondary | ICD-10-CM

## 2017-03-01 DIAGNOSIS — I10 Essential (primary) hypertension: Secondary | ICD-10-CM

## 2017-03-01 DIAGNOSIS — N393 Stress incontinence (female) (male): Secondary | ICD-10-CM

## 2017-03-01 DIAGNOSIS — E1122 Type 2 diabetes mellitus with diabetic chronic kidney disease: Secondary | ICD-10-CM

## 2017-03-01 DIAGNOSIS — Z6832 Body mass index (BMI) 32.0-32.9, adult: Secondary | ICD-10-CM

## 2017-03-01 DIAGNOSIS — K635 Polyp of colon: Secondary | ICD-10-CM

## 2017-03-01 DIAGNOSIS — Z0000 Encounter for general adult medical examination without abnormal findings: Secondary | ICD-10-CM | POA: Diagnosis not present

## 2017-03-01 DIAGNOSIS — D649 Anemia, unspecified: Secondary | ICD-10-CM

## 2017-03-01 DIAGNOSIS — E785 Hyperlipidemia, unspecified: Secondary | ICD-10-CM

## 2017-03-01 DIAGNOSIS — Z79899 Other long term (current) drug therapy: Secondary | ICD-10-CM

## 2017-03-01 DIAGNOSIS — Z9989 Dependence on other enabling machines and devices: Secondary | ICD-10-CM

## 2017-03-01 DIAGNOSIS — N182 Chronic kidney disease, stage 2 (mild): Secondary | ICD-10-CM

## 2017-03-01 DIAGNOSIS — R350 Frequency of micturition: Secondary | ICD-10-CM

## 2017-03-01 DIAGNOSIS — E559 Vitamin D deficiency, unspecified: Secondary | ICD-10-CM

## 2017-03-01 NOTE — Patient Instructions (Signed)
8 Critical Weight-Loss Tips That Aren't Diet and Exercise  1. STARVE THE DISTRACTIONS  All too often when we eat, we're also multitasking: watching TV, answering emails, scrolling through social media. These habits are detrimental to having a strong, clear, healthy relationship with food, and they can hinder our ability to make dietary changes.  In order to truly focus on what you're eating, how much you're eating, why you're eating those specific foods and, most importantly, how those foods make you feel, you need to starve the distractions. That means when you eat, just eat. Focus on your food, the process it went through to end up on your plate, where it came from and how it nourishes you. With this technique, you're more likely to finish a meal feeling satiated.  2.  CONSIDER WHAT YOU'RE NOT WILLING TO DO  This might sound counterintuitive, but it can help provide a "why" when motivation is waning. Declare, in writing, what you are unwilling to do, for example "I am unwilling to be the old dad who cannot play sports with my children".  So consider what you're not willing to accept, write it down, and keep it at the ready.  3.  STOP LABELING FOOD "GOOD" AND "BAD"  You've probably heard someone say they ate something "bad." Maybe you've even said it yourself.  The trouble with 'bad' foods isn't that they'll send you to the grave after a bite or two. The trouble comes when we eat excessive portions of really calorie-dense foods meal after meal, day after day.  Instead of labeling foods as good or bad, think about which foods you can eat a lot of, and which ones you should just eat a little of. Then, plan ways to eat the foods you really like in portions that fit with your overall goals. A good example of this would be having a slice of pizza alongside a club salad with chicken breast, avocado and a bit of dressing. This is vastly different than 3 slices of pizza, 4 breadsticks with cheese sauce  and half of a liter of regular soda.  4.  BRUSH YOUR TEETH AFTER YOU EAT  Getting your mindset in order is important, but sometimes small habits can make a big difference. After eating, you still have the taste of food in their mouth, which often causes people to eat more even if they are full or engage in a nibble or two of dessert.  Brushing your teeth will remove the taste of food from your mouth, and the clean, minty freshness will serve as a cue that mealtime is over.  5.  FOCUS ON CROWDING NOT CUTTING  The most common first step during 'dieting' is to cut. We cut our portion sizes down, we cut out 'bad' foods, we cut out entire food groups. This act of cutting puts us and our minds into scarcity mode.  When something is off-limits, even if you're able to avoid it for a while, you could end up bingeing on it later because you've gone so long without it. So, instead of cutting, focus on crowding. If you crowd your plate and fill it up with more foods like veggies and protein, it simply allows less room for the other stuff. In other words, shift your focus away from what you can't eat, and celebrate the foods that will help you reach your goals.  6.  TAKE TRACKING A STEP FURTHER  Track what you eat, when you ate it, how much you ate   and how that food made you feel. Being completely honest with yourself and writing down every single thing that passes through your lips will help you start to notice that maybe you actually do snack, possibly take in more sugar than you thought, eat when you're bored rather than just hungry or maybe that you have a habit of snacking before bed while watching TV.  The difference from simply tracking your food intake is you're taking into account how food makes you feel, as well as what you're doing while you're eating. This is about becoming more mindful of what, when and why you eat.  7.  PRIORITIZE GOOD SLEEP  One of the strongest risk factors for being  overweight is poor sleep. When you're feeling tired, you're more likely to choose unhealthy comfort foods and to skip your workout. Additionally, sleep deprivation may slow down your metabolism. [employer]! Therefore, sleeping 7-8 hours per night can help with weight loss without having to change your diet or increase your physical activity. And if you feel you snore and still wake up tired, talk with me about sleep apnea.  8.  SET ASIDE TIME TO DISCONNECT  Just get out there. Disconnect from the electronics and connect to the elements. Not only will this help reduce stress (a major factor in weight gain) by giving your mind a break from the constant stimulation we've all become so accustomed to, but it may also reprogram your brain to connect with yourself and what you're feeling.  SMART goals Having a solid fitness goal is an amazing way to power you towards success, but not all goals are created equal. While it's great to have an end-game in mind, there are some best practices when it comes to goal setting. Whether you want to lose weight, improve your fitness level, or train for an event, putting the SMART method into action can help you achieve what you set out to do.  SMART stands for specific, measurable, attainable, relevant, and timely-all of which are important in reaching a fitness objective. SMART goals can help keep you on track and remind you of your priorities, so you're able to follow through with every workout or healthy meal you have planned.   Get SMART and put these five elements into action when you're setting your fitness goal.  1. Specific  You need something that's not too arbitrary. For example, a bad goal would be, say, 'get healthy'. A specific goal would be to lose weight. You'll narrow down that goal even further by using the rest of the method, but whether you want to get stronger, faster, or smaller, having a baseline points you in the right direction.  2.  Measurable  Here's where you determine exactly how you'll measure your goal. If you're going to follow the bad goal, it would be get really healthy.That's not quantifiable. A measurable goal would be, say, 'lose 10 pounds'. You can quantify your progress, and you can sort of back into a time frame once you have that. Your goal may be to master a pull-up, run five miles, or go to the gym four days a week-whatever it is, you should have a definite way of knowing when you've reached your goal.  3. Attainable  While it can be helpful to set big-picture goals in the long-term, you need a more achievable goal on the horizon to keep you on track. You want to start small and see early wins, which encourages long-term consistency.  If you set  something too lofty right off the bat, it might be discouraging to not make progress as fast as you would like. You should also consider the size of your goal-for example, a goal of losing 30 pounds in one month just isn't going to happen, so you're better off setting smaller goals that are in closer reach.  4. Relevant  This is where things get a little tricky, finding your "why" is easier said than done. Ask yourself, 'is this goal worthwhile, and am I motivated to do it?' Creating a goal with some type of motivation attached to it, like I want to lose 10 pounds in two months to be ready for my wedding, can give a bit of relevancy to your goal. Whether you want to feel confident at a big event or perform better during everyday activities, pinpoint why a goal is important to you.  5. Timely  You want to be strict about a deadline-doing so creates urgency. It's also important not to set your sights too far out. If you give yourself four months to lose 10 pounds, that might be too long because you aren't incentivized to start working at it immediately. Instead, consider setting smaller goals along the way, like "I want to lose three pounds in two weeks." Maybe running a  marathon is your long-term goal, but if you've never been a runner, signing up for one that's a month away isn't realistic-instead, set smaller mileage goals for shorter time periods and work your way up.  You should also be honest with yourself about what you're able to accomplish in a given time frame. You just need to adjust your expectations so they're in line with your schedule and commitments.  Once you have your goal in place, it's all about the follow-through. Whether you want to lose one pound a week, be able to do five full push-ups in two weeks, or run a 5K in under 30 minutes in four weeks, you can come up with a plan to help get your where you want to go-but it all starts with deciding what you want. Be accountable to yourself, stay consistent, and the results will follow.  So try the exercise at home and at your next visit come in with your SMART goal and we can discuss how together we can achieve this goal!

## 2017-03-02 ENCOUNTER — Encounter: Payer: Self-pay | Admitting: Physician Assistant

## 2017-03-02 ENCOUNTER — Other Ambulatory Visit: Payer: Self-pay | Admitting: Physician Assistant

## 2017-03-02 DIAGNOSIS — R350 Frequency of micturition: Secondary | ICD-10-CM | POA: Diagnosis not present

## 2017-03-02 DIAGNOSIS — M1A9XX0 Chronic gout, unspecified, without tophus (tophi): Secondary | ICD-10-CM

## 2017-03-02 LAB — LIPID PANEL
Cholesterol: 150 mg/dL (ref <200)
HDL: 35 mg/dL — AB (ref >50)
LDL Cholesterol (Calc): 69 mg/dL (calc)
NON-HDL CHOLESTEROL (CALC): 115 mg/dL (ref <130)
Total CHOL/HDL Ratio: 4.3 (calc) (ref <5.0)
Triglycerides: 379 mg/dL — ABNORMAL HIGH (ref <150)

## 2017-03-02 LAB — URINALYSIS, ROUTINE W REFLEX MICROSCOPIC
Bilirubin Urine: NEGATIVE
HYALINE CAST: NONE SEEN /LPF
Nitrite: POSITIVE — AB
Protein, ur: NEGATIVE
SPECIFIC GRAVITY, URINE: 1.037 — AB (ref 1.001–1.03)
Squamous Epithelial / LPF: NONE SEEN /HPF (ref <=5)
WBC, UA: >=60 /HPF — AB (ref 0–5)

## 2017-03-02 LAB — BASIC METABOLIC PANEL WITH GFR
BUN: 15 mg/dL (ref 7–25)
CALCIUM: 10.4 mg/dL (ref 8.6–10.4)
CHLORIDE: 105 mmol/L (ref 98–110)
CO2: 26 mmol/L (ref 20–32)
Creat: 0.76 mg/dL (ref 0.50–0.99)
GFR, Est African American: 98 mL/min/{1.73_m2} (ref >=60)
GFR, Est Non African American: 85 mL/min/{1.73_m2} (ref >=60)
GLUCOSE: 174 mg/dL — AB (ref 65–99)
POTASSIUM: 4.6 mmol/L (ref 3.5–5.3)
SODIUM: 142 mmol/L (ref 135–146)

## 2017-03-02 LAB — CBC WITH DIFFERENTIAL/PLATELET
BASOS PCT: 0.9 %
Basophils Absolute: 63 cells/uL (ref 0–200)
Eosinophils Absolute: 392 cells/uL (ref 15–500)
Eosinophils Relative: 5.6 %
HCT: 44.6 % (ref 35.0–45.0)
HEMOGLOBIN: 15.1 g/dL (ref 11.7–15.5)
LYMPHS ABS: 2065 {cells}/uL (ref 850–3900)
MCH: 32.1 pg (ref 27.0–33.0)
MCHC: 33.9 g/dL (ref 32.0–36.0)
MCV: 94.9 fL (ref 80.0–100.0)
MPV: 10.7 fL (ref 7.5–12.5)
Monocytes Relative: 5.7 %
NEUTROS ABS: 4081 {cells}/uL (ref 1500–7800)
Neutrophils Relative %: 58.3 %
Platelets: 195 10*3/uL (ref 140–400)
RBC: 4.7 10*6/uL (ref 3.80–5.10)
RDW: 12.6 % (ref 11.0–15.0)
Total Lymphocyte: 29.5 %
WBC: 7 10*3/uL (ref 3.8–10.8)
WBCMIX: 399 {cells}/uL (ref 200–950)

## 2017-03-02 LAB — HEPATIC FUNCTION PANEL
AG Ratio: 1.9 (calc) (ref 1.0–2.5)
ALKALINE PHOSPHATASE (APISO): 82 U/L (ref 33–130)
ALT: 45 U/L — AB (ref 6–29)
AST: 36 U/L — AB (ref 10–35)
Albumin: 4.4 g/dL (ref 3.6–5.1)
BILIRUBIN TOTAL: 0.4 mg/dL (ref 0.2–1.2)
Bilirubin, Direct: 0.1 mg/dL (ref 0.0–0.2)
Globulin: 2.3 g/dL (calc) (ref 1.9–3.7)
Indirect Bilirubin: 0.3 mg/dL (calc) (ref 0.2–1.2)
Total Protein: 6.7 g/dL (ref 6.1–8.1)

## 2017-03-02 LAB — TSH: TSH: 0.67 m[IU]/L (ref 0.40–4.50)

## 2017-03-02 LAB — URIC ACID: Uric Acid, Serum: 7.4 mg/dL — ABNORMAL HIGH (ref 2.5–7.0)

## 2017-03-02 LAB — MAGNESIUM: Magnesium: 1.9 mg/dL (ref 1.5–2.5)

## 2017-03-02 LAB — HEMOGLOBIN A1C
EAG (MMOL/L): 10 (calc)
HEMOGLOBIN A1C: 7.9 %{Hb} — AB (ref <5.7)
Mean Plasma Glucose: 180 (calc)

## 2017-03-02 LAB — MICROALBUMIN / CREATININE URINE RATIO
Creatinine, Urine: 51 mg/dL (ref 20–275)
MICROALB UR: 1.2 mg/dL
MICROALB/CREAT RATIO: 24 ug/mg{creat} (ref <30)

## 2017-03-02 MED ORDER — ALLOPURINOL 100 MG PO TABS: 100.0000 mg | ORAL_TABLET | Freq: Every day | ORAL | 2 refills | Status: DC

## 2017-03-02 MED ORDER — SULFAMETHOXAZOLE-TRIMETHOPRIM 800-160 MG PO TABS: 1.0000 | ORAL_TABLET | Freq: Two times a day (BID) | ORAL | 0 refills | Status: DC

## 2017-03-02 NOTE — Addendum Note (Signed)
Addended by: Eulis Canner on: 03/02/2017 10:31 AM   Modules accepted: Orders

## 2017-03-04 LAB — URINE CULTURE
MICRO NUMBER:: 90091487
SPECIMEN QUALITY: ADEQUATE

## 2017-03-17 ENCOUNTER — Other Ambulatory Visit: Payer: Self-pay

## 2017-03-17 DIAGNOSIS — E119 Type 2 diabetes mellitus without complications: Secondary | ICD-10-CM

## 2017-03-17 MED ORDER — GLIMEPIRIDE 4 MG PO TABS: 4.0000 mg | ORAL_TABLET | Freq: Every day | ORAL | 1 refills | Status: DC

## 2017-04-02 ENCOUNTER — Encounter: Payer: Self-pay | Admitting: Physician Assistant

## 2017-04-02 ENCOUNTER — Other Ambulatory Visit: Payer: Self-pay | Admitting: Physician Assistant

## 2017-04-02 ENCOUNTER — Other Ambulatory Visit: Payer: Self-pay | Admitting: Internal Medicine

## 2017-04-02 MED ORDER — MELOXICAM 7.5 MG PO TABS: ORAL_TABLET | ORAL | 0 refills | Status: DC

## 2017-04-02 MED ORDER — PREGABALIN 75 MG PO CAPS: 75.0000 mg | ORAL_CAPSULE | Freq: Three times a day (TID) | ORAL | 2 refills | Status: DC

## 2017-04-04 MED ORDER — GABAPENTIN 300 MG PO CAPS: 300.0000 mg | ORAL_CAPSULE | Freq: Three times a day (TID) | ORAL | 2 refills | Status: DC

## 2017-04-05 ENCOUNTER — Other Ambulatory Visit: Payer: Self-pay | Admitting: *Deleted

## 2017-04-05 ENCOUNTER — Other Ambulatory Visit: Payer: 59

## 2017-04-05 DIAGNOSIS — M1A9XX0 Chronic gout, unspecified, without tophus (tophi): Secondary | ICD-10-CM

## 2017-04-05 DIAGNOSIS — R350 Frequency of micturition: Secondary | ICD-10-CM

## 2017-04-05 MED ORDER — MELOXICAM 7.5 MG PO TABS: ORAL_TABLET | ORAL | 0 refills | Status: DC

## 2017-04-06 DIAGNOSIS — H40023 Open angle with borderline findings, high risk, bilateral: Secondary | ICD-10-CM | POA: Diagnosis not present

## 2017-04-06 DIAGNOSIS — E119 Type 2 diabetes mellitus without complications: Secondary | ICD-10-CM | POA: Diagnosis not present

## 2017-04-06 DIAGNOSIS — H2513 Age-related nuclear cataract, bilateral: Secondary | ICD-10-CM | POA: Diagnosis not present

## 2017-04-06 LAB — URINALYSIS, ROUTINE W REFLEX MICROSCOPIC
Bilirubin Urine: NEGATIVE
HGB URINE DIPSTICK: NEGATIVE
Ketones, ur: NEGATIVE
Leukocytes, UA: NEGATIVE
NITRITE: NEGATIVE
PROTEIN: NEGATIVE
Specific Gravity, Urine: 1.039 — ABNORMAL HIGH (ref 1.001–1.03)
pH: <=5 (ref 5.0–8.0)

## 2017-04-06 LAB — URINE CULTURE
MICRO NUMBER: 90244698
Result:: NO GROWTH
SPECIMEN QUALITY: ADEQUATE

## 2017-04-06 LAB — URIC ACID: Uric Acid, Serum: 6 mg/dL (ref 2.5–7.0)

## 2017-04-07 ENCOUNTER — Other Ambulatory Visit: Payer: Self-pay | Admitting: Internal Medicine

## 2017-04-08 DIAGNOSIS — M25572 Pain in left ankle and joints of left foot: Secondary | ICD-10-CM | POA: Diagnosis not present

## 2017-04-09 DIAGNOSIS — R262 Difficulty in walking, not elsewhere classified: Secondary | ICD-10-CM | POA: Diagnosis not present

## 2017-04-09 DIAGNOSIS — M25572 Pain in left ankle and joints of left foot: Secondary | ICD-10-CM | POA: Diagnosis not present

## 2017-04-09 DIAGNOSIS — M7662 Achilles tendinitis, left leg: Secondary | ICD-10-CM | POA: Diagnosis not present

## 2017-04-13 DIAGNOSIS — R262 Difficulty in walking, not elsewhere classified: Secondary | ICD-10-CM | POA: Diagnosis not present

## 2017-04-13 DIAGNOSIS — M7662 Achilles tendinitis, left leg: Secondary | ICD-10-CM | POA: Diagnosis not present

## 2017-04-13 DIAGNOSIS — M25572 Pain in left ankle and joints of left foot: Secondary | ICD-10-CM | POA: Diagnosis not present

## 2017-04-20 DIAGNOSIS — M7662 Achilles tendinitis, left leg: Secondary | ICD-10-CM | POA: Diagnosis not present

## 2017-04-20 DIAGNOSIS — R262 Difficulty in walking, not elsewhere classified: Secondary | ICD-10-CM | POA: Diagnosis not present

## 2017-04-20 DIAGNOSIS — M25572 Pain in left ankle and joints of left foot: Secondary | ICD-10-CM | POA: Diagnosis not present

## 2017-04-22 ENCOUNTER — Other Ambulatory Visit: Payer: Self-pay | Admitting: *Deleted

## 2017-04-22 MED ORDER — GLUCOSE BLOOD VI STRP: ORAL_STRIP | 3 refills | Status: DC

## 2017-04-26 ENCOUNTER — Other Ambulatory Visit: Payer: Self-pay

## 2017-04-26 ENCOUNTER — Other Ambulatory Visit: Payer: Self-pay | Admitting: Internal Medicine

## 2017-04-26 MED ORDER — GLUCOSE BLOOD VI STRP: ORAL_STRIP | 3 refills | Status: DC

## 2017-04-29 ENCOUNTER — Other Ambulatory Visit: Payer: Self-pay

## 2017-04-29 MED ORDER — GABAPENTIN 300 MG PO CAPS
300.0000 mg | ORAL_CAPSULE | Freq: Three times a day (TID) | ORAL | 2 refills | Status: DC
Start: 2017-04-29 — End: 2017-05-10

## 2017-05-09 ENCOUNTER — Encounter: Payer: Self-pay | Admitting: Physician Assistant

## 2017-05-10 ENCOUNTER — Other Ambulatory Visit: Payer: Self-pay | Admitting: Physician Assistant

## 2017-05-10 ENCOUNTER — Other Ambulatory Visit: Payer: Self-pay

## 2017-05-10 MED ORDER — EMPAGLIFLOZIN-METFORMIN HCL ER 25-1000 MG PO TB24: 25.0000 mg | ORAL_TABLET | Freq: Every day | ORAL | 3 refills | Status: DC

## 2017-05-10 MED ORDER — SITAGLIPTIN PHOSPHATE 100 MG PO TABS: 100.0000 mg | ORAL_TABLET | Freq: Every day | ORAL | 6 refills | Status: DC

## 2017-05-10 MED ORDER — GLUCOSE BLOOD VI STRP: ORAL_STRIP | 12 refills | Status: DC

## 2017-05-10 MED ORDER — ACCU-CHEK AVIVA PLUS W/DEVICE KIT: PACK | 0 refills | Status: DC

## 2017-05-10 MED ORDER — GABAPENTIN 300 MG PO CAPS
300.0000 mg | ORAL_CAPSULE | Freq: Three times a day (TID) | ORAL | 2 refills | Status: DC
Start: 2017-05-10 — End: 2018-05-09

## 2017-05-10 NOTE — Progress Notes (Signed)
Future Appointments  Date Time Provider [place]  06/04/2017  8:45 AM Vicie Mutters, PA-C [facility] None  03/03/2018  9:00 AM Vicie Mutters, PA-C [facility] None

## 2017-05-23 ENCOUNTER — Other Ambulatory Visit: Payer: Self-pay | Admitting: Physician Assistant

## 2017-05-23 DIAGNOSIS — M1A9XX0 Chronic gout, unspecified, without tophus (tophi): Secondary | ICD-10-CM

## 2017-05-24 DIAGNOSIS — M25572 Pain in left ankle and joints of left foot: Secondary | ICD-10-CM | POA: Diagnosis not present

## 2017-06-02 NOTE — Progress Notes (Signed)
Assessment and Plan:   Essential hypertension - continue medications, DASH diet, exercise and monitor at home. Call if greater than 130/80.  -     BASIC METABOLIC PANEL WITH GFR -     CBC with Differential/Platelet -     Hepatic function panel -     TSH  CKD stage 2 due to type 2 diabetes mellitus ([place]) Discussed general issues about diabetes pathophysiology and management., Educational material distributed., Suggested low cholesterol diet., Encouraged aerobic exercise., Discussed foot care., Reminded to get yearly retinal exam. - may start once a week injectable, will call her insurance -     BASIC METABOLIC PANEL WITH GFR -     Hemoglobin A1c  Hyperlipidemia, unspecified hyperlipidemia type -continue medications, check lipids, decrease fatty foods, increase activity.  -     Lipid panel  Recurrent major depressive disorder, in partial remission ([place]) - continue medications, stress management techniques discussed, increase water, good sleep hygiene discussed, increase exercise, and increase veggies.   Chronic gout without tophus, unspecified cause, unspecified site Gout- recheck Uric acid as needed, Diet discussed, continue medications.  Vitamin D deficiency Continue supplement  Medication management -     Magnesium  Morbid obesity ([place]) - long discussion about weight loss, diet, and exercise -     Lipid panel -     Hemoglobin A1c    Continue diet and meds as discussed. Further disposition pending results of labs. Discussed med's effects and SE's.   HPI 62 y.o. female  presents for 3 month follow up with hypertension, hyperlipidemia, diabetes and vitamin D.  She is having surgery may 8 on bone spur on left foot, out patient, local surgery. Going to beach with all the kids in July and [place]    Her blood pressure has been controlled at home, today their BP is BP: 114/60 She does not workout. She denies chest pain, shortness of breath, dizziness.  She is  not on cholesterol medication and denies myalgias. Her cholesterol is at goal. 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 she is on ASA and she is on ACE/ARB, and having polydipsia, polyuria and denies paresthesia of the feet and visual disturbances. Last A1C  was: 03/01/2017: Hgb A1c MFr Bld 7.9  Lab Results  Component Value Date   GFRNONAA 85 03/01/2017   Has had her hair falling out since jan/Feb 2016, currently in a wig. Patient is on Vitamin D supplement. She was on allopurinol for gout but last time she was not on it and last uric acid was   Lab Results  Component Value Date   LABURIC 6.0 04/05/2017   BMI is Body mass index is 33.41 kg/m., she is working on diet and exercise and has done well. She is not on phentermine at this time.  She has completely stopped sodas, she will still do half/half sweet tea when she is out.  Wt Readings from Last 3 Encounters:  06/04/17 188 lb 9.6 oz (85.5 kg)  03/01/17 185 lb 3.2 oz (84 kg)  10/21/16 192 lb 12.8 oz (87.5 kg)    Current Medications:  Current Outpatient Medications on File Prior to Visit  Medication Sig Dispense Refill  . allopurinol (ZYLOPRIM) 100 MG tablet TAKE 1 TABLET BY MOUTH EVERY DAY 90 tablet 1  . aspirin 81 MG tablet Take 81 mg by mouth daily.    . Blood Glucose Monitoring Suppl (ACCU-CHEK AVIVA PLUS) w/Device KIT Test sugar once daily 11.9 1 kit 0  . Cholecalciferol (VITAMIN D) 2000 UNITS tablet Take 2,000 Units by mouth 2 (two) times daily.    . Empagliflozin-metFORMIN HCl ER (SYNJARDY XR) 25-1000 MG TB24 Take 25 mg by mouth daily. 30 tablet 3  . gabapentin (NEURONTIN)  300 MG capsule Take 1 capsule (300 mg total) by mouth 3 (three) times daily. 270 capsule 2  . glimepiride (AMARYL) 4 MG tablet Take 1 tablet (4 mg total) by mouth daily with breakfast. 90 tablet 1  . glucose blood (ACCU-CHEK AVIVA PLUS) test strip Test sugar once daily DX 11.9 50 each 12  . lisinopril (PRINIVIL,ZESTRIL) 10 MG tablet TAKE 1 TABLET EVERY DAY 90 tablet 1  . Multiple Vitamin (MULTIVITAMIN) capsule Take 1 capsule by mouth daily.    . Omega-3 Fatty Acids (FISH OIL) 1000 MG CAPS Take by mouth daily.    . phentermine (ADIPEX-P) 37.5 MG tablet Take 1 tablet (37.5 mg total) by mouth daily before breakfast. 30 tablet 1  . sitaGLIPtin (JANUVIA) 100 MG tablet Take 1 tablet (100 mg total) by mouth daily. 30 tablet 6  . Colchicine (MITIGARE) 0.6 MG CAPS 1 tablet daily for gout (Patient not taking: Reported on 06/04/2017) 30 capsule 0   No current facility-administered medications on file prior to visit.    Medical History:  Past Medical History:  Diagnosis Date  . Anemia   . Anxiety   . Colon polyps   . Diabetes mellitus without complication ([place])   . Gout   . Hypertriglyceridemia   . Obstructive sleep apnea on CPAP    uses CPAP nightly  . 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 Clinoril [Sulindac]   . Erythromycin   . Shrimp [Shellfish Allergy]     Review of Systems  Constitutional: Negative.   HENT: Negative.   Eyes: Negative.   Respiratory: Negative.   Cardiovascular: Negative for chest pain, palpitations, orthopnea, claudication, leg swelling and PND.  Gastrointestinal: Negative.   Genitourinary: Negative for dysuria, flank pain, frequency, hematuria and urgency.       Stress incontinence  Musculoskeletal: Negative.   Skin: Negative.  Negative for itching.       Hair loss  Neurological: Negative for dizziness, tingling, tremors, sensory change, speech change, focal weakness, seizures, loss of  consciousness and headaches.  Endo/Heme/Allergies: Negative for environmental allergies and polydipsia. Does not bruise/bleed easily.  Psychiatric/Behavioral: Negative.      Family history- Review and unchanged Social history- Review and unchanged Physical Exam: BP 114/60   Pulse 95   Temp 97.6 F (36.4 C)   Resp 18   Ht 5' 3"  (1.6 m)   Wt 188 lb 9.6 oz (85.5 kg)   SpO2 98%   BMI 33.41 kg/m  Wt Readings from Last 3 Encounters:  06/04/17 188 lb 9.6 oz (85.5 kg)  03/01/17 185 lb 3.2 oz (84 kg)  10/21/16 192 lb 12.8 oz (87.5 kg)   General Appearance: Well nourished, in no apparent distress. Eyes: PERRLA, EOMs, conjunctiva no swelling or erythema Sinuses: No Frontal/maxillary tenderness ENT/Mouth: Ext aud canals clear, TMs without erythema, bulging. No  erythema, swelling, or exudate on post pharynx.  Tonsils not swollen or erythematous. Hearing normal.  Neck: Supple, thyroid normal.  Respiratory: Respiratory effort normal, BS equal bilaterally without rales, rhonchi, wheezing or stridor.  Cardio: RRR with no MRGs. Brisk peripheral pulses without edema.  Abdomen: Soft, + BS.  Non tender, no guarding, rebound, hernias, masses. Lymphatics: Non tender without lymphadenopathy.  Musculoskeletal: Full ROM, 5/5 strength, normal gait.  Skin: Crown hair thinning with mild follicular erythema. Warm, dry without rashes, lesions, ecchymosis.  Neuro: Cranial nerves intact. No cerebellar symptoms. Sensation intact.  Psych: Awake and oriented X 3, normal affect, Insight and Judgment appropriate.    Vicie Mutters, PA-C 9:02 AM [facility]

## 2017-06-04 ENCOUNTER — Ambulatory Visit (INDEPENDENT_AMBULATORY_CARE_PROVIDER_SITE_OTHER): Payer: 59 | Admitting: Physician Assistant

## 2017-06-04 ENCOUNTER — Encounter: Payer: Self-pay | Admitting: Physician Assistant

## 2017-06-04 VITALS — BP 114/60 | HR 95 | Temp 97.6°F | Resp 18 | Ht 63.0 in | Wt 188.6 lb

## 2017-06-04 DIAGNOSIS — E1122 Type 2 diabetes mellitus with diabetic chronic kidney disease: Secondary | ICD-10-CM | POA: Diagnosis not present

## 2017-06-04 DIAGNOSIS — E785 Hyperlipidemia, unspecified: Secondary | ICD-10-CM

## 2017-06-04 DIAGNOSIS — E1141 Type 2 diabetes mellitus with diabetic mononeuropathy: Secondary | ICD-10-CM

## 2017-06-04 DIAGNOSIS — N182 Chronic kidney disease, stage 2 (mild): Secondary | ICD-10-CM

## 2017-06-04 DIAGNOSIS — I10 Essential (primary) hypertension: Secondary | ICD-10-CM

## 2017-06-04 DIAGNOSIS — G4733 Obstructive sleep apnea (adult) (pediatric): Secondary | ICD-10-CM | POA: Diagnosis not present

## 2017-06-04 MED ORDER — METFORMIN HCL ER 500 MG PO TB24: ORAL_TABLET | ORAL | 2 refills | Status: DC

## 2017-06-04 MED ORDER — PHENTERMINE HCL 37.5 MG PO TABS: 37.5000 mg | ORAL_TABLET | Freq: Every day | ORAL | 2 refills | Status: DC

## 2017-06-04 MED ORDER — EMPAGLIFLOZIN-METFORMIN HCL ER 25-1000 MG PO TB24: 25.0000 mg | ORAL_TABLET | Freq: Every day | ORAL | 3 refills | Status: DC

## 2017-06-04 NOTE — Addendum Note (Signed)
Addended by: Elsie Amis D on: 06/04/2017 09:32 AM   Modules accepted: Orders

## 2017-06-04 NOTE — Patient Instructions (Addendum)
Can add stevia to unsweet tea Can try zero water  Check out  Mini habits for weight loss book  2 apps for tracking food is myfitness pal  loseit OR can take picture of your food  Diet soda leads to weight gain.  We recently discovered that the artifical sugar in the soda stops an enzyme in your stomach that is suppose to signal that your brain is full. So patients that drink a lot of diet soda will never feel full and tend to over eat. So please cut back on diet soda and it can help with your weight loss.   Veggies are great because you can eat a ton! They are low in calories, great to fill you up, and have a ton of vitamins, minerals, and protein.         When it comes to diets, agreement about the perfect plan isn't easy to find, even among the experts. Experts at the [employer] developed an idea known as the Healthy Eating Plate. Just imagine a plate divided into logical, healthy portions.  The emphasis is on diet quality:  Load up on vegetables and fruits - one-half of your plate: Aim for color and variety, and remember that potatoes don't count.  Go for whole grains - one-quarter of your plate: Whole wheat, barley, wheat berries, quinoa, oats, brown rice, and foods made with them. If you want pasta, go with whole wheat pasta.  Protein power - one-quarter of your plate: Fish, chicken, beans, and nuts are all healthy, versatile protein sources. Limit red meat.  The diet, however, does go beyond the plate, offering a few other suggestions.  Use healthy plant oils, such as olive, canola, soy, corn, sunflower and peanut. Check the labels, and avoid partially hydrogenated oil, which have unhealthy trans fats.  If you're thirsty, drink water. Coffee and tea are good in moderation, but skip sugary drinks and limit milk and dairy products to one or two daily servings.  The type of carbohydrate in the diet is more important than the amount. Some sources of  carbohydrates, such as vegetables, fruits, whole grains, and beans-are healthier than others.  Finally, stay active.

## 2017-06-07 LAB — COMPLETE METABOLIC PANEL WITH GFR
AG RATIO: 2.1 (calc) (ref 1.0–2.5)
ALBUMIN MSPROF: 4.5 g/dL (ref 3.6–5.1)
ALKALINE PHOSPHATASE (APISO): 80 U/L (ref 33–130)
ALT: 41 U/L — ABNORMAL HIGH (ref 6–29)
AST: 25 U/L (ref 10–35)
BILIRUBIN TOTAL: 0.5 mg/dL (ref 0.2–1.2)
BUN: 20 mg/dL (ref 7–25)
CO2: 24 mmol/L (ref 20–32)
CREATININE: 0.69 mg/dL (ref 0.50–0.99)
Calcium: 9.9 mg/dL (ref 8.6–10.4)
Chloride: 106 mmol/L (ref 98–110)
GFR, Est African American: 109 mL/min/{1.73_m2} (ref >=60)
GFR, Est Non African American: 94 mL/min/{1.73_m2} (ref >=60)
GLOBULIN: 2.1 g/dL (ref 1.9–3.7)
Glucose, Bld: 187 mg/dL — ABNORMAL HIGH (ref 65–99)
Potassium: 4.7 mmol/L (ref 3.5–5.3)
SODIUM: 141 mmol/L (ref 135–146)
Total Protein: 6.6 g/dL (ref 6.1–8.1)

## 2017-06-07 LAB — HEMOGLOBIN A1C
HEMOGLOBIN A1C: 7.9 %{Hb} — AB (ref <5.7)
MEAN PLASMA GLUCOSE: 180 (calc)
eAG (mmol/L): 10 (calc)

## 2017-06-07 LAB — CBC WITH DIFFERENTIAL/PLATELET
BASOS ABS: 67 {cells}/uL (ref 0–200)
Basophils Relative: 1.1 %
Eosinophils Absolute: 207 cells/uL (ref 15–500)
Eosinophils Relative: 3.4 %
HEMATOCRIT: 42.4 % (ref 35.0–45.0)
HEMOGLOBIN: 14.9 g/dL (ref 11.7–15.5)
Lymphs Abs: 1964 cells/uL (ref 850–3900)
MCH: 33 pg (ref 27.0–33.0)
MCHC: 35.1 g/dL (ref 32.0–36.0)
MCV: 94 fL (ref 80.0–100.0)
MPV: 11.1 fL (ref 7.5–12.5)
Monocytes Relative: 5.6 %
NEUTROS ABS: 3520 {cells}/uL (ref 1500–7800)
Neutrophils Relative %: 57.7 %
Platelets: 201 10*3/uL (ref 140–400)
RBC: 4.51 10*6/uL (ref 3.80–5.10)
RDW: 12.6 % (ref 11.0–15.0)
Total Lymphocyte: 32.2 %
WBC mixed population: 342 cells/uL (ref 200–950)
WBC: 6.1 10*3/uL (ref 3.8–10.8)

## 2017-06-07 LAB — TSH: TSH: 0.49 m[IU]/L (ref 0.40–4.50)

## 2017-06-07 LAB — LIPID PANEL
Cholesterol: 143 mg/dL (ref <200)
HDL: 34 mg/dL — ABNORMAL LOW (ref >50)
LDL CHOLESTEROL (CALC): 62 mg/dL
Non-HDL Cholesterol (Calc): 109 mg/dL (calc) (ref <130)
Total CHOL/HDL Ratio: 4.2 (calc) (ref <5.0)
Triglycerides: 389 mg/dL — ABNORMAL HIGH (ref <150)

## 2017-06-14 ENCOUNTER — Telehealth: Payer: Self-pay | Admitting: Internal Medicine

## 2017-06-14 NOTE — Telephone Encounter (Signed)
Tonya [facility] called and Requests most recent labs, note, and ekg for upcoming surgery at the [facility]. Faxed to [phone]. Ph [phone].

## 2017-06-16 DIAGNOSIS — G8918 Other acute postprocedural pain: Secondary | ICD-10-CM | POA: Diagnosis not present

## 2017-06-16 DIAGNOSIS — M65272 Calcific tendinitis, left ankle and foot: Secondary | ICD-10-CM | POA: Diagnosis not present

## 2017-06-16 DIAGNOSIS — M66362 Spontaneous rupture of flexor tendons, left lower leg: Secondary | ICD-10-CM | POA: Diagnosis not present

## 2017-06-16 DIAGNOSIS — M9262 Juvenile osteochondrosis of tarsus, left ankle: Secondary | ICD-10-CM | POA: Diagnosis not present

## 2017-06-16 DIAGNOSIS — D1632 Benign neoplasm of short bones of left lower limb: Secondary | ICD-10-CM | POA: Diagnosis not present

## 2017-06-16 HISTORY — PX: FOOT SURGERY: SHX648

## 2017-06-28 DIAGNOSIS — M66362 Spontaneous rupture of flexor tendons, left lower leg: Secondary | ICD-10-CM | POA: Diagnosis not present

## 2017-07-06 DIAGNOSIS — M66362 Spontaneous rupture of flexor tendons, left lower leg: Secondary | ICD-10-CM | POA: Diagnosis not present

## 2017-07-27 ENCOUNTER — Other Ambulatory Visit: Payer: Self-pay | Admitting: Physician Assistant

## 2017-07-30 ENCOUNTER — Other Ambulatory Visit: Payer: Self-pay

## 2017-07-30 DIAGNOSIS — E1141 Type 2 diabetes mellitus with diabetic mononeuropathy: Secondary | ICD-10-CM

## 2017-07-30 MED ORDER — EMPAGLIFLOZIN-METFORMIN HCL ER 25-1000 MG PO TB24: 25.0000 mg | ORAL_TABLET | Freq: Every day | ORAL | 1 refills | Status: DC

## 2017-08-03 DIAGNOSIS — M66362 Spontaneous rupture of flexor tendons, left lower leg: Secondary | ICD-10-CM | POA: Diagnosis not present

## 2017-08-04 ENCOUNTER — Other Ambulatory Visit: Payer: Self-pay | Admitting: Internal Medicine

## 2017-08-05 DIAGNOSIS — M25572 Pain in left ankle and joints of left foot: Secondary | ICD-10-CM | POA: Diagnosis not present

## 2017-08-05 DIAGNOSIS — R262 Difficulty in walking, not elsewhere classified: Secondary | ICD-10-CM | POA: Diagnosis not present

## 2017-08-05 DIAGNOSIS — M7662 Achilles tendinitis, left leg: Secondary | ICD-10-CM | POA: Diagnosis not present

## 2017-08-10 DIAGNOSIS — R262 Difficulty in walking, not elsewhere classified: Secondary | ICD-10-CM | POA: Diagnosis not present

## 2017-08-10 DIAGNOSIS — M7662 Achilles tendinitis, left leg: Secondary | ICD-10-CM | POA: Diagnosis not present

## 2017-08-10 DIAGNOSIS — M25572 Pain in left ankle and joints of left foot: Secondary | ICD-10-CM | POA: Diagnosis not present

## 2017-08-16 DIAGNOSIS — R928 Other abnormal and inconclusive findings on diagnostic imaging of breast: Secondary | ICD-10-CM | POA: Diagnosis not present

## 2017-08-16 LAB — HM MAMMOGRAPHY

## 2017-08-17 DIAGNOSIS — M25572 Pain in left ankle and joints of left foot: Secondary | ICD-10-CM | POA: Diagnosis not present

## 2017-08-17 DIAGNOSIS — M7662 Achilles tendinitis, left leg: Secondary | ICD-10-CM | POA: Diagnosis not present

## 2017-08-17 DIAGNOSIS — R262 Difficulty in walking, not elsewhere classified: Secondary | ICD-10-CM | POA: Diagnosis not present

## 2017-08-19 DIAGNOSIS — R262 Difficulty in walking, not elsewhere classified: Secondary | ICD-10-CM | POA: Diagnosis not present

## 2017-08-19 DIAGNOSIS — M7662 Achilles tendinitis, left leg: Secondary | ICD-10-CM | POA: Diagnosis not present

## 2017-08-19 DIAGNOSIS — M25572 Pain in left ankle and joints of left foot: Secondary | ICD-10-CM | POA: Diagnosis not present

## 2017-08-24 DIAGNOSIS — R262 Difficulty in walking, not elsewhere classified: Secondary | ICD-10-CM | POA: Diagnosis not present

## 2017-08-24 DIAGNOSIS — M25572 Pain in left ankle and joints of left foot: Secondary | ICD-10-CM | POA: Diagnosis not present

## 2017-08-24 DIAGNOSIS — M7662 Achilles tendinitis, left leg: Secondary | ICD-10-CM | POA: Diagnosis not present

## 2017-08-26 DIAGNOSIS — M7662 Achilles tendinitis, left leg: Secondary | ICD-10-CM | POA: Diagnosis not present

## 2017-08-26 DIAGNOSIS — R262 Difficulty in walking, not elsewhere classified: Secondary | ICD-10-CM | POA: Diagnosis not present

## 2017-08-26 DIAGNOSIS — M25572 Pain in left ankle and joints of left foot: Secondary | ICD-10-CM | POA: Diagnosis not present

## 2017-08-31 DIAGNOSIS — M25572 Pain in left ankle and joints of left foot: Secondary | ICD-10-CM | POA: Diagnosis not present

## 2017-08-31 DIAGNOSIS — M7662 Achilles tendinitis, left leg: Secondary | ICD-10-CM | POA: Diagnosis not present

## 2017-08-31 DIAGNOSIS — R262 Difficulty in walking, not elsewhere classified: Secondary | ICD-10-CM | POA: Diagnosis not present

## 2017-09-02 DIAGNOSIS — R262 Difficulty in walking, not elsewhere classified: Secondary | ICD-10-CM | POA: Diagnosis not present

## 2017-09-02 DIAGNOSIS — M7662 Achilles tendinitis, left leg: Secondary | ICD-10-CM | POA: Diagnosis not present

## 2017-09-02 DIAGNOSIS — M25572 Pain in left ankle and joints of left foot: Secondary | ICD-10-CM | POA: Diagnosis not present

## 2017-09-03 DIAGNOSIS — G4733 Obstructive sleep apnea (adult) (pediatric): Secondary | ICD-10-CM | POA: Diagnosis not present

## 2017-09-09 ENCOUNTER — Encounter: Payer: Self-pay | Admitting: *Deleted

## 2017-09-13 DIAGNOSIS — R262 Difficulty in walking, not elsewhere classified: Secondary | ICD-10-CM | POA: Diagnosis not present

## 2017-09-13 DIAGNOSIS — M7662 Achilles tendinitis, left leg: Secondary | ICD-10-CM | POA: Diagnosis not present

## 2017-09-13 DIAGNOSIS — M25572 Pain in left ankle and joints of left foot: Secondary | ICD-10-CM | POA: Diagnosis not present

## 2017-09-14 DIAGNOSIS — M25572 Pain in left ankle and joints of left foot: Secondary | ICD-10-CM | POA: Diagnosis not present

## 2017-09-16 DIAGNOSIS — M7662 Achilles tendinitis, left leg: Secondary | ICD-10-CM | POA: Diagnosis not present

## 2017-09-16 DIAGNOSIS — M25572 Pain in left ankle and joints of left foot: Secondary | ICD-10-CM | POA: Diagnosis not present

## 2017-09-16 DIAGNOSIS — R262 Difficulty in walking, not elsewhere classified: Secondary | ICD-10-CM | POA: Diagnosis not present

## 2017-09-21 DIAGNOSIS — M25572 Pain in left ankle and joints of left foot: Secondary | ICD-10-CM | POA: Diagnosis not present

## 2017-09-21 DIAGNOSIS — R262 Difficulty in walking, not elsewhere classified: Secondary | ICD-10-CM | POA: Diagnosis not present

## 2017-09-21 DIAGNOSIS — M7662 Achilles tendinitis, left leg: Secondary | ICD-10-CM | POA: Diagnosis not present

## 2017-09-23 ENCOUNTER — Ambulatory Visit: Payer: Self-pay | Admitting: Adult Health

## 2017-09-23 DIAGNOSIS — M7662 Achilles tendinitis, left leg: Secondary | ICD-10-CM | POA: Diagnosis not present

## 2017-09-23 DIAGNOSIS — R262 Difficulty in walking, not elsewhere classified: Secondary | ICD-10-CM | POA: Diagnosis not present

## 2017-09-23 DIAGNOSIS — M25572 Pain in left ankle and joints of left foot: Secondary | ICD-10-CM | POA: Diagnosis not present

## 2017-09-23 NOTE — Progress Notes (Signed)
FOLLOW UP  Assessment and Plan:   Hypertension Well controlled with current medications  Monitor blood pressure at home; patient to call if consistently greater than 130/80 Continue DASH diet.   Reminder to go to the ER if any CP, SOB, nausea, dizziness, severe HA, changes vision/speech, left arm numbness and tingling and jaw pain.  Cholesterol Currently at LDL goal; trigs remain elevated discussed will start fenofibrate if over 500 Continue low cholesterol diet and exercise.  Check lipid panel.   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  Perform daily foot/skin check, notify office of any concerning changes.  Check A1C  Obesity with co morbidities Long discussion about weight loss, diet, and exercise Recommended diet heavy in fruits and veggies and low in animal meats, cheeses, and dairy products, appropriate calorie intake Discussed ideal weight for height  Patient to restart phentermine, reminded to take frequent drug breaks Cut out soda; switch to stevia sweetened Will follow up in 3 months  Vitamin D Def At goal at last visit; continue supplementation Defer Vit D level  Gout Continue allopurinol Diet discussed Check uric acid as needed  Continue diet and meds as discussed. Further disposition pending results of labs. Discussed med's effects and SE's.   Over 30 minutes of exam, counseling, chart review, and critical decision making was performed.   Future Appointments  Date Time Provider [place]  03/03/2018  9:00 AM Vicie Mutters, PA-C [id number] None    ----------------------------------------------------------------------------------------------------------------------  HPI 62 y.o. female  presents for 3 month follow up on  hypertension, cholesterol, diabetes, obesity, gout and vitamin D deficiency. She is recovering from right foot bone spur surgery with Dr. Noemi Chapel.   she is prescribed phentermine for weight loss but reports she hasn't started taking due to the surgery.  While on the medication they have lost 0 lbs since last visit. They deny palpitations, anxiety, trouble sleeping, elevated BP.   BMI is Body mass index is 33.13 kg/m., she is working on diet and exercise, starting to get back to the gym. She plans to restart after cruise next week to [place]. She has been eating more at home, cutting down on fast food.  Wt Readings from Last 3 Encounters:  09/24/17 187 lb (84.8 kg)  06/04/17 188 lb 9.6 oz (85.5 kg)  03/01/17 185 lb 3.2 oz (84 kg)   Her blood pressure has been controlled at home, today their BP is BP: 110/64  She does workout. She denies chest pain, shortness of breath, dizziness.   She is not on cholesterol medication due to excellent control with fish oil supplement. Her LDL cholesterol is at goal. Trigs remain elevated. She does admit to some soda intake recently. The cholesterol last visit was:    Lab Results  Component Value Date   CHOL 143 06/04/2017   HDL 34 (L) 06/04/2017   LDLCALC 62 06/04/2017   TRIG 389 (H) 06/04/2017   CHOLHDL 4.2 06/04/2017    She has been working on diet and exercise for T2 diabetes (on januvia, synjardy + metformin, amaryl), and denies foot ulcerations, increased appetite, nausea, paresthesia of the feet, visual disturbances, vomiting and weight loss. She does admit to polydipsia and polyuria. Her fasting sugars are running high since surgery; 200-260. Last A1C in the office was:  Lab Results  Component Value Date  HGBA1C 7.9 (H) 06/04/2017   Patient is on Vitamin D supplement.   Lab Results  Component Value Date   VD25OH 57 01/07/2016     Patient is on allopurinol for gout and does not report a recent flare.  Lab Results  Component Value Date    LABURIC 6.0 04/05/2017     Current Medications:  Current Outpatient Medications on File Prior to Visit  Medication Sig  . allopurinol (ZYLOPRIM) 100 MG tablet TAKE 1 TABLET BY MOUTH EVERY DAY  . aspirin 81 MG tablet Take 81 mg by mouth daily.  . Blood Glucose Monitoring Suppl (ACCU-CHEK AVIVA PLUS) w/Device KIT Test sugar once daily 11.9  . Cholecalciferol (VITAMIN D) 2000 UNITS tablet Take 2,000 Units by mouth 2 (two) times daily.  . Empagliflozin-metFORMIN HCl ER (SYNJARDY XR) 25-1000 MG TB24 Take 25 mg by mouth daily.  Marland Kitchen gabapentin (NEURONTIN) 300 MG capsule Take 1 capsule (300 mg total) by mouth 3 (three) times daily.  Marland Kitchen glimepiride (AMARYL) 4 MG tablet Take 1 tablet (4 mg total) by mouth daily with breakfast.  . glucose blood (ACCU-CHEK AVIVA PLUS) test strip Test sugar once daily DX 11.9  . lisinopril (PRINIVIL,ZESTRIL) 10 MG tablet TAKE 1 TABLET BY MOUTH EVERY DAY  . meloxicam (MOBIC) 15 MG tablet TAKE 1/2 TABLET TWICE A DAY WITH FOOD FOR PAIN AND SWELLING  . metFORMIN (GLUCOPHAGE XR) 500 MG 24 hr tablet 1 tablet PO BID with largest meals.  . Multiple Vitamin (MULTIVITAMIN) capsule Take 1 capsule by mouth daily.  . Omega-3 Fatty Acids (FISH OIL) 1000 MG CAPS Take by mouth daily.  . sitaGLIPtin (JANUVIA) 100 MG tablet Take 1 tablet (100 mg total) by mouth daily.  . phentermine (ADIPEX-P) 37.5 MG tablet Take 1 tablet (37.5 mg total) by mouth daily before breakfast. (Patient not taking: Reported on 09/24/2017)   No current facility-administered medications on file prior to visit.      Allergies:  Allergies  Allergen Reactions  . Clinoril [Sulindac]   . Erythromycin   . Shrimp [Shellfish Allergy]      Medical History:  Past Medical History:  Diagnosis Date  . Anemia   . Anxiety   . Colon polyps   . Diabetes mellitus without complication ([place])   . Gout   . Hypertriglyceridemia   . Obstructive sleep apnea on CPAP    uses CPAP nightly  . 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  HENT: Negative for hearing loss and tinnitus.   Eyes: Negative for blurred vision and double vision.  Respiratory: Negative for cough, shortness of breath and wheezing.   Cardiovascular: Negative for chest pain, palpitations, orthopnea, claudication and leg swelling.  Gastrointestinal: Negative for abdominal pain, blood in stool, constipation, diarrhea, heartburn, melena, nausea and vomiting.  Genitourinary: Negative.   Musculoskeletal: Positive for joint pain (foot after surgery, improved). Negative for myalgias.  Skin: Negative for rash.  Neurological: Negative for dizziness, tingling, sensory change, weakness and headaches.  Endo/Heme/Allergies: Negative for polydipsia.  Psychiatric/Behavioral: Negative.   All other systems reviewed and are negative.     Physical Exam: BP 110/64   Pulse 97   Temp 97.9 F (36.6 C)   Ht 5' 3"  (1.6 m)   Wt 187 lb (84.8 kg)   SpO2 95%   BMI 33.13 kg/m  Wt Readings from  Last 3 Encounters:  09/24/17 187 lb (84.8 kg)  06/04/17 188 lb 9.6 oz (85.5 kg)  03/01/17 185 lb 3.2 oz (84 kg)   General Appearance: Well nourished, in no apparent distress. Eyes: PERRLA, EOMs, conjunctiva no swelling or erythema Sinuses: No Frontal/maxillary tenderness ENT/Mouth: Ext aud canals clear, TMs without erythema, bulging. No erythema, swelling, or exudate on post pharynx.  Tonsils not swollen or erythematous. Hearing normal.  Neck: Supple, thyroid normal.  Respiratory: Respiratory effort normal, BS equal bilaterally without rales, rhonchi, wheezing or stridor.  Cardio: RRR with no MRGs. Brisk peripheral pulses without edema.  Abdomen: Soft, + BS.  Non tender, no guarding, rebound, hernias, masses. Lymphatics: Non tender without lymphadenopathy.   Musculoskeletal: Full ROM, 5/5 strength, Normal gait Skin: Warm, dry without rashes, lesions, ecchymosis.  Neuro: Cranial nerves intact. No cerebellar symptoms.  Psych: Awake and oriented X 3, normal affect, Insight and Judgment appropriate.    Izora Ribas, NP 11:00 AM [facility]

## 2017-09-24 ENCOUNTER — Encounter: Payer: Self-pay | Admitting: Adult Health

## 2017-09-24 ENCOUNTER — Ambulatory Visit (INDEPENDENT_AMBULATORY_CARE_PROVIDER_SITE_OTHER): Payer: 59 | Admitting: Adult Health

## 2017-09-24 VITALS — BP 110/64 | HR 97 | Temp 97.9°F | Ht 63.0 in | Wt 187.0 lb

## 2017-09-24 DIAGNOSIS — E1122 Type 2 diabetes mellitus with diabetic chronic kidney disease: Secondary | ICD-10-CM

## 2017-09-24 DIAGNOSIS — N182 Chronic kidney disease, stage 2 (mild): Secondary | ICD-10-CM

## 2017-09-24 DIAGNOSIS — E559 Vitamin D deficiency, unspecified: Secondary | ICD-10-CM

## 2017-09-24 DIAGNOSIS — I10 Essential (primary) hypertension: Secondary | ICD-10-CM

## 2017-09-24 DIAGNOSIS — M1A9XX0 Chronic gout, unspecified, without tophus (tophi): Secondary | ICD-10-CM

## 2017-09-24 DIAGNOSIS — Z79899 Other long term (current) drug therapy: Secondary | ICD-10-CM

## 2017-09-24 DIAGNOSIS — E1141 Type 2 diabetes mellitus with diabetic mononeuropathy: Secondary | ICD-10-CM | POA: Diagnosis not present

## 2017-09-24 DIAGNOSIS — E785 Hyperlipidemia, unspecified: Secondary | ICD-10-CM

## 2017-09-24 DIAGNOSIS — E669 Obesity, unspecified: Secondary | ICD-10-CM

## 2017-09-24 NOTE — Patient Instructions (Addendum)
Stevia/erythrytol sweetened beverages  Add amaryl 1/2 tab in evenings until sugars improve, and increase metformin to 2 tabs in the evening -    Know what a healthy weight is for you (roughly BMI <25) and aim to maintain this  Aim for 7+ servings of fruits and vegetables daily  65-80+ fluid ounces of water or unsweet tea for healthy kidneys  Limit to max 1 drink of alcohol per day; avoid smoking/tobacco  Limit animal fats in diet for cholesterol and heart health - choose grass fed whenever available  Avoid highly processed foods, and foods high in saturated/trans fats  Aim for low stress - take time to unwind and care for your mental health  Aim for 150 min of moderate intensity exercise weekly for heart health, and weights twice weekly for bone health  Aim for 7-9 hours of sleep daily    Drink 1/2 your body weight in fluid ounces of water daily; drink a tall glass of water 30 min before meals  Don't eat until you're stuffed- listen to your stomach and eat until you are 80% full   Try eating off of a salad plate; wait 10 min after finishing before going back for seconds  Start by eating the vegetables on your plate; aim for 50% of your meals to be fruits or vegetables  Then eat your protein - lean meats (grass fed if possible), fish, beans, nuts in moderation  Eat your carbs/starch last ONLY if you still are hungry. If you can, stop before finishing it all  Avoid sugar and flour - the closer it looks to it's original form in nature, typically the better it is for you  Splurge in moderation - "assign" days when you get to splurge and have the "bad stuff" - I like to follow a 80% - 20% plan- "good" choices 80 % of the time, "bad" choices in moderation 20% of the time  Simple equation is: Calories out > calories in = weight loss - even if you eat the bad stuff, if you limit portions, you will still lose weight       When it comes to diets, agreement about the perfect plan  isn't easy to find, even among the experts. Experts at the [employer] developed an idea known as the Healthy Eating Plate. Just imagine a plate divided into logical, healthy portions.  The emphasis is on diet quality:  Load up on vegetables and fruits - one-half of your plate: Aim for color and variety, and remember that potatoes don't count.  Go for whole grains - one-quarter of your plate: Whole wheat, barley, wheat berries, quinoa, oats, brown rice, and foods made with them. If you want pasta, go with whole wheat pasta.  Protein power - one-quarter of your plate: Fish, chicken, beans, and nuts are all healthy, versatile protein sources. Limit red meat.  The diet, however, does go beyond the plate, offering a few other suggestions.  Use healthy plant oils, such as olive, canola, soy, corn, sunflower and peanut. Check the labels, and avoid partially hydrogenated oil, which have unhealthy trans fats.  If you're thirsty, drink water. Coffee and tea are good in moderation, but skip sugary drinks and limit milk and dairy products to one or two daily servings.  The type of carbohydrate in the diet is more important than the amount. Some sources of carbohydrates, such as vegetables, fruits, whole grains, and beans-are healthier than others.  Finally, stay active.

## 2017-09-25 LAB — COMPLETE METABOLIC PANEL WITH GFR
AG Ratio: 2 (calc) (ref 1.0–2.5)
ALT: 39 U/L — AB (ref 6–29)
AST: 30 U/L (ref 10–35)
Albumin: 4.5 g/dL (ref 3.6–5.1)
Alkaline phosphatase (APISO): 122 U/L (ref 33–130)
BUN: 14 mg/dL (ref 7–25)
CALCIUM: 10.2 mg/dL (ref 8.6–10.4)
CO2: 26 mmol/L (ref 20–32)
CREATININE: 0.89 mg/dL (ref 0.50–0.99)
Chloride: 104 mmol/L (ref 98–110)
GFR, Est African American: 81 mL/min/{1.73_m2} (ref >=60)
GFR, Est Non African American: 69 mL/min/{1.73_m2} (ref >=60)
Globulin: 2.2 g/dL (calc) (ref 1.9–3.7)
Glucose, Bld: 223 mg/dL — ABNORMAL HIGH (ref 65–99)
Potassium: 4.6 mmol/L (ref 3.5–5.3)
Sodium: 142 mmol/L (ref 135–146)
Total Bilirubin: 0.3 mg/dL (ref 0.2–1.2)
Total Protein: 6.7 g/dL (ref 6.1–8.1)

## 2017-09-25 LAB — CBC WITH DIFFERENTIAL/PLATELET
BASOS ABS: 69 {cells}/uL (ref 0–200)
BASOS PCT: 0.9 %
EOS PCT: 7.9 %
Eosinophils Absolute: 608 cells/uL — ABNORMAL HIGH (ref 15–500)
HCT: 43.8 % (ref 35.0–45.0)
HEMOGLOBIN: 14.8 g/dL (ref 11.7–15.5)
Lymphs Abs: 2002 cells/uL (ref 850–3900)
MCH: 32.5 pg (ref 27.0–33.0)
MCHC: 33.8 g/dL (ref 32.0–36.0)
MCV: 96.1 fL (ref 80.0–100.0)
MONOS PCT: 6 %
MPV: 11 fL (ref 7.5–12.5)
Neutro Abs: 4558 cells/uL (ref 1500–7800)
Neutrophils Relative %: 59.2 %
PLATELETS: 210 10*3/uL (ref 140–400)
RBC: 4.56 10*6/uL (ref 3.80–5.10)
RDW: 12.9 % (ref 11.0–15.0)
Total Lymphocyte: 26 %
WBC mixed population: 462 cells/uL (ref 200–950)
WBC: 7.7 10*3/uL (ref 3.8–10.8)

## 2017-09-25 LAB — LIPID PANEL
CHOL/HDL RATIO: 4.5 (calc) (ref <5.0)
CHOLESTEROL: 149 mg/dL (ref <200)
HDL: 33 mg/dL — AB (ref >50)
LDL CHOLESTEROL (CALC): 69 mg/dL
Non-HDL Cholesterol (Calc): 116 mg/dL (calc) (ref <130)
Triglycerides: 394 mg/dL — ABNORMAL HIGH (ref <150)

## 2017-09-25 LAB — TSH: TSH: 0.53 mIU/L (ref 0.40–4.50)

## 2017-09-25 LAB — HEMOGLOBIN A1C
Hgb A1c MFr Bld: 8.7 % of total Hgb — ABNORMAL HIGH (ref <5.7)
Mean Plasma Glucose: 203 (calc)
eAG (mmol/L): 11.2 (calc)

## 2017-09-25 LAB — MAGNESIUM: Magnesium: 2 mg/dL (ref 1.5–2.5)

## 2017-09-26 ENCOUNTER — Encounter: Payer: Self-pay | Admitting: Adult Health

## 2017-09-26 DIAGNOSIS — R945 Abnormal results of liver function studies: Secondary | ICD-10-CM

## 2017-09-27 DIAGNOSIS — R262 Difficulty in walking, not elsewhere classified: Secondary | ICD-10-CM | POA: Diagnosis not present

## 2017-09-27 DIAGNOSIS — M25572 Pain in left ankle and joints of left foot: Secondary | ICD-10-CM | POA: Diagnosis not present

## 2017-09-27 DIAGNOSIS — M7662 Achilles tendinitis, left leg: Secondary | ICD-10-CM | POA: Diagnosis not present

## 2017-10-01 DIAGNOSIS — R262 Difficulty in walking, not elsewhere classified: Secondary | ICD-10-CM | POA: Diagnosis not present

## 2017-10-01 DIAGNOSIS — M25572 Pain in left ankle and joints of left foot: Secondary | ICD-10-CM | POA: Diagnosis not present

## 2017-10-01 DIAGNOSIS — M7662 Achilles tendinitis, left leg: Secondary | ICD-10-CM | POA: Diagnosis not present

## 2017-10-14 DIAGNOSIS — M25572 Pain in left ankle and joints of left foot: Secondary | ICD-10-CM | POA: Diagnosis not present

## 2017-10-14 DIAGNOSIS — M7662 Achilles tendinitis, left leg: Secondary | ICD-10-CM | POA: Diagnosis not present

## 2017-10-14 DIAGNOSIS — R262 Difficulty in walking, not elsewhere classified: Secondary | ICD-10-CM | POA: Diagnosis not present

## 2017-10-18 LAB — HM DIABETES EYE EXAM

## 2017-10-19 ENCOUNTER — Encounter: Payer: Self-pay | Admitting: Adult Health

## 2017-10-19 ENCOUNTER — Ambulatory Visit (INDEPENDENT_AMBULATORY_CARE_PROVIDER_SITE_OTHER): Payer: 59 | Admitting: Adult Health

## 2017-10-19 VITALS — BP 110/62 | HR 106 | Temp 97.3°F | Ht 63.0 in | Wt 184.6 lb

## 2017-10-19 DIAGNOSIS — N3941 Urge incontinence: Secondary | ICD-10-CM | POA: Diagnosis not present

## 2017-10-19 DIAGNOSIS — N390 Urinary tract infection, site not specified: Secondary | ICD-10-CM

## 2017-10-19 DIAGNOSIS — M7662 Achilles tendinitis, left leg: Secondary | ICD-10-CM | POA: Diagnosis not present

## 2017-10-19 DIAGNOSIS — R300 Dysuria: Secondary | ICD-10-CM | POA: Diagnosis not present

## 2017-10-19 MED ORDER — NITROFURANTOIN MONOHYD MACRO 100 MG PO CAPS: 100.0000 mg | ORAL_CAPSULE | Freq: Two times a day (BID) | ORAL | 0 refills | Status: AC

## 2017-10-19 NOTE — Progress Notes (Signed)
Assessment and Plan:  Amanda Cooper was seen today for dysuria.  Diagnoses and all orders for this visit:  Urinary tract infection without hematuria, site unspecified Diflucan didn't improve symptoms, presume UTI, no recent abx, will proceed with macrobid Maintain adequate hydration, hygiene discussed Follow up if symptoms not improving, and as needed. -     Urinalysis w microscopic + reflex cultur -     nitrofurantoin, macrocrystal-monohydrate, (MACROBID) 100 MG capsule; Take 1 capsule (100 mg total) by mouth 2 (two) times daily for 5 days.  Urge incontinence After discussion, she would like to try myrbetriq, declines pelvic PT Information provided, 25 mg daily samples given, to be started once UTI has cleared up Call back to report progress Refer to uro  Further disposition pending results of labs. Discussed med's effects and SE's.   Over 15 minutes of exam, counseling, chart review, and critical decision making was performed.   Future Appointments  Date Time Provider [place]  10/21/2017 10:00 AM Coralyn Helling, DO [facility]  01/03/2018  8:45 AM Liane Comber, NP [facility] None  04/11/2018  9:00 AM Vicie Mutters, PA-C [facility] None    ------------------------------------------------------------------------------------------------------------------  HPI BP 110/62   Pulse (!) 106   Temp (!) 97.3 F (36.3 C)   Ht 5' 3"  (1.6 m)   Wt 184 lb 9.6 oz (83.7 kg)   SpO2 96%   BMI 32.70 kg/m   62 y.o.female with T2DM and obesity presents for evaluation of UTI symptoms; reports dysuria, cloudy urine with some suprapubic pressure x 3 days, getting worse. She had leftover diflucan from previous and took this without improvement. She denies fever/chills, hematuria, flank pain. Denies vaginal discharge.   She is fresh back from cruise last week, and was in hot tub every night.  She is not recently sexually active, married, no new partners.   She has ongoing urge  incontinence, getting worse recently; requesting information about medications today.   Past Medical History:  Diagnosis Date  . Anemia   . Anxiety   . Colon polyps   . Diabetes mellitus without complication ([place])   . Gout   . Hypertriglyceridemia   . Obstructive sleep apnea on CPAP    uses CPAP nightly  . 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 Clinoril [Sulindac]   . Erythromycin   . Shrimp [Shellfish Allergy]     Current Outpatient Medications on File Prior to Visit  Medication Sig  . allopurinol (ZYLOPRIM) 100 MG tablet TAKE 1 TABLET BY MOUTH EVERY DAY  . aspirin 81 MG tablet Take 81 mg by mouth daily.  . Blood Glucose Monitoring Suppl (ACCU-CHEK AVIVA PLUS) w/Device KIT Test sugar once daily 11.9  . Cholecalciferol (VITAMIN D) 2000 UNITS tablet Take 2,000 Units by mouth 2 (two) times daily.  . Empagliflozin-metFORMIN HCl ER (SYNJARDY XR) 25-1000 MG TB24 Take 25 mg by mouth daily.  Marland Kitchen gabapentin (NEURONTIN) 300 MG capsule Take 1 capsule (300 mg total) by mouth 3 (three) times daily.  Marland Kitchen glimepiride (AMARYL) 4 MG tablet Take 1 tablet (4 mg total) by mouth daily with breakfast.  . glucose blood (ACCU-CHEK AVIVA PLUS) test strip Test sugar once daily DX 11.9  . lisinopril (PRINIVIL,ZESTRIL) 10 MG tablet TAKE 1 TABLET BY MOUTH EVERY DAY  . meloxicam (MOBIC) 15 MG tablet TAKE 1/2 TABLET TWICE A DAY WITH FOOD FOR PAIN AND SWELLING  . metFORMIN (GLUCOPHAGE XR) 500 MG 24 hr tablet 1 tablet PO  BID with largest meals.  . Multiple Vitamin (MULTIVITAMIN) capsule Take 1 capsule by mouth daily.  . Omega-3 Fatty Acids (FISH OIL) 1000 MG CAPS Take by mouth daily.  . phentermine (ADIPEX-P) 37.5 MG tablet Take 1 tablet (37.5 mg total) by mouth daily before breakfast.  . sitaGLIPtin (JANUVIA) 100 MG tablet Take 1 tablet (100 mg total) by mouth daily.   No current facility-administered medications on file prior to visit.     ROS: all  negative except above.   Physical Exam:  BP 110/62   Pulse (!) 106   Temp (!) 97.3 F (36.3 C)   Ht 5' 3"  (1.6 m)   Wt 184 lb 9.6 oz (83.7 kg)   SpO2 96%   BMI 32.70 kg/m   General Appearance: Well nourished, in no apparent distress. Eyes: PERRLA, EOMs, conjunctiva no swelling or erythema Sinuses: No Frontal/maxillary tenderness ENT/Mouth: Ext aud canals clear, TMs without erythema, bulging. No erythema, swelling, or exudate on post pharynx.  Tonsils not swollen or erythematous. Hearing normal.  Neck: Supple, thyroid normal.  Respiratory: Respiratory effort normal, BS equal bilaterally without rales, rhonchi, wheezing or stridor.  Cardio: RRR with no MRGs. Brisk peripheral pulses without edema.  Abdomen: Soft, + BS.  Non tender, no guarding, rebound, hernias, masses. Lymphatics: Non tender without lymphadenopathy.  Musculoskeletal: Full ROM, 5/5 strength, normal gait.  Skin: Warm, dry without rashes, lesions, ecchymosis.  Neuro: Cranial nerves intact. Normal muscle tone, no cerebellar symptoms. Sensation intact.  Psych: Awake and oriented X 3, normal affect, Insight and Judgment appropriate.     Izora Ribas, NP 11:23 AM [facility]

## 2017-10-19 NOTE — Patient Instructions (Signed)
Urinary Tract Infection, Adult A urinary tract infection (UTI) is an infection of any part of the urinary tract. The urinary tract includes the:  Kidneys.  Ureters.  Bladder.  Urethra.  These organs make, store, and get rid of pee (urine) in the body. Follow these instructions at home:  Take over-the-counter and prescription medicines only as told by your doctor.  If you were prescribed an antibiotic medicine, take it as told by your doctor. Do not stop taking the antibiotic even if you start to feel better.  Avoid the following drinks: ? Alcohol. ? Caffeine. ? Tea. ? Carbonated drinks.  Drink enough fluid to keep your pee clear or pale yellow.  Keep all follow-up visits as told by your doctor. This is important.  Make sure to: ? Empty your bladder often and completely. Do not to hold pee for long periods of time. ? Empty your bladder before and after sex. ? Wipe from front to back after a bowel movement if you are female. Use each tissue one time when you wipe. Contact a doctor if:  You have back pain.  You have a fever.  You feel sick to your stomach (nauseous).  You throw up (vomit).  Your symptoms do not get better after 3 days.  Your symptoms go away and then come back. Get help right away if:  You have very bad back pain.  You have very bad lower belly (abdominal) pain.  You are throwing up and cannot keep down any medicines or water. This information is not intended to replace advice given to you by your health care provider. Make sure you discuss any questions you have with your health care provider. Document Released: 07/15/2007 Document Revised: 07/04/2015 Document Reviewed: 12/17/2014 Elsevier Interactive Patient Education  2018 [place] extended-release tablets What is this medicine? MIRABEGRON (MIR a BEG ron) is used to treat overactive bladder. This medicine reduces the amount of bathroom visits. It may also help to control  wetting accidents. This medicine may be used for other purposes; ask your health care provider or pharmacist if you have questions. COMMON BRAND NAME(S): Myrbetriq What should I tell my health care provider before I take this medicine? They need to know if you have any of these conditions: -difficulty passing urine -high blood pressure -kidney disease -liver disease -an unusual or allergic reaction to mirabegron, other medicines, foods, dyes, or preservatives -pregnant or trying to get pregnant -breast-feeding How should I use this medicine? Take this medicine by mouth with a glass of water. Follow the directions on the prescription label. Do not cut, crush or chew this medicine. You can take it with or without food. If it upsets your stomach, take it with food. Take your medicine at regular intervals. Do not take it more often than directed. Do not stop taking except on your doctor's advice. Talk to your pediatrician regarding the use of this medicine in children. Special care may be needed. Overdosage: If you think you have taken too much of this medicine contact a poison control center or emergency room at once. NOTE: This medicine is only for you. Do not share this medicine with others. What if I miss a dose? If you miss a dose, take it as soon as you can. If it is almost time for your next dose, take only that dose. Do not take double or extra doses. What may interact with this medicine? -certain medicines for bladder problems like fesoterodine, oxybutynin, solifenacin, tolterodine -  desipramine -digoxin -flecainide -ketoconazole -MAOIs like Carbex, Eldepryl, Marplan, Nardil, and Parnate -metoprolol -propafenone -thioridazine -warfarin This list may not describe all possible interactions. Give your health care provider a list of all the medicines, herbs, non-prescription drugs, or dietary supplements you use. Also tell them if you smoke, drink alcohol, or use illegal drugs. Some  items may interact with your medicine. What should I watch for while using this medicine? It may take 8 weeks to notice the full benefit from this medicine. You may need to limit your intake tea, coffee, caffeinated sodas, and alcohol. These drinks may make your symptoms worse. Visit your doctor or health care professional for regular checks on your progress. Check your blood pressure as directed. Ask your doctor or health care professional what your blood pressure should be and when you should contact him or her. What side effects may I notice from receiving this medicine? Side effects that you should report to your doctor or health care professional as soon as possible: -allergic reactions like skin rash, itching or hives, swelling of the face, lips, or tongue -chest pain or palpitations -severe or sudden headache -high blood pressure -fast, irregular heartbeat -redness, blistering, peeling or loosening of the skin, including inside the mouth -signs of infection like fever or chills; cough; sore throat; pain or difficulty passing urine -trouble passing urine or change in the amount of urine Side effects that usually do not require medical attention (report to your doctor or health care professional if they continue or are bothersome): -constipation -diarrhea -dizziness -dry eyes -joint pain -mild headache -nausea -runny nose This list may not describe all possible side effects. Call your doctor for medical advice about side effects. You may report side effects to FDA at 1-800-FDA-1088. Where should I keep my medicine? Keep out of the reach of children. Store at room temperature between 15 and 30 degrees C (59 and 86 degrees F). Throw away any unused medicine after the expiration date. NOTE: This sheet is a summary. It may not cover all possible information. If you have questions about this medicine, talk to your doctor, pharmacist, or health care provider.  2018 Elsevier/Gold Standard  (2015-02-28 12:14:30)

## 2017-10-20 ENCOUNTER — Other Ambulatory Visit: Payer: Self-pay | Admitting: Adult Health

## 2017-10-21 ENCOUNTER — Encounter: Payer: 59 | Admitting: Sports Medicine

## 2017-10-22 LAB — URINALYSIS W MICROSCOPIC + REFLEX CULTURE
BILIRUBIN URINE: NEGATIVE
Hyaline Cast: NONE SEEN /LPF
Nitrites, Initial: NEGATIVE
Specific Gravity, Urine: 1.037 — ABNORMAL HIGH (ref 1.001–1.03)
Squamous Epithelial / HPF: NONE SEEN /HPF (ref <=5)
pH: <=5 (ref 5.0–8.0)

## 2017-10-22 LAB — URINE CULTURE
MICRO NUMBER: 91087401
SPECIMEN QUALITY: ADEQUATE

## 2017-10-22 LAB — CULTURE INDICATED

## 2017-10-28 ENCOUNTER — Encounter: Payer: Self-pay | Admitting: Sports Medicine

## 2017-11-02 ENCOUNTER — Other Ambulatory Visit: Payer: Self-pay | Admitting: Physician Assistant

## 2017-11-04 ENCOUNTER — Ambulatory Visit (INDEPENDENT_AMBULATORY_CARE_PROVIDER_SITE_OTHER): Payer: 59 | Admitting: Sports Medicine

## 2017-11-04 VITALS — BP 110/72 | Ht 63.0 in | Wt 185.0 lb

## 2017-11-04 DIAGNOSIS — M25579 Pain in unspecified ankle and joints of unspecified foot: Secondary | ICD-10-CM

## 2017-11-04 NOTE — Progress Notes (Signed)
  Amanda Cooper - 62 y.o. female MRN [id number]  Date of birth: [birth year]    SUBJECTIVE:      Chief Complaint:/ HPI:  62 year old female presents on referral from [facility] for orthotics.  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  She recently underwent surgery for this.  She reports doing much better since the surgery.  She also notes improvement with acupuncture treatment.  Today she reports no significant pain in her heel.  She has been primarily wearing sneakers with gel cups inserts.   ROS:     See HPI  PERTINENT  PMH / PSH FH / / SH:  Past Medical, Surgical, Social, and Family History Reviewed & Updated in the EMR.     OBJECTIVE: BP 110/72   Ht 5\' 3"  (1.6 m)   Wt 185 lb (83.9 kg)   BMI 32.77 kg/m   Physical Exam:  Vital signs are reviewed.  GEN: Alert and oriented, NAD Pulm: Breathing unlabored PSY: normal mood, congruent affect  MSK: Right foot: Inspection: No swelling or erythema.  Well-healed postsurgical scar.  There is calcaneal valgus deformity which is mild.  Arch is preserved fairly well with weightbearing Palpation: No tenderness to palpation ROM: Full  ROM of the ankle. Normal midfoot flexibility Strength: 5/5 strength Neurovascular: N/V intact Special tests: normal midfoot flexibility. Normal calcaneal motion with heel raise  Left foot: Inspection: Well-healed postsurgical scar.  No swelling or erythema.  There is calcaneal valgus deformity which is mild.  Arch is preserved fairly well with weightbearing Palpation: No tenderness to palpation ROM: Full  ROM of the ankle. Normal midfoot flexibility Strength: 5/5 strength Neurovascular: N/V intact Special tests: normal midfoot flexibility. Normal calcaneal motion with heel raise   ASSESSMENT & PLAN:  1.  Bilateral heel pain-at this point her pain is intermittent.  On exam her arches are fairly well-preserved with weightbearing.  She does have some mild calcaneal valgus  bilaterally.  I think at this time she would benefit most from slight heel lift.  Patient provided with 3/16 heel lift for her shoes bilaterally.  She will wear this for several weeks to evaluate.  If no notable difference or any new pains, she will return.  May also consider medial calcaneal wedge to correct her slight valgus deformity.  Of course, custom orthotics are as an option if she desires.

## 2017-11-12 ENCOUNTER — Other Ambulatory Visit: Payer: Self-pay | Admitting: Internal Medicine

## 2017-11-12 DIAGNOSIS — M1A9XX0 Chronic gout, unspecified, without tophus (tophi): Secondary | ICD-10-CM

## 2017-11-15 ENCOUNTER — Other Ambulatory Visit: Payer: Self-pay | Admitting: Physician Assistant

## 2017-11-15 DIAGNOSIS — E119 Type 2 diabetes mellitus without complications: Secondary | ICD-10-CM

## 2017-12-02 DIAGNOSIS — M7662 Achilles tendinitis, left leg: Secondary | ICD-10-CM | POA: Diagnosis not present

## 2017-12-09 DIAGNOSIS — G4733 Obstructive sleep apnea (adult) (pediatric): Secondary | ICD-10-CM | POA: Diagnosis not present

## 2017-12-20 ENCOUNTER — Ambulatory Visit (INDEPENDENT_AMBULATORY_CARE_PROVIDER_SITE_OTHER): Payer: 59 | Admitting: Physician Assistant

## 2017-12-20 ENCOUNTER — Encounter: Payer: Self-pay | Admitting: Physician Assistant

## 2017-12-20 VITALS — BP 114/78 | HR 68 | Temp 98.4°F | Resp 16 | Ht 63.0 in | Wt 184.6 lb

## 2017-12-20 DIAGNOSIS — R350 Frequency of micturition: Secondary | ICD-10-CM | POA: Diagnosis not present

## 2017-12-20 DIAGNOSIS — N819 Female genital prolapse, unspecified: Secondary | ICD-10-CM | POA: Diagnosis not present

## 2017-12-20 MED ORDER — SULFAMETHOXAZOLE-TRIMETHOPRIM 800-160 MG PO TABS: 1.0000 | ORAL_TABLET | Freq: Two times a day (BID) | ORAL | 0 refills | Status: DC

## 2017-12-20 NOTE — Progress Notes (Signed)
Assessment and Plan:  Urinary frequency -     Urinalysis, Routine w reflex microscopic -     Urine Culture -     Ambulatory referral to Gynecology  Female genital prolapse, unspecified type Possible contributing to symptoms Had PAP 1 year ago with cystocele, declines exam today will refer to GYN -     Urinalysis, Routine w reflex microscopic -     Urine Culture -     Ambulatory referral to Gynecology -     sulfamethoxazole-trimethoprim (BACTRIM DS,SEPTRA DS) 800-160 MG tablet; Take 1 tablet by mouth 2 (two) times daily.     Further disposition pending results of labs. Discussed med's effects and SE's.   Over 15 minutes of exam, counseling, chart review, and critical decision making was performed.   Future Appointments  Date Time Provider [place]  01/03/2018  8:45 AM Liane Comber, NP [facility] None  04/11/2018  9:00 AM Vicie Mutters, PA-C [facility] None    ------------------------------------------------------------------------------------------------------------------  HPI BP 114/78   Pulse 68   Temp 98.4 F (36.9 C)   Resp 16   Ht 5' 3"  (1.6 m)   Wt 184 lb 9.6 oz (83.7 kg)   SpO2 98%   BMI 32.70 kg/m   62 y.o.female with T2DM and obesity presents for evaluation of UTI symptoms that she has had recurrently; this time started on overnight plane ride back from [place] and started with symptoms. She reports frequency, pressure, cloudy urine, pressure feels like her "insides are coming out". She states the pain was better after BM. Has history of cytocele/rectocele on exam 1 year ago.    Has been taking azo that helped.  She denies fever/chills, hematuria, flank pain. Denies vaginal discharge.   She is not recently sexually active, married, no new partners.   She has ongoing urge incontinence, getting worse, states myrebetiq did no help.  Last PAP 02/2016 normal.   Past Medical History:  Diagnosis Date  . Anemia   . Anxiety   . Colon polyps   . Diabetes  mellitus without complication ([place])   . Gout   . Hypertriglyceridemia   . Obstructive sleep apnea on CPAP    uses CPAP nightly  . 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 Clinoril [Sulindac]   . Erythromycin   . Shrimp [Shellfish Allergy]     Current Outpatient Medications on File Prior to Visit  Medication Sig  . allopurinol (ZYLOPRIM) 100 MG tablet TAKE 1 TABLET BY MOUTH EVERY DAY  . aspirin 81 MG tablet Take 81 mg by mouth daily.  . Blood Glucose Monitoring Suppl (ACCU-CHEK AVIVA PLUS) w/Device KIT Test sugar once daily 11.9  . Cholecalciferol (VITAMIN D) 2000 UNITS tablet Take 2,000 Units by mouth 2 (two) times daily.  . Empagliflozin-metFORMIN HCl ER (SYNJARDY XR) 25-1000 MG TB24 Take 25 mg by mouth daily.  Marland Kitchen gabapentin (NEURONTIN) 300 MG capsule Take 1 capsule (300 mg total) by mouth 3 (three) times daily.  Marland Kitchen glimepiride (AMARYL) 4 MG tablet TAKE 1 TABLET (4 MG TOTAL) BY MOUTH DAILY WITH BREAKFAST.  Marland Kitchen glucose blood (ACCU-CHEK AVIVA PLUS) test strip Test sugar once daily DX 11.9  . JANUVIA 100 MG tablet TAKE 1 TABLET BY MOUTH EVERY DAY  . lisinopril (PRINIVIL,ZESTRIL) 10 MG tablet TAKE 1 TABLET BY MOUTH EVERY DAY  . metFORMIN (GLUCOPHAGE XR) 500 MG 24 hr tablet 1 tablet PO BID with largest meals.  . Multiple Vitamin (MULTIVITAMIN) capsule  Take 1 capsule by mouth daily.  . Omega-3 Fatty Acids (FISH OIL) 1000 MG CAPS Take by mouth daily.  . phentermine (ADIPEX-P) 37.5 MG tablet Take 1 tablet (37.5 mg total) by mouth daily before breakfast.   No current facility-administered medications on file prior to visit.     ROS: all negative except above.   Physical Exam:  BP 114/78   Pulse 68   Temp 98.4 F (36.9 C)   Resp 16   Ht 5' 3"  (1.6 m)   Wt 184 lb 9.6 oz (83.7 kg)   SpO2 98%   BMI 32.70 kg/m   General Appearance: Well nourished, in no apparent distress. Eyes: PERRLA, EOMs, conjunctiva no swelling or  erythema Sinuses: No Frontal/maxillary tenderness ENT/Mouth: Ext aud canals clear, TMs without erythema, bulging. No erythema, swelling, or exudate on post pharynx.  Tonsils not swollen or erythematous. Hearing normal.  Neck: Supple, thyroid normal.  Respiratory: Respiratory effort normal, BS equal bilaterally without rales, rhonchi, wheezing or stridor.  Cardio: RRR with no MRGs. Brisk peripheral pulses without edema.  Abdomen: Soft, + BS.  Non tender, no guarding, rebound, hernias, masses. Lymphatics: Non tender without lymphadenopathy.  Musculoskeletal: Full ROM, 5/5 strength, normal gait.  Skin: Warm, dry without rashes, lesions, ecchymosis.  Neuro: Cranial nerves intact. Normal muscle tone, no cerebellar symptoms. Sensation intact.  Psych: Awake and oriented X 3, normal affect, Insight and Judgment appropriate.     Vicie Mutters, PA-C 2:29 PM [facility]

## 2017-12-20 NOTE — Patient Instructions (Signed)
Pelvic Organ Prolapse Pelvic organ prolapse is the stretching, bulging, or dropping of pelvic organs into an abnormal position. It happens when the muscles and tissues that surround and support pelvic structures are stretched or weak. Pelvic organ prolapse can involve:  Vagina (vaginal prolapse).  Uterus (uterine prolapse).  Bladder (cystocele).  Rectum (rectocele).  Intestines (enterocele).  When organs other than the vagina are involved, they often bulge into the vagina or protrude from the vagina, depending on how severe the prolapse is. What are the causes? Causes of this condition include:  Pregnancy, labor, and childbirth.  Long-lasting (chronic) cough.  Chronic constipation.  Obesity.  Past pelvic surgery.  Aging. During and after menopause, a decreased production of the hormone estrogen can weaken pelvic ligaments and muscles.  Consistently lifting more than 50 lb (23 kg).  Buildup of fluid in the abdomen due to certain diseases and other conditions.  What are the signs or symptoms? Symptoms of this condition include:  Loss of bladder control when you cough, sneeze, strain, and exercise (stress incontinence). This may be worse immediately following childbirth, and it may gradually improve over time.  Feeling pressure in your pelvis or vagina. This pressure may increase when you cough or when you are having a bowel movement.  A bulge that protrudes from the opening of your vagina or against your vaginal wall. If your uterus protrudes through the opening of your vagina and rubs against your clothing, you may also experience soreness, ulcers, infection, pain, and bleeding.  Increased effort to have a bowel movement or urinate.  Pain in your low back.  Pain, discomfort, or disinterest in sexual intercourse.  Repeated bladder infections (urinary tract infections).  Difficulty inserting or inability to insert a tampon or applicator.  In some people, this  condition does not cause any symptoms. How is this diagnosed? Your health care provider may perform an internal and external vaginal and rectal exam. During the exam, you may be asked to cough and strain while you are lying down, sitting, and standing up. Your health care provider will determine if other tests are required, such as bladder function tests. How is this treated? In most cases, this condition needs to be treated only if it produces symptoms. No treatment is guaranteed to correct the prolapse or relieve the symptoms completely. Treatment may include:  Lifestyle changes, such as: ? Avoiding drinking beverages that contain caffeine. ? Increasing your intake of high-fiber foods. This can help to decrease constipation and straining during bowel movements. ? Emptying your bladder at scheduled times (bladder training therapy). This can help to reduce or avoid urinary incontinence. ? Losing weight if you are overweight or obese.  Estrogen. Estrogen may help mild prolapse by increasing the strength and tone of pelvic floor muscles.  Kegel exercises. These may help mild cases of prolapse by strengthening and tightening the muscles of the pelvic floor.  Pessary insertion. A pessary is a soft, flexible device that is placed into your vagina by your health care provider to help support the vaginal walls and keep pelvic organs in place.  Surgery. This is often the only form of treatment for severe prolapse. Different types of surgeries are available.  Follow these instructions at home:  Wear a sanitary pad or absorbent product if you have urinary incontinence.  Avoid heavy lifting and straining with exercise and work. Do not hold your breath when you perform mild to moderate lifting and exercise activities. Limit your activities as directed by your health care   provider.  Take medicines only as directed by your health care provider.  Perform Kegel exercises as directed by your health care  provider.  If you have a pessary, take care of it as directed by your health care provider. Contact a health care provider if:  Your symptoms interfere with your daily activities or sex life.  You need medicine to help with the discomfort.  You notice bleeding from the vagina that is not related to your period.  You have a fever.  You have pain or bleeding when you urinate.  You have bleeding when you have a bowel movement.  You lose urine when you have sex.  You have chronic constipation.  You have a pessary that falls out.  You have vaginal discharge that has a bad smell.  You have low abdominal pain or cramping that is unusual for you. This information is not intended to replace advice given to you by your health care provider. Make sure you discuss any questions you have with your health care provider. Document Released: 08/23/2013 Document Revised: 07/04/2015 Document Reviewed: 04/10/2013 Elsevier Interactive Patient Education  2018 [employer]  

## 2017-12-22 LAB — URINALYSIS, ROUTINE W REFLEX MICROSCOPIC
Bacteria, UA: NONE SEEN /HPF
Bilirubin Urine: NEGATIVE
HYALINE CAST: NONE SEEN /LPF
Ketones, ur: NEGATIVE
NITRITE: NEGATIVE
PH: 5.5 (ref 5.0–8.0)
Specific Gravity, Urine: 1.04 — ABNORMAL HIGH (ref 1.001–1.03)
Squamous Epithelial / LPF: NONE SEEN /HPF (ref <=5)
WBC, UA: >=60 /HPF — AB (ref 0–5)

## 2017-12-22 LAB — URINE CULTURE
MICRO NUMBER:: 91356535
SPECIMEN QUALITY:: ADEQUATE

## 2017-12-30 NOTE — Progress Notes (Signed)
FOLLOW UP  Assessment and Plan:   Hypertension Well controlled with current medications  Monitor blood pressure at home; patient to call if consistently greater than 130/80 Continue DASH diet.   Reminder to go to the ER if any CP, SOB, nausea, dizziness, severe HA, changes vision/speech, left arm numbness and tingling and jaw pain.  Cholesterol Currently at LDL goal; trigs remain elevated discussed will start fenofibrate if over 500 Continue low cholesterol diet and exercise.  Check lipid panel.   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  Perform daily foot/skin check, notify office of any concerning changes.  Check A1C  Obesity with co morbidities Long discussion about weight loss, diet, and exercise Recommended diet heavy in fruits and veggies and low in animal meats, cheeses, and dairy products, appropriate calorie intake Discussed ideal weight for height  Patient to restart phentermine, reminded to take frequent drug breaks Cut out soda; switch to stevia sweetened Will follow up in 3 months  Vitamin D Def Continue supplementation Defer Vit D level  Gout Continue allopurinol Diet discussed Check uric acid as needed  Elevated LFTs Hasn't had hepatitis panel or abd imaging, check hepatitis to rule out, will follow up US  Suspect fatty liver  Continue diet and meds as discussed. Further disposition pending results of labs. Discussed med's effects and SE's.   Over 30 minutes of exam, counseling, chart review, and critical decision making was performed.   Future Appointments  Date Time Provider [place]  04/11/2018  9:00 AM Vicie Mutters, PA-C [id number] None     ----------------------------------------------------------------------------------------------------------------------  HPI 62 y.o. female  presents for 3 month follow up on hypertension, cholesterol, diabetes, obesity, gout and vitamin D deficiency.   she is prescribed phentermine for weight loss but reports she hasn't started taking. While on the medication they have lost 0 lbs since last visit. They deny palpitations, anxiety, trouble sleeping, elevated BP.    BMI is Body mass index is 33.23 kg/m., she is working on diet and exercise, starting to get back to the gym. She has been eating more at home, cutting down on fast food.  Wt Readings from Last 3 Encounters:  01/03/18 187 lb 9.6 oz (85.1 kg)  12/31/17 185 lb (83.9 kg)  12/20/17 184 lb 9.6 oz (83.7 kg)   Her blood pressure has been controlled at home, today their BP is BP: 110/68  She does workout. She denies chest pain, shortness of breath, dizziness.   She is not on cholesterol medication due to excellent control with fish oil supplement. Her LDL cholesterol is at goal. Trigs remain elevated. She does admit to some soda intake recently. The cholesterol last visit was:    Lab Results  Component Value Date   CHOL 149 09/24/2017   HDL 33 (L) 09/24/2017   LDLCALC 69 09/24/2017   TRIG 394 (H) 09/24/2017   CHOLHDL 4.5 09/24/2017    She has been working on diet and exercise for T2 diabetes (on januvia, synjardy + metformin - only taking 500 mg, amaryl), and denies foot ulcerations, increased appetite, nausea, paresthesia of the feet, visual disturbances, vomiting and weight loss. Her fasting sugars have been running 160-200. Last A1C in the office was:  Lab Results  Component Value Date   HGBA1C 8.7 (H) 09/24/2017   Patient is on Vitamin D  supplement.   Lab Results  Component Value Date   VD25OH 57 01/07/2016    Patient is on allopurinol for gout and does not report a recent flare.  Lab Results  Component Value Date    LABURIC 6.0 04/05/2017     Current Medications:  Current Outpatient Medications on File Prior to Visit  Medication Sig  . allopurinol (ZYLOPRIM) 100 MG tablet TAKE 1 TABLET BY MOUTH EVERY DAY  . aspirin 81 MG tablet Take 81 mg by mouth daily.  . Blood Glucose Monitoring Suppl (ACCU-CHEK AVIVA PLUS) w/Device KIT Test sugar once daily 11.9  . Cholecalciferol (VITAMIN D) 2000 UNITS tablet Take 2,000 Units by mouth 2 (two) times daily.  . Empagliflozin-metFORMIN HCl ER (SYNJARDY XR) 25-1000 MG TB24 Take 25 mg by mouth daily.  Marland Kitchen gabapentin (NEURONTIN) 300 MG capsule Take 1 capsule (300 mg total) by mouth 3 (three) times daily.  Marland Kitchen glimepiride (AMARYL) 4 MG tablet TAKE 1 TABLET (4 MG TOTAL) BY MOUTH DAILY WITH BREAKFAST.  Marland Kitchen glucose blood (ACCU-CHEK AVIVA PLUS) test strip Test sugar once daily DX 11.9  . JANUVIA 100 MG tablet TAKE 1 TABLET BY MOUTH EVERY DAY  . lisinopril (PRINIVIL,ZESTRIL) 10 MG tablet TAKE 1 TABLET BY MOUTH EVERY DAY  . metFORMIN (GLUCOPHAGE XR) 500 MG 24 hr tablet 1 tablet PO BID with largest meals.  . Multiple Vitamin (MULTIVITAMIN) capsule Take 1 capsule by mouth daily.  . Omega-3 Fatty Acids (FISH OIL) 1000 MG CAPS Take by mouth daily.  . phentermine (ADIPEX-P) 37.5 MG tablet Take 1 tablet (37.5 mg total) by mouth daily before breakfast. (Patient not taking: Reported on 01/03/2018)  . sulfamethoxazole-trimethoprim (BACTRIM DS,SEPTRA DS) 800-160 MG tablet Take 1 tablet by mouth 2 (two) times daily.   No current facility-administered medications on file prior to visit.      Allergies:  Allergies  Allergen Reactions  . Clinoril [Sulindac]   . Erythromycin   . Shrimp [Shellfish Allergy]      Medical History:  Past Medical History:  Diagnosis Date  . Anemia   . Anxiety   . Colon polyps   . Diabetes mellitus without complication ([place])   . Gout   . Hypertriglyceridemia   . Obstructive sleep apnea on CPAP    uses CPAP nightly  . 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  HENT: Negative for hearing loss and tinnitus.   Eyes: Negative for blurred vision and double vision.  Respiratory: Negative for cough, shortness of breath and wheezing.   Cardiovascular: Negative for chest pain, palpitations, orthopnea, claudication and leg swelling.  Gastrointestinal: Negative for abdominal pain, blood in stool, constipation, diarrhea, heartburn, melena, nausea and vomiting.  Genitourinary: Negative.   Musculoskeletal: Negative for joint pain and myalgias.  Skin: Negative for rash.  Neurological: Negative for dizziness, tingling, sensory change, weakness and headaches.  Endo/Heme/Allergies: Negative for polydipsia.  Psychiatric/Behavioral: Negative.   All other systems reviewed and are negative.    Physical Exam: BP 110/68   Pulse 79   Temp (!) 97.5 F (36.4 C)   Ht _0  (1.6 m)   Wt 187 lb 9.6 oz (85.1 kg)   SpO2 96%   BMI 33.23 kg/m  Wt Readings from Last 3 Encounters:  01/03/18 187 lb 9.6 oz (85.1 kg)  12/31/17 185 lb (83.9 kg)  12/20/17  184 lb 9.6 oz (83.7 kg)   General Appearance: Well nourished, in no apparent distress. Eyes: PERRLA, EOMs, conjunctiva no swelling or erythema Sinuses: No Frontal/maxillary tenderness ENT/Mouth: Ext aud canals clear, TMs without erythema, bulging. No erythema, swelling, or exudate on post pharynx.  Tonsils not swollen or erythematous. Hearing normal.  Neck: Supple, thyroid normal.  Respiratory: Respiratory effort normal, BS equal bilaterally without rales, rhonchi, wheezing or stridor.  Cardio: RRR with no MRGs. Brisk peripheral pulses without edema.  Abdomen: Soft, + BS.  Non tender, no guarding, rebound, hernias, masses. Lymphatics: Non tender without lymphadenopathy.  Musculoskeletal: Full ROM, 5/5  strength, Normal gait Skin: Warm, dry without rashes, lesions, ecchymosis.  Neuro: Cranial nerves intact. No cerebellar symptoms.  Psych: Awake and oriented X 3, normal affect, Insight and Judgment appropriate.    Amanda Ribas, NP 8:47 AM [facility]

## 2017-12-31 ENCOUNTER — Ambulatory Visit (INDEPENDENT_AMBULATORY_CARE_PROVIDER_SITE_OTHER): Payer: 59 | Admitting: Obstetrics & Gynecology

## 2017-12-31 ENCOUNTER — Encounter: Payer: Self-pay | Admitting: Obstetrics & Gynecology

## 2017-12-31 VITALS — BP 120/70 | Ht 65.0 in | Wt 185.0 lb

## 2017-12-31 DIAGNOSIS — N8110 Cystocele, unspecified: Secondary | ICD-10-CM

## 2017-12-31 DIAGNOSIS — N393 Stress incontinence (female) (male): Secondary | ICD-10-CM | POA: Diagnosis not present

## 2017-12-31 DIAGNOSIS — N3090 Cystitis, unspecified without hematuria: Secondary | ICD-10-CM | POA: Diagnosis not present

## 2017-12-31 NOTE — Progress Notes (Signed)
    Amanda Cooper September 13, 1955 [id number]        62 y.o.  G2P2L2 Married  RP: Recurrent Cystitis  HPI: Recurrent Cystitis with 2 infections in last 6 months and 3 infections in last year.  All 3 infections were E. Coli.  Last one treated with Bactrim, which was Sensitive.  Good response to treatment each time.  No UTI Sx currently.  C/O SUI with sneezing or coughing.  Doing Kegel exercises without improvement.  No feeling of vaginal bulging.  Occasionally sexually active, Cystitis not related to IC.  BMs normal.  Postmenopausal on no HRT.  S/P TAH, BSO.   OB History  Gravida Para Term Preterm AB Living  2 2       2   SAB TAB Ectopic Multiple Live Births               # Outcome Date GA Lbr Len/2nd Weight Sex Delivery Anes PTL Lv  2 Para           1 Para             Past medical history,surgical history, problem list, medications, allergies, family history and social history were all reviewed and documented in the EPIC chart.   Directed ROS with pertinent positives and negatives documented in the history of present illness/assessment and plan.  Exam:  Vitals:   12/31/17 1444  BP: 120/70  Weight: 185 lb (83.9 kg)  Height: 5\' 5"  (1.651 m)   General appearance:  Normal  CVAT negative bilaterally  Abdomen: Normal  Gynecologic exam: Vulva normal, urethral meatus normal, no erythema/inflammation   Assessment/Plan:  62 y.o. G2P2   1. Recurrent cystitis Recurrent cystitis with 2 episodes in the last 6 months and 3 episodes in the last year.  Needs investigation with urology and probably prophylactic antibiotics.  Referred to urology.  2. SUI (stress urinary incontinence, female) Will continue to do Kegel exercises, but given little improvement so far, referred to physical therapy.  3. Baden-Walker grade 1 cystocele Very mild cystocele grade 1/4.  Prevention of progression with avoidance of pelvic pressure as much as possible reviewed with patient.  Avoid overfilling the  bladder, avoid lifting heavy weights, treat cough and constipation.   Counseling on above issues and coordination of care more than 50% for 25 minutes.  Princess Bruins MD, 3:00 PM 12/31/2017

## 2018-01-03 ENCOUNTER — Encounter: Payer: Self-pay | Admitting: Adult Health

## 2018-01-03 ENCOUNTER — Ambulatory Visit (INDEPENDENT_AMBULATORY_CARE_PROVIDER_SITE_OTHER): Payer: 59 | Admitting: Adult Health

## 2018-01-03 VITALS — BP 110/68 | HR 79 | Temp 97.5°F | Ht 63.0 in | Wt 187.6 lb

## 2018-01-03 DIAGNOSIS — E119 Type 2 diabetes mellitus without complications: Secondary | ICD-10-CM

## 2018-01-03 DIAGNOSIS — R7989 Other specified abnormal findings of blood chemistry: Secondary | ICD-10-CM

## 2018-01-03 DIAGNOSIS — R945 Abnormal results of liver function studies: Secondary | ICD-10-CM | POA: Diagnosis not present

## 2018-01-03 DIAGNOSIS — E1122 Type 2 diabetes mellitus with diabetic chronic kidney disease: Secondary | ICD-10-CM | POA: Diagnosis not present

## 2018-01-03 DIAGNOSIS — E785 Hyperlipidemia, unspecified: Secondary | ICD-10-CM

## 2018-01-03 DIAGNOSIS — E669 Obesity, unspecified: Secondary | ICD-10-CM

## 2018-01-03 DIAGNOSIS — E1169 Type 2 diabetes mellitus with other specified complication: Secondary | ICD-10-CM | POA: Diagnosis not present

## 2018-01-03 DIAGNOSIS — E559 Vitamin D deficiency, unspecified: Secondary | ICD-10-CM | POA: Diagnosis not present

## 2018-01-03 DIAGNOSIS — M1A9XX0 Chronic gout, unspecified, without tophus (tophi): Secondary | ICD-10-CM

## 2018-01-03 DIAGNOSIS — I10 Essential (primary) hypertension: Secondary | ICD-10-CM | POA: Diagnosis not present

## 2018-01-03 DIAGNOSIS — E1141 Type 2 diabetes mellitus with diabetic mononeuropathy: Secondary | ICD-10-CM | POA: Diagnosis not present

## 2018-01-03 DIAGNOSIS — N182 Chronic kidney disease, stage 2 (mild): Secondary | ICD-10-CM

## 2018-01-03 DIAGNOSIS — N393 Stress incontinence (female) (male): Secondary | ICD-10-CM

## 2018-01-03 MED ORDER — METFORMIN HCL ER 500 MG PO TB24: ORAL_TABLET | ORAL | 2 refills | Status: DC

## 2018-01-03 MED ORDER — GLIMEPIRIDE 4 MG PO TABS: 8.0000 mg | ORAL_TABLET | Freq: Every day | ORAL | 1 refills | Status: DC

## 2018-01-03 NOTE — Patient Instructions (Signed)
Goals    . Exercise 150 min/wk Moderate Activity    . HEMOGLOBIN A1C < 8.0    . Weight (lb) < 180 lb (81.6 kg)        Aim for 7+ servings of fruits and vegetables daily  65-80+ fluid ounces of water or unsweet tea for healthy kidneys  Limit to max 1 drink of alcohol per day; avoid smoking/tobacco  Limit animal fats in diet for cholesterol and heart health - choose grass fed whenever available  Avoid highly processed foods, and foods high in saturated/trans fats  Aim for low stress - take time to unwind and care for your mental health  Aim for 150 min of moderate intensity exercise weekly for heart health, and weights twice weekly for bone health  Aim for 7-9 hours of sleep daily    Food Choices to Lower Your Triglycerides Triglycerides are a type of fat in your blood. High levels of triglycerides can increase the risk of heart disease and stroke. If your triglyceride levels are high, the foods you eat and your eating habits are very important. Choosing the right foods can help lower your triglycerides. What general guidelines do I need to follow?  Lose weight if you are overweight.  Limit or avoid alcohol.  Fill one half of your plate with vegetables and green salads.  Limit fruit to two servings a day. Choose fruit instead of juice.  Make one fourth of your plate whole grains. Look for the word "whole" as the first word in the ingredient list.  Fill one fourth of your plate with lean protein foods.  Enjoy fatty fish (such as salmon, mackerel, sardines, and tuna) three times a week.  Choose healthy fats.  Limit foods high in starch and sugar.  Eat more home-cooked food and less restaurant, buffet, and fast food.  Limit fried foods.  Cook foods using methods other than frying.  Limit saturated fats.  Check ingredient lists to avoid foods with partially hydrogenated oils (trans fats) in them. What foods can I eat? Grains Whole grains, such as whole wheat or  whole grain breads, crackers, cereals, and pasta. Unsweetened oatmeal, bulgur, barley, quinoa, or brown rice. Corn or whole wheat flour tortillas. Vegetables Fresh or frozen vegetables (raw, steamed, roasted, or grilled). Green salads. Fruits All fresh, canned (in natural juice), or frozen fruits. Meat and Other Protein Products Ground beef (85% or leaner), grass-fed beef, or beef trimmed of fat. Skinless chicken or [place]. Ground chicken or [place]. Pork trimmed of fat. All fish and seafood. Eggs. Dried beans, peas, or lentils. Unsalted nuts or seeds. Unsalted canned or dry beans. Dairy Low-fat dairy products, such as skim or 1% milk, 2% or reduced-fat cheeses, low-fat ricotta or cottage cheese, or plain low-fat yogurt. Fats and Oils Tub margarines without trans fats. Light or reduced-fat mayonnaise and salad dressings. Avocado. Safflower, olive, or canola oils. Natural peanut or almond butter. The items listed above may not be a complete list of recommended foods or beverages. Contact your dietitian for more options. What foods are not recommended? Grains White bread. White pasta. White rice. Cornbread. Bagels, pastries, and croissants. Crackers that contain trans fat. Vegetables White potatoes. Corn. Creamed or fried vegetables. Vegetables in a cheese sauce. Fruits Dried fruits. Canned fruit in light or heavy syrup. Fruit juice. Meat and Other Protein Products Fatty cuts of meat. Ribs, chicken wings, bacon, sausage, bologna, salami, chitterlings, fatback, hot dogs, bratwurst, and packaged luncheon meats. Dairy Whole or 2% milk, cream, half-and-half,  and cream cheese. Whole-fat or sweetened yogurt. Full-fat cheeses. Nondairy creamers and whipped toppings. Processed cheese, cheese spreads, or cheese curds. Sweets and Desserts Corn syrup, sugars, honey, and molasses. Candy. Jam and jelly. Syrup. Sweetened cereals. Cookies, pies, cakes, donuts, muffins, and ice cream. Fats and Oils Butter,  stick margarine, lard, shortening, ghee, or bacon fat. Coconut, palm kernel, or palm oils. Beverages Alcohol. Sweetened drinks (such as sodas, lemonade, and fruit drinks or punches). The items listed above may not be a complete list of foods and beverages to avoid. Contact your dietitian for more information. This information is not intended to replace advice given to you by your health care provider. Make sure you discuss any questions you have with your health care provider. Document Released: 11/14/2003 Document Revised: 07/04/2015 Document Reviewed: 11/30/2012 Elsevier Interactive Patient Education  2017 [employer].

## 2018-01-04 LAB — COMPLETE METABOLIC PANEL WITH GFR
AG RATIO: 1.9 (calc) (ref 1.0–2.5)
ALKALINE PHOSPHATASE (APISO): 112 U/L (ref 33–130)
ALT: 49 U/L — AB (ref 6–29)
AST: 35 U/L (ref 10–35)
Albumin: 4.1 g/dL (ref 3.6–5.1)
BUN: 12 mg/dL (ref 7–25)
CALCIUM: 9.8 mg/dL (ref 8.6–10.4)
CO2: 28 mmol/L (ref 20–32)
CREATININE: 0.8 mg/dL (ref 0.50–0.99)
Chloride: 102 mmol/L (ref 98–110)
GFR, EST NON AFRICAN AMERICAN: 79 mL/min/{1.73_m2} (ref >=60)
GFR, Est African American: 92 mL/min/{1.73_m2} (ref >=60)
GLOBULIN: 2.2 g/dL (ref 1.9–3.7)
Glucose, Bld: 211 mg/dL — ABNORMAL HIGH (ref 65–99)
POTASSIUM: 4.1 mmol/L (ref 3.5–5.3)
Sodium: 139 mmol/L (ref 135–146)
Total Bilirubin: 0.3 mg/dL (ref 0.2–1.2)
Total Protein: 6.3 g/dL (ref 6.1–8.1)

## 2018-01-04 LAB — HEPATITIS PANEL, ACUTE
HEP A IGM: NONREACTIVE
HEP C AB: NONREACTIVE
Hep B C IgM: NONREACTIVE
Hepatitis B Surface Ag: NONREACTIVE
SIGNAL TO CUT-OFF: 0.01 (ref <1.00)

## 2018-01-04 LAB — LIPID PANEL
CHOL/HDL RATIO: 4 (calc) (ref <5.0)
CHOLESTEROL: 148 mg/dL (ref <200)
HDL: 37 mg/dL — AB (ref >50)
LDL Cholesterol (Calc): 65 mg/dL (calc)
Non-HDL Cholesterol (Calc): 111 mg/dL (calc) (ref <130)
Triglycerides: 385 mg/dL — ABNORMAL HIGH (ref <150)

## 2018-01-04 LAB — CBC WITH DIFFERENTIAL/PLATELET
BASOS PCT: 0.9 %
Basophils Absolute: 59 cells/uL (ref 0–200)
EOS PCT: 2.9 %
Eosinophils Absolute: 189 cells/uL (ref 15–500)
HEMATOCRIT: 43.2 % (ref 35.0–45.0)
HEMOGLOBIN: 14.4 g/dL (ref 11.7–15.5)
LYMPHS ABS: 1814 {cells}/uL (ref 850–3900)
MCH: 31.8 pg (ref 27.0–33.0)
MCHC: 33.3 g/dL (ref 32.0–36.0)
MCV: 95.4 fL (ref 80.0–100.0)
MONOS PCT: 5.4 %
MPV: 10.7 fL (ref 7.5–12.5)
NEUTROS ABS: 4089 {cells}/uL (ref 1500–7800)
Neutrophils Relative %: 62.9 %
Platelets: 190 10*3/uL (ref 140–400)
RBC: 4.53 10*6/uL (ref 3.80–5.10)
RDW: 12.7 % (ref 11.0–15.0)
Total Lymphocyte: 27.9 %
WBC mixed population: 351 cells/uL (ref 200–950)
WBC: 6.5 10*3/uL (ref 3.8–10.8)

## 2018-01-04 LAB — HEMOGLOBIN A1C
HEMOGLOBIN A1C: 9 %{Hb} — AB (ref <5.7)
Mean Plasma Glucose: 212 (calc)
eAG (mmol/L): 11.7 (calc)

## 2018-01-04 LAB — TSH: TSH: 1.43 m[IU]/L (ref 0.40–4.50)

## 2018-01-04 LAB — MAGNESIUM: MAGNESIUM: 1.9 mg/dL (ref 1.5–2.5)

## 2018-01-04 LAB — VITAMIN D 25 HYDROXY (VIT D DEFICIENCY, FRACTURES): Vit D, 25-Hydroxy: 55 ng/mL (ref 30–100)

## 2018-01-07 ENCOUNTER — Encounter: Payer: Self-pay | Admitting: Obstetrics & Gynecology

## 2018-01-07 NOTE — Patient Instructions (Signed)
1. Recurrent cystitis Recurrent cystitis with 2 episodes in the last 6 months and 3 episodes in the last year.  Needs investigation with urology and probably prophylactic antibiotics.  Referred to urology.  2. SUI (stress urinary incontinence, female) Will continue to do Kegel exercises, but given little improvement so far, referred to physical therapy.  3. Baden-Walker grade 1 cystocele Very mild cystocele grade 1/4.  Prevention of progression with avoidance of pelvic pressure as much as possible reviewed with patient.  Avoid overfilling the bladder, avoid lifting heavy weights, treat cough and constipation.   Amanda Cooper, it was a pleasure seeing you today!

## 2018-01-10 ENCOUNTER — Telehealth: Payer: Self-pay | Admitting: *Deleted

## 2018-01-10 NOTE — Telephone Encounter (Signed)
Office notes faxed to [facility] they will call to schedule. 

## 2018-01-10 NOTE — Telephone Encounter (Signed)
-----   Message from Princess Bruins, MD sent at 12/31/2017  3:19 PM EST ----- Regarding: Refer to Urology and PT at [facility] Urology: Recurrent Cystitis with positive cultures E. Coli.  2 in 6 months, 3 in 1 year. PT: SUI for Pelvic floor reinforcement.

## 2018-01-19 DIAGNOSIS — G4733 Obstructive sleep apnea (adult) (pediatric): Secondary | ICD-10-CM | POA: Diagnosis not present

## 2018-01-20 DIAGNOSIS — N3020 Other chronic cystitis without hematuria: Secondary | ICD-10-CM | POA: Diagnosis not present

## 2018-01-20 DIAGNOSIS — N393 Stress incontinence (female) (male): Secondary | ICD-10-CM | POA: Diagnosis not present

## 2018-01-20 NOTE — Telephone Encounter (Signed)
[facility] called patient and patient now scheduled today.

## 2018-01-20 NOTE — Telephone Encounter (Signed)
Spoke with [facility] and they said patient declined appointment at the time. Will call back to schedule.

## 2018-01-24 ENCOUNTER — Encounter: Payer: Self-pay | Admitting: Adult Health

## 2018-01-24 ENCOUNTER — Ambulatory Visit
Admission: RE | Admit: 2018-01-24 | Discharge: 2018-01-24 | Disposition: A | Discharge status: Home / Self Care | Payer: 59 | Source: Ambulatory Visit | Attending: Adult Health | Admitting: Adult Health

## 2018-01-24 DIAGNOSIS — R945 Abnormal results of liver function studies: Principal | ICD-10-CM

## 2018-01-24 DIAGNOSIS — R7989 Other specified abnormal findings of blood chemistry: Secondary | ICD-10-CM

## 2018-01-24 DIAGNOSIS — K760 Fatty (change of) liver, not elsewhere classified: Secondary | ICD-10-CM | POA: Diagnosis not present

## 2018-01-26 ENCOUNTER — Other Ambulatory Visit: Payer: Self-pay | Admitting: Internal Medicine

## 2018-01-31 DIAGNOSIS — N393 Stress incontinence (female) (male): Secondary | ICD-10-CM | POA: Diagnosis not present

## 2018-01-31 DIAGNOSIS — M6289 Other specified disorders of muscle: Secondary | ICD-10-CM | POA: Diagnosis not present

## 2018-01-31 DIAGNOSIS — M6281 Muscle weakness (generalized): Secondary | ICD-10-CM | POA: Diagnosis not present

## 2018-02-23 DIAGNOSIS — Z1231 Encounter for screening mammogram for malignant neoplasm of breast: Secondary | ICD-10-CM | POA: Diagnosis not present

## 2018-02-23 LAB — HM MAMMOGRAPHY

## 2018-02-24 DIAGNOSIS — M6281 Muscle weakness (generalized): Secondary | ICD-10-CM | POA: Diagnosis not present

## 2018-02-24 DIAGNOSIS — M6289 Other specified disorders of muscle: Secondary | ICD-10-CM | POA: Diagnosis not present

## 2018-02-24 DIAGNOSIS — N3941 Urge incontinence: Secondary | ICD-10-CM | POA: Diagnosis not present

## 2018-02-25 ENCOUNTER — Other Ambulatory Visit: Payer: Self-pay | Admitting: Physician Assistant

## 2018-03-03 ENCOUNTER — Encounter: Payer: Self-pay | Admitting: Physician Assistant

## 2018-03-03 MED ORDER — DOXYCYCLINE HYCLATE 100 MG PO CAPS: ORAL_CAPSULE | ORAL | 0 refills | Status: DC

## 2018-03-03 MED ORDER — PREDNISONE 20 MG PO TABS: ORAL_TABLET | ORAL | 0 refills | Status: DC

## 2018-03-03 MED ORDER — BENZONATATE 200 MG PO CAPS: 200.0000 mg | ORAL_CAPSULE | Freq: Three times a day (TID) | ORAL | 0 refills | Status: DC | PRN

## 2018-03-14 DIAGNOSIS — G4733 Obstructive sleep apnea (adult) (pediatric): Secondary | ICD-10-CM | POA: Diagnosis not present

## 2018-04-11 ENCOUNTER — Encounter: Payer: Self-pay | Admitting: Physician Assistant

## 2018-04-14 NOTE — Progress Notes (Signed)
Complete Physical  Assessment and Plan: Encounter for general adult medical examination with abnormal findings  Essential hypertension - continue medications, DASH diet, exercise and monitor at home. Call if greater than 130/80.  -     CBC with Differential/Platelet -     COMPLETE METABOLIC PANEL WITH GFR -     TSH -     Urinalysis, Routine w reflex microscopic -     Microalbumin / creatinine urine ratio -     EKG 12-Lead  Hyperlipidemia associated with type 2 diabetes mellitus (HCC) -     Lipid panel check lipids decrease fatty foods increase activity.   CKD stage 2 due to type 2 diabetes mellitus (HCC) Increase fluids, avoid NSAIDS, monitor sugars, will monitor -     COMPLETE METABOLIC PANEL WITH GFR -     Hemoglobin A1c  Type 2 diabetes mellitus with diabetic mononeuropathy, without long-term current use of insulin (HCC) -     Hemoglobin A1c Discussed general issues about diabetes pathophysiology and management., Educational material distributed., Suggested low cholesterol diet., Encouraged aerobic exercise., Discussed foot care., Reminded to get yearly retinal exam.  Fatty infiltration of liver -     COMPLETE METABOLIC PANEL WITH GFR Check labs, avoid tylenol, alcohol, weight loss advised.   Obesity (BMI 30.0-34.9) - follow up 3 months for progress monitoring - increase veggies, decrease carbs - long discussion about weight loss, diet, and exercise  Obstructive sleep apnea on CPAP Continue CPAP  Polyp of colon, unspecified part of colon, unspecified type Needs to get done  Chronic gout without tophus, unspecified cause, unspecified site Gout- recheck Uric acid as needed, Diet discussed, continue medications.  Vitamin D deficiency -     VITAMIN D 25 Hydroxy (Vit-D Deficiency, Fractures)  Stress incontinence in female Improved with PT  Anxiety -continue medications, stress management techniques discussed, increase water, good sleep hygiene discussed, increase  exercise, and increase veggies.   Medication management -     Magnesium  Screening, anemia, deficiency, iron -     Iron,Total/Total Iron Binding Cap -     Vitamin B12  Other orders -     phentermine (ADIPEX-P) 37.5 MG tablet; Take 1 tablet (37.5 mg total) by mouth daily before breakfast. -     losartan (COZAAR) 50 MG tablet; Take 1 tablet (50 mg total) by mouth daily.   Discussed med's effects and SE's. Screening labs and tests as requested with regular follow-up as recommended. Over 40 minutes of exam, counseling, chart review, and complex, high level critical decision making was performed this visit.   HPI  63 y.o. female  presents for a complete physical.  Sept loses insurance for 1.5 years until medicare.   Her blood pressure has been controlled at home, today their BP is BP: 122/86 She does not workout. She denies chest pain, shortness of breath, dizziness.  She is not on cholesterol medication and denies myalgias. Her cholesterol is at goal. The cholesterol last visit was:   Lab Results  Component Value Date   CHOL 148 01/03/2018   HDL 37 (L) 01/03/2018   LDLCALC 65 01/03/2018   TRIG 385 (H) 01/03/2018   CHOLHDL 4.0 01/03/2018   She has been working on diet and exercise for diabetes. Has had DM x 22 year, she is on bASA, she is on ACE/ARB, has been [place], christmas and then just got back on a cruise, states that her A1C will likely will not be good, she has not checked this past  week, She is on januiva 170m, syngardy 25/1000, 5071m2 a day, amaryl 4 mg 2 a day and denies paresthesia of the feet, polydipsia, polyuria and visual disturbances. Last A1C in the office was:  Lab Results  Component Value Date   HGBA1C 9.0 (H) 01/03/2018  Last GFR: Lab Results  Component Value Date   GFRNONAA 79 01/03/2018  Patient is on Vitamin D supplement.   Lab Results  Component Value Date   VD25OH 55 01/03/2018     BMI is Body mass index is 33.05 kg/m., she is working on diet  and exercise. Has not been on Phentermine but would like to get back on it, has stopped drinking soda but did drink on recent cruise and will drink 1 soda a Tuesday with tacos.  Wt Readings from Last 3 Encounters:  04/18/18 186 lb 9.6 oz (84.6 kg)  01/03/18 187 lb 9.6 oz (85.1 kg)  12/31/17 185 lb (83.9 kg)    Current Medications:  Current Outpatient Medications on File Prior to Visit  Medication Sig Dispense Refill  . allopurinol (ZYLOPRIM) 100 MG tablet TAKE 1 TABLET BY MOUTH EVERY DAY 90 tablet 1  . aspirin 81 MG tablet Take 81 mg by mouth daily.    . Blood Glucose Monitoring Suppl (ACCU-CHEK AVIVA PLUS) w/Device KIT Test sugar once daily 11.9 1 kit 0  . Cholecalciferol (VITAMIN D) 2000 UNITS tablet Take 2,000 Units by mouth 2 (two) times daily.    . Empagliflozin-metFORMIN HCl ER (SYNJARDY XR) 25-1000 MG TB24 Take 25 mg by mouth daily. 90 tablet 1  . gabapentin (NEURONTIN) 300 MG capsule Take 1 capsule (300 mg total) by mouth 3 (three) times daily. 270 capsule 2  . glucose blood (ACCU-CHEK AVIVA PLUS) test strip Test sugar once daily DX 11.9 50 each 12  . JANUVIA 100 MG tablet TAKE 1 TABLET BY MOUTH EVERY DAY 90 tablet 2  . lisinopril (PRINIVIL,ZESTRIL) 10 MG tablet TAKE 1 TABLET BY MOUTH EVERY DAY 90 tablet 1  . metFORMIN (GLUCOPHAGE-XR) 500 MG 24 hr tablet 1 TABLET BY MOUTH TWICE A DAY WITH LARGEST MEALS. 180 tablet 2  . Multiple Vitamin (MULTIVITAMIN) capsule Take 1 capsule by mouth daily.    . Omega-3 Fatty Acids (FISH OIL) 1000 MG CAPS Take by mouth daily.    . Marland Kitchenoxycycline (VIBRAMYCIN) 100 MG capsule Take 1 capsule twice daily with food (Patient not taking: Reported on 04/18/2018) 20 capsule 0   No current facility-administered medications on file prior to visit.    Health Maintenance:   Immunization History  Administered Date(s) Administered  . PPD Test 02/22/2013, 02/23/2014  . Td 02/09/2001  . Tdap 02/23/2011  . Zoster 02/09/2006   Tetanus: 2013 Pneumovax:  declines Prevnar 13: declines Flu vaccine: declines Zostavax: 2008  Pap: 2018 MGM: 02/2018 normal at [facility] DEXA: 2010 normal Colonoscopy: 2012 due 2018 Stress test 2018 Echo 2018  Last Eye Exam: Has OV Thursday  Dr. PaPricilla Riffleatient Care Team: McUnk PintoMD as PCP - General (Internal Medicine) DiCalvert CantorMD as Consulting Physician (Ophthalmology) MeRichmond CampbellMD as Consulting Physician (Gastroenterology) TaBrien FewMD as Consulting Physician (Obstetrics and Gynecology) KuDaryll BrodMD as Consulting Physician (Orthopedic Surgery) TaLavonna MonarchMD as Consulting Physician (Dermatology)  Medical History:  Past Medical History:  Diagnosis Date  . Anemia   . Anxiety   . Colon polyps   . Diabetes mellitus without complication (H[place]  . Gout   . Hypertriglyceridemia   . Obstructive sleep apnea on  CPAP    uses CPAP nightly  . 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 Clinoril [Sulindac]   . Erythromycin   . Shrimp [Shellfish Allergy]     SURGICAL HISTORY She  has a past surgical history that includes Abdominal hysterectomy; Breast surgery (Left); Ovary surgery (Left, 1999); achilles (Right, 2009); Eye surgery; Shoulder arthroscopy (Right, 2017); and Foot surgery (Left, 06/16/2017). FAMILY HISTORY Her family history includes Alcohol abuse in her father; COPD in her mother; Depression in her mother; Diabetes in her sister; Heart attack (age of onset: 79) in her father; Heart disease in her father. SOCIAL HISTORY She  reports that she has never smoked. She has never used smokeless tobacco. She reports that she does not drink alcohol or use drugs.  Review of Systems: Review of Systems  Constitutional: Negative.   HENT: Negative.   Eyes: Negative.   Respiratory: Negative.   Cardiovascular: Negative.   Gastrointestinal: Negative.   Genitourinary: Positive for frequency. Negative for  dysuria, flank pain, hematuria and urgency.       Stress incontinence  Musculoskeletal: Negative.   Skin: Negative.   Neurological: Negative.   Endo/Heme/Allergies: Positive for polydipsia. Negative for environmental allergies. Does not bruise/bleed easily.  Psychiatric/Behavioral: Negative.     Physical Exam: Estimated body mass index is 33.05 kg/m as calculated from the following:   Height as of this encounter: 5' 3" (1.6 m).   Weight as of this encounter: 186 lb 9.6 oz (84.6 kg). BP 122/86   Pulse 97   Temp (!) 97.3 F (36.3 C)   Ht 5' 3" (1.6 m)   Wt 186 lb 9.6 oz (84.6 kg)   SpO2 97%   BMI 33.05 kg/m  General Appearance: Well nourished, in no apparent distress.  Eyes: PERRLA, EOMs, conjunctiva no swelling or erythema, normal fundi and vessels.  Sinuses: No Frontal/maxillary tenderness  ENT/Mouth: Ext aud canals clear, normal light reflex with TMs without erythema, bulging. Good dentition. No erythema, swelling, or exudate on post pharynx. Tonsils not swollen or erythematous. Hearing normal.  Neck: Supple, thyroid normal. No bruits  Respiratory: Respiratory effort normal, BS equal bilaterally without rales, rhonchi, wheezing or stridor.  Cardio: RRR without murmurs, rubs or gallops. Brisk peripheral pulses without edema.  Chest: symmetric, with normal excursions and percussion.  Breasts: Symmetric, without lumps, nipple discharge, retractions.  Abdomen: Soft, nontender, no guarding, rebound, hernias, masses, or organomegaly.  Lymphatics: Non tender without lymphadenopathy.  Musculoskeletal: Full ROM all peripheral extremities,5/5 strength, and normal gait.  Skin: Warm, dry without rashes, lesions, ecchymosis. Neuro: Cranial nerves intact, reflexes equal bilaterally. Normal muscle tone, no cerebellar symptoms. Sensation intact.  Psych: Awake and oriented X 3, normal affect, Insight and Judgment appropriate.   EKG: WNL no ST changes. AORTA SCAN: defer  Vicie Mutters 3:35  PM [facility]

## 2018-04-18 ENCOUNTER — Ambulatory Visit (INDEPENDENT_AMBULATORY_CARE_PROVIDER_SITE_OTHER): Payer: 59 | Admitting: Physician Assistant

## 2018-04-18 ENCOUNTER — Encounter: Payer: Self-pay | Admitting: Physician Assistant

## 2018-04-18 VITALS — BP 122/86 | HR 97 | Temp 97.3°F | Ht 63.0 in | Wt 186.6 lb

## 2018-04-18 DIAGNOSIS — N393 Stress incontinence (female) (male): Secondary | ICD-10-CM

## 2018-04-18 DIAGNOSIS — Z79899 Other long term (current) drug therapy: Secondary | ICD-10-CM

## 2018-04-18 DIAGNOSIS — Z0000 Encounter for general adult medical examination without abnormal findings: Secondary | ICD-10-CM | POA: Diagnosis not present

## 2018-04-18 DIAGNOSIS — Z130 Encounter for screening for diseases of the blood and blood-forming organs and certain disorders involving the immune mechanism: Secondary | ICD-10-CM

## 2018-04-18 DIAGNOSIS — E1141 Type 2 diabetes mellitus with diabetic mononeuropathy: Secondary | ICD-10-CM | POA: Diagnosis not present

## 2018-04-18 DIAGNOSIS — K635 Polyp of colon: Secondary | ICD-10-CM

## 2018-04-18 DIAGNOSIS — E1122 Type 2 diabetes mellitus with diabetic chronic kidney disease: Secondary | ICD-10-CM | POA: Diagnosis not present

## 2018-04-18 DIAGNOSIS — M1A9XX0 Chronic gout, unspecified, without tophus (tophi): Secondary | ICD-10-CM

## 2018-04-18 DIAGNOSIS — I10 Essential (primary) hypertension: Secondary | ICD-10-CM | POA: Diagnosis not present

## 2018-04-18 DIAGNOSIS — Z0001 Encounter for general adult medical examination with abnormal findings: Secondary | ICD-10-CM

## 2018-04-18 DIAGNOSIS — E1169 Type 2 diabetes mellitus with other specified complication: Secondary | ICD-10-CM | POA: Diagnosis not present

## 2018-04-18 DIAGNOSIS — E119 Type 2 diabetes mellitus without complications: Secondary | ICD-10-CM

## 2018-04-18 DIAGNOSIS — E669 Obesity, unspecified: Secondary | ICD-10-CM

## 2018-04-18 DIAGNOSIS — E559 Vitamin D deficiency, unspecified: Secondary | ICD-10-CM

## 2018-04-18 DIAGNOSIS — Z136 Encounter for screening for cardiovascular disorders: Secondary | ICD-10-CM

## 2018-04-18 DIAGNOSIS — N182 Chronic kidney disease, stage 2 (mild): Secondary | ICD-10-CM

## 2018-04-18 DIAGNOSIS — G4733 Obstructive sleep apnea (adult) (pediatric): Secondary | ICD-10-CM

## 2018-04-18 DIAGNOSIS — K760 Fatty (change of) liver, not elsewhere classified: Secondary | ICD-10-CM

## 2018-04-18 DIAGNOSIS — Z9989 Dependence on other enabling machines and devices: Secondary | ICD-10-CM

## 2018-04-18 DIAGNOSIS — F419 Anxiety disorder, unspecified: Secondary | ICD-10-CM

## 2018-04-18 DIAGNOSIS — E785 Hyperlipidemia, unspecified: Secondary | ICD-10-CM

## 2018-04-18 MED ORDER — PHENTERMINE HCL 37.5 MG PO TABS: 37.5000 mg | ORAL_TABLET | Freq: Every day | ORAL | 2 refills | Status: DC

## 2018-04-18 MED ORDER — LOSARTAN POTASSIUM 50 MG PO TABS: 50.0000 mg | ORAL_TABLET | Freq: Every day | ORAL | 1 refills | Status: DC

## 2018-04-18 MED ORDER — GLIMEPIRIDE 4 MG PO TABS: 8.0000 mg | ORAL_TABLET | Freq: Every day | ORAL | 1 refills | Status: DC

## 2018-04-18 NOTE — Patient Instructions (Addendum)
Ask insurance and pharmacy about shingrix - new vaccine   Can go to [email or web address] for more information  Shingrix Vaccination  Two vaccines are licensed and recommended to prevent shingles in the [place] Zoster vaccine live (ZVL, Zostavax) has been in use since 2006. Recombinant zoster vaccine (RZV, Shingrix), has been in use since 2017 and is recommended by ACIP as the preferred shingles vaccine.  What Everyone Should Know about Shingles Vaccine (Shingrix) One of the Recommended Vaccines by Disease Shingles vaccination is the only way to protect against shingles and postherpetic neuralgia (PHN), the most common complication from shingles. CDC recommends that healthy adults 50 years and older get two doses of the shingles vaccine called Shingrix (recombinant zoster vaccine), separated by 2 to 6 months, to prevent shingles and the complications from the disease. Your doctor or pharmacist can give you Shingrix as a shot in your upper arm. Shingrix provides strong protection against shingles and PHN. Two doses of Shingrix is more than 90% effective at preventing shingles and PHN. Protection stays above 85% for at least the first four years after you get vaccinated. Shingrix is the preferred vaccine, over Zostavax (zoster vaccine live), a shingles vaccine in use since 2006. Zostavax may still be used to prevent shingles in healthy adults 60 years and older. For example, you could use Zostavax if a person is allergic to Shingrix, prefers Zostavax, or requests immediate vaccination and Shingrix is unavailable. Who Should Get Shingrix? Healthy adults 50 years and older should get two doses of Shingrix, separated by 2 to 6 months. You should get Shingrix even if in the past you . had shingles  . received Zostavax  . are not sure if you had chickenpox There is no maximum age for getting Shingrix. If you had shingles in the past, you can get Shingrix to  help prevent future occurrences of the disease. There is no specific length of time that you need to wait after having shingles before you can receive Shingrix, but generally you should make sure the shingles rash has gone away before getting vaccinated. You can get Shingrix whether or not you remember having had chickenpox in the past. Studies show that more than 99% of Americans 40 years and older have had chickenpox, even if they don't remember having the disease. Chickenpox and shingles are related because they are caused by the same virus (varicella zoster virus). After a person recovers from chickenpox, the virus stays dormant (inactive) in the body. It can reactivate years later and cause shingles. If you had Zostavax in the recent past, you should wait at least eight weeks before getting Shingrix. Talk to your healthcare provider to determine the best time to get Shingrix. Shingrix is available in [facility] and pharmacies. To find doctor's offices or pharmacies near you that offer the vaccine, visit HealthMap Vaccine FinderExternal. If you have questions about Shingrix, talk with your healthcare provider. Vaccine for Those 46 Years and Older  Shingrix reduces the risk of shingles and PHN by more than 90% in people 39 and older. CDC recommends the vaccine for healthy adults 65 and older.  Who Should Not Get Shingrix? You should not get Shingrix if you: . have ever had a severe allergic reaction to any component of the vaccine or after a dose of Shingrix  . tested negative for immunity to varicella zoster virus. If you test negative, you should get chickenpox vaccine.  . currently have shingles  . currently are pregnant or breastfeeding. Women who are  pregnant or breastfeeding should wait to get Shingrix.  Marland Kitchen receive specific antiviral drugs (acyclovir, famciclovir, or valacyclovir) 24 hours before vaccination (avoid use of these antiviral drugs for 14 days after vaccination)- zoster  vaccine live only If you have a minor acute (starts suddenly) illness, such as a cold, you may get Shingrix. But if you have a moderate or severe acute illness, you should usually wait until you recover before getting the vaccine. This includes anyone with a temperature of 101.32F or higher. The side effects of the Shingrix are temporary, and usually last 2 to 3 days. While you may experience pain for a few days after getting Shingrix, the pain will be less severe than having shingles and the complications from the disease. How Well Does Shingrix Work? Two doses of Shingrix provides strong protection against shingles and postherpetic neuralgia (PHN), the most common complication of shingles. . In adults 34 to 63 years old who got two doses, Shingrix was 97% effective in preventing shingles; among adults 70 years and older, Shingrix was 91% effective.  . In adults 19 to 63 years old who got two doses, Shingrix was 91% effective in preventing PHN; among adults 70 years and older, Shingrix was 89% effective. Shingrix protection remained high (more than 85%) in people 70 years and older throughout the four years following vaccination. Since your risk of shingles and PHN increases as you get older, it is important to have strong protection against shingles in your older years. Top of Page  What Are the Possible Side Effects of Shingrix? Studies show that Shingrix is safe. The vaccine helps your body create a strong defense against shingles. As a result, you are likely to have temporary side effects from getting the shots. The side effects may affect your ability to do normal daily activities for 2 to 3 days. Most people got a sore arm with mild or moderate pain after getting Shingrix, and some also had redness and swelling where they got the shot. Some people felt tired, had muscle pain, a headache, shivering, fever, stomach pain, or nausea. About 1 out of 6 people who got Shingrix experienced side effects  that prevented them from doing regular activities. Symptoms went away on their own in about 2 to 3 days. Side effects were more common in younger people. You might have a reaction to the first or second dose of Shingrix, or both doses. If you experience side effects, you may choose to take over-the-counter pain medicine such as ibuprofen or acetaminophen. If you experience side effects from Shingrix, you should report them to the Vaccine Adverse Event Reporting System (VAERS). Your doctor might file this report, or you can do it yourself through the VAERS websiteExternal, or by calling [phone]. If you have any questions about side effects from Shingrix, talk with your doctor. The shingles vaccine does not contain thimerosal (a preservative containing mercury). Top of Page  When Should I See a Doctor Because of the Side Effects I Experience From Shingrix? In clinical trials, Shingrix was not associated with serious adverse events. In fact, serious side effects from vaccines are extremely rare. For example, for every 1 million doses of a vaccine given, only one or two people may have a severe allergic reaction. Signs of an allergic reaction happen within minutes or hours after vaccination and include hives, swelling of the face and throat, difficulty breathing, a fast heartbeat, dizziness, or weakness. If you experience these or any other life-threatening symptoms, see a doctor right away.  Shingrix causes a strong response in your immune system, so it may produce short-term side effects more intense than you are used to from other vaccines. These side effects can be uncomfortable, but they are expected and usually go away on their own in 2 or 3 days. Top of Page  How Can I Pay For Shingrix? There are several ways shingles vaccine may be paid for: Medicare . Medicare Part D plans cover the shingles vaccine, but there may be a cost to you depending on your plan. There may be a copay for the vaccine,  or you may need to pay in full then get reimbursed for a certain amount.  . Medicare Part B does not cover the shingles vaccine. Medicaid . Medicaid may or may not cover the vaccine. Contact your insurer to find out. Private health insurance . Many private health insurance plans will cover the vaccine, but there may be a cost to you depending on your plan. Contact your insurer to find out. Vaccine assistance programs . Some pharmaceutical companies provide vaccines to eligible adults who cannot afford them. You may want to check with the vaccine manufacturer, [employer], about Shingrix. If you do not currently have health insurance, learn more about affordable health coverage optionsExternal. To find doctor's offices or pharmacies near you that offer the vaccine, visit HealthMap Vaccine FinderExternal.   Fatty liver or Nonalcoholic fatty liver disease (NASH)  Now the leading cause of liver failure in the [place].  It is normally from such risk factors as obesity, diabetes, insulin resistance, high cholesterol, or metabolic syndrome.  The only definitive therapy is weight loss and exercise.    Suggest walking 20-30 mins daily.  Decreasing carbohydrates, increasing veggies.  Vitamin E 800 IU a day may be beneficial. Liver cancer has been noted in patient with fatty liver without cirrhosis.  Will monitor closely   Fatty Liver Fatty liver is the accumulation of fat in liver cells. It is also called hepatosteatosis or steatohepatitis. It is normal for your liver to contain some fat. If fat is more than 5 to 10% of your liver's weight, you have fatty liver.  There are often no symptoms (problems) for years while damage is still occurring. People often learn about their fatty liver when they have medical tests for other reasons. Fat can damage your liver for years or even decades without causing problems. When it becomes severe, it can cause fatigue, weight loss, weakness, and  confusion. This makes you more likely to develop more serious liver problems. The liver is the largest organ in the body. It does a lot of work and often gives no warning signs when it is sick until late in a disease. The liver has many important jobs including:  Breaking down foods.  Storing vitamins, iron, and other minerals.  Making proteins.  Making bile for food digestion.  Breaking down many products including medications, alcohol and some poisons.  PROGNOSIS  Fatty liver may cause no damage or it can lead to an inflammation of the liver. This is, called steatohepatitis.  Over time the liver may become scarred and hardened. This condition is called cirrhosis. Cirrhosis is serious and may lead to liver failure or cancer. NASH is one of the leading causes of cirrhosis. About 10-20% of Americans have fatty liver and a smaller 2-5% has NASH.  TREATMENT   Weight loss, fat restriction, and exercise in overweight patients produces inconsistent results but is worth trying.  Good control of diabetes may reduce  fatty liver.  Eat a balanced, healthy diet.  Increase your physical activity.  There are no medical or surgical treatments for a fatty liver or NASH, but improving your diet and increasing your exercise may help prevent or reverse some of the damage.       Bad carbs also include fruit juice, alcohol, and sweet tea. These are empty calories that do not signal to your brain that you are full.   Please remember the good carbs are still carbs which convert into sugar. So please measure them out no more than 1/2-1 cup of rice, oatmeal, pasta, and beans  Veggies are however free foods! Pile them on.   Not all fruit is created equal. Please see the list below, the fruit at the bottom is higher in sugars than the fruit at the top. Please avoid all dried fruits.

## 2018-04-19 LAB — LIPID PANEL
Cholesterol: 199 mg/dL (ref <200)
HDL: 35 mg/dL — AB (ref >=50)
Non-HDL Cholesterol (Calc): 164 mg/dL (calc) — ABNORMAL HIGH (ref <130)
Total CHOL/HDL Ratio: 5.7 (calc) — ABNORMAL HIGH (ref <5.0)
Triglycerides: 536 mg/dL — ABNORMAL HIGH (ref <150)

## 2018-04-19 LAB — CBC WITH DIFFERENTIAL/PLATELET
Absolute Monocytes: 370 cells/uL (ref 200–950)
Basophils Absolute: 79 cells/uL (ref 0–200)
Basophils Relative: 1.2 %
Eosinophils Absolute: 264 cells/uL (ref 15–500)
Eosinophils Relative: 4 %
HCT: 43.9 % (ref 35.0–45.0)
Hemoglobin: 15.2 g/dL (ref 11.7–15.5)
Lymphs Abs: 1947 cells/uL (ref 850–3900)
MCH: 32.5 pg (ref 27.0–33.0)
MCHC: 34.6 g/dL (ref 32.0–36.0)
MCV: 93.8 fL (ref 80.0–100.0)
MPV: 10.9 fL (ref 7.5–12.5)
Monocytes Relative: 5.6 %
Neutro Abs: 3940 cells/uL (ref 1500–7800)
Neutrophils Relative %: 59.7 %
Platelets: 228 10*3/uL (ref 140–400)
RBC: 4.68 10*6/uL (ref 3.80–5.10)
RDW: 13.3 % (ref 11.0–15.0)
Total Lymphocyte: 29.5 %
WBC: 6.6 10*3/uL (ref 3.8–10.8)

## 2018-04-19 LAB — HEMOGLOBIN A1C
Hgb A1c MFr Bld: 9.5 % of total Hgb — ABNORMAL HIGH (ref <5.7)
Mean Plasma Glucose: 226 (calc)
eAG (mmol/L): 12.5 (calc)

## 2018-04-19 LAB — URINALYSIS, ROUTINE W REFLEX MICROSCOPIC
Bilirubin Urine: NEGATIVE
Hgb urine dipstick: NEGATIVE
Leukocytes,Ua: NEGATIVE
Nitrite: NEGATIVE
PROTEIN: NEGATIVE
Specific Gravity, Urine: 1.035 (ref 1.001–1.03)
pH: 6.5 (ref 5.0–8.0)

## 2018-04-19 LAB — VITAMIN B12: Vitamin B-12: 543 pg/mL (ref 200–1100)

## 2018-04-19 LAB — TSH: TSH: 0.6 mIU/L (ref 0.40–4.50)

## 2018-04-19 LAB — COMPLETE METABOLIC PANEL WITH GFR
AG Ratio: 1.8 (calc) (ref 1.0–2.5)
ALKALINE PHOSPHATASE (APISO): 85 U/L (ref 37–153)
ALT: 67 U/L — AB (ref 6–29)
AST: 88 U/L — AB (ref 10–35)
Albumin: 4.4 g/dL (ref 3.6–5.1)
BUN: 14 mg/dL (ref 7–25)
CO2: 28 mmol/L (ref 20–32)
Calcium: 10.3 mg/dL (ref 8.6–10.4)
Chloride: 103 mmol/L (ref 98–110)
Creat: 0.99 mg/dL (ref 0.50–0.99)
GFR, Est African American: 71 mL/min/{1.73_m2} (ref >=60)
GFR, Est Non African American: 61 mL/min/{1.73_m2} (ref >=60)
Globulin: 2.4 g/dL (calc) (ref 1.9–3.7)
Glucose, Bld: 164 mg/dL — ABNORMAL HIGH (ref 65–99)
Potassium: 4.2 mmol/L (ref 3.5–5.3)
Sodium: 141 mmol/L (ref 135–146)
Total Bilirubin: 0.4 mg/dL (ref 0.2–1.2)
Total Protein: 6.8 g/dL (ref 6.1–8.1)

## 2018-04-19 LAB — IRON, TOTAL/TOTAL IRON BINDING CAP
%SAT: 22 % (calc) (ref 16–45)
IRON: 82 ug/dL (ref 45–160)
TIBC: 373 mcg/dL (calc) (ref 250–450)

## 2018-04-19 LAB — VITAMIN D 25 HYDROXY (VIT D DEFICIENCY, FRACTURES): Vit D, 25-Hydroxy: 60 ng/mL (ref 30–100)

## 2018-04-19 LAB — MAGNESIUM: Magnesium: 2.2 mg/dL (ref 1.5–2.5)

## 2018-04-19 LAB — MICROALBUMIN / CREATININE URINE RATIO
Creatinine, Urine: 39 mg/dL (ref 20–275)
Microalb, Ur: <0.2 mg/dL

## 2018-04-20 ENCOUNTER — Encounter: Payer: Self-pay | Admitting: Physician Assistant

## 2018-05-09 ENCOUNTER — Other Ambulatory Visit: Payer: Self-pay | Admitting: Internal Medicine

## 2018-05-09 ENCOUNTER — Other Ambulatory Visit: Payer: Self-pay | Admitting: Physician Assistant

## 2018-05-09 DIAGNOSIS — M1A9XX0 Chronic gout, unspecified, without tophus (tophi): Secondary | ICD-10-CM

## 2018-05-09 DIAGNOSIS — E119 Type 2 diabetes mellitus without complications: Secondary | ICD-10-CM

## 2018-06-16 DIAGNOSIS — G4733 Obstructive sleep apnea (adult) (pediatric): Secondary | ICD-10-CM | POA: Diagnosis not present

## 2018-07-17 ENCOUNTER — Other Ambulatory Visit: Payer: Self-pay | Admitting: Physician Assistant

## 2018-07-20 ENCOUNTER — Other Ambulatory Visit: Payer: Self-pay | Admitting: Physician Assistant

## 2018-07-20 ENCOUNTER — Other Ambulatory Visit: Payer: Self-pay | Admitting: Internal Medicine

## 2018-07-21 NOTE — Progress Notes (Signed)
FOLLOW UP  Assessment and Plan:   Hypertension Well controlled with current medications  Monitor blood pressure at home; patient to call if consistently greater than 130/80 Continue DASH diet.   Reminder to go to the ER if any CP, SOB, nausea, dizziness, severe HA, changes vision/speech, left arm numbness and tingling and jaw pain.  Cholesterol Currently at LDL goal; trigs remain elevated discussed will start fenofibrate if over 500 Continue low cholesterol diet and exercise.  Check lipid panel.   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  Perform daily foot/skin check, notify office of any concerning changes.  Check A1C  Obesity with co morbidities Long discussion about weight loss, diet, and exercise Recommended diet heavy in fruits and veggies and low in animal meats, cheeses, and dairy products, appropriate calorie intake Discussed ideal weight for height  Patient to restart phentermine, reminded to take frequent drug breaks Cut out soda; switch to stevia sweetened Will follow up in 3 months  Vitamin D Def Continue supplementation Defer Vit D level  Gout Continue allopurinol Diet discussed Check uric acid as needed  Elevated LFTs Hasn't had hepatitis panel or abd imaging, check hepatitis to rule out, will follow up US  Suspect fatty liver  Continue diet and meds as discussed. Further disposition pending results of labs. Discussed med's effects and SE's.   Over 30 minutes of exam, counseling, chart review, and critical decision making was performed.   Future Appointments  Date Time Provider [place]  04/24/2019  3:00 PM Vicie Mutters, PA-C [id number] None    ----------------------------------------------------------------------------------------------------------------------  HPI 63 y.o. female  presents for 3 month follow up  on hypertension, cholesterol, diabetes, obesity, gout and vitamin D deficiency.   BMI is Body mass index is 32.66 kg/m., she is working on diet and exercise, starting to get back to the gym. She has been eating more at home, cutting down on fast food. She did not lose weight with phentermine.  Wt Readings from Last 3 Encounters:  07/25/18 184 lb 6.4 oz (83.6 kg)  04/18/18 186 lb 9.6 oz (84.6 kg)  01/03/18 187 lb 9.6 oz (85.1 kg)   Her blood pressure has been controlled at home, today their BP is BP: 122/84  She does workout. She denies chest pain, shortness of breath, dizziness.   She is not on cholesterol medication due to excellent control with fish oil supplement. Her LDL cholesterol is at goal. Trigs remain elevated. She does admit to some soda intake recently. The cholesterol last visit was:    Lab Results  Component Value Date   CHOL 199 04/18/2018   HDL 35 (L) 04/18/2018   [place]  04/18/2018     Comment:     . LDL cholesterol not calculated. Triglyceride levels greater than 400 mg/dL invalidate calculated LDL results. . Reference range: <100 . Desirable range <100 mg/dL for primary prevention;   <70 mg/dL for patients with CHD or diabetic patients  with > or = 2 CHD risk factors. Marland Kitchen LDL-C is now calculated using the Martin-Hopkins  calculation, which is a validated novel method providing  better accuracy than the Friedewald equation in the  estimation of LDL-C.  Cresenciano Genre et al. Annamaria Helling. 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   Last A1C in the office  was:  Lab Results  Component Value Date   HGBA1C 9.5 (H) 04/18/2018   Lab Results  Component Value Date   GFRNONAA 61 04/18/2018   Patient is on Vitamin D supplement.   Lab Results  Component Value Date   VD25OH 60 04/18/2018    Patient is on allopurinol for gout and does not report a recent flare.  Lab Results  Component Value Date   LABURIC 6.0 04/05/2017     Current Medications:  Current Outpatient Medications on File Prior to Visit  Medication Sig  . ACCU-CHEK AVIVA PLUS test strip TEST SUGAR ONCE DAILY DX 11.9  . allopurinol (ZYLOPRIM) 100 MG tablet TAKE 1 TABLET BY MOUTH EVERY DAY  . aspirin 81 MG tablet Take 81 mg by mouth daily.  . Blood Glucose Monitoring Suppl (ACCU-CHEK AVIVA PLUS) w/Device KIT Test sugar once daily 11.9  . Cholecalciferol (VITAMIN D) 2000 UNITS tablet Take 2,000 Units by mouth 2 (two) times daily.  . Empagliflozin-metFORMIN HCl ER (SYNJARDY XR) 25-1000 MG TB24 Take 25 mg by mouth daily.  . gabapentin (NEURONTIN) 300 MG capsule TAKE 1 CAPSULE BY MOUTH THREE TIMES A DAY  . glimepiride (AMARYL) 4 MG tablet TAKE 1 TABLET BY MOUTH DAILY WITH BREAKFAST  . JANUVIA 100 MG tablet TAKE 1 TABLET BY MOUTH EVERY DAY  . lisinopril (ZESTRIL) 10 MG tablet Take 1 tablet Daily for BP  . losartan (COZAAR) 50 MG tablet Take 1 tablet (50 mg total) by mouth daily.  . metFORMIN (GLUCOPHAGE-XR) 500 MG 24 hr tablet 1 TABLET BY MOUTH TWICE A DAY WITH LARGEST MEALS.  . Multiple Vitamin (MULTIVITAMIN) capsule Take 1 capsule by mouth daily.  . Omega-3 Fatty Acids (FISH OIL) 1000 MG CAPS Take by mouth daily.  . phentermine (ADIPEX-P) 37.5 MG tablet Take 1 tablet (37.5 mg total) by mouth daily before breakfast.   No current facility-administered medications on file prior to visit.      Allergies:  Allergies  Allergen Reactions  . Clinoril [Sulindac]   . Erythromycin   . Shrimp [Shellfish Allergy]      Medical History:  Past Medical History:  Diagnosis Date  .  Anemia   . Anxiety   . Colon polyps   . Diabetes mellitus without complication (HCC)   . Gout   . Hypertriglyceridemia   . Obstructive sleep apnea on CPAP    uses CPAP nightly  . 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  HENT: Negative for hearing loss and tinnitus.   Eyes: Negative for blurred vision and double vision.  Respiratory: Negative for cough, shortness of breath and wheezing.   Cardiovascular: Negative for chest pain, palpitations, orthopnea, claudication and leg swelling.  Gastrointestinal: Negative for abdominal pain, blood in stool, constipation, diarrhea, heartburn, melena, nausea and vomiting.  Genitourinary: Negative.   Musculoskeletal: Negative for joint pain and myalgias.  Skin: Negative for rash.  Neurological: Negative for dizziness, tingling, sensory change, weakness and headaches.  Endo/Heme/Allergies: Negative for polydipsia.  Psychiatric/Behavioral: Negative.   All other systems reviewed and are negative.      Physical Exam: BP 122/84   Pulse 83   Temp 97.6 F (36.4 C)   Ht 5' 3" (1.6 m)   Wt 184 lb 6.4 oz (83.6 kg)   SpO2 96%   BMI 32.66 kg/m  Wt Readings from Last 3 Encounters:  07/25/18 184 lb 6.4 oz (83.6 kg)  04/18/18 186 lb 9.6 oz (84.6 kg)  01/03/18 187 lb 9.6 oz (85.1 kg)   General Appearance: Well nourished, in no apparent distress. Eyes: PERRLA, EOMs, conjunctiva no swelling or erythema Sinuses: No Frontal/maxillary tenderness ENT/Mouth: Ext aud canals clear, TMs without erythema, bulging. No erythema, swelling, or exudate on post pharynx.  Tonsils not swollen or erythematous. Hearing normal.  Neck: Supple, thyroid normal.  Respiratory: Respiratory effort normal, BS equal bilaterally without rales, rhonchi, wheezing or stridor.   Cardio: RRR with no MRGs. Brisk peripheral pulses without edema.  Abdomen: Soft, + BS.  Non tender, no guarding, rebound, hernias, masses. Lymphatics: Non tender without lymphadenopathy.  Musculoskeletal: Full ROM, 5/5 strength, Normal gait Skin: Warm, dry without rashes, lesions, ecchymosis.  Neuro: Cranial nerves intact. No cerebellar symptoms.  Psych: Awake and oriented X 3, normal affect, Insight and Judgment appropriate.    Vicie Mutters, PA-C 9:59 AM [facility]

## 2018-07-25 ENCOUNTER — Ambulatory Visit: Payer: 59 | Admitting: Physician Assistant

## 2018-07-25 ENCOUNTER — Encounter: Payer: Self-pay | Admitting: Physician Assistant

## 2018-07-25 ENCOUNTER — Other Ambulatory Visit: Payer: Self-pay

## 2018-07-25 VITALS — BP 122/84 | HR 83 | Temp 97.6°F | Ht 63.0 in | Wt 184.4 lb

## 2018-07-25 DIAGNOSIS — E785 Hyperlipidemia, unspecified: Secondary | ICD-10-CM

## 2018-07-25 DIAGNOSIS — E1122 Type 2 diabetes mellitus with diabetic chronic kidney disease: Secondary | ICD-10-CM

## 2018-07-25 DIAGNOSIS — E1169 Type 2 diabetes mellitus with other specified complication: Secondary | ICD-10-CM

## 2018-07-25 DIAGNOSIS — I10 Essential (primary) hypertension: Secondary | ICD-10-CM

## 2018-07-25 DIAGNOSIS — E1141 Type 2 diabetes mellitus with diabetic mononeuropathy: Secondary | ICD-10-CM

## 2018-07-25 DIAGNOSIS — M1A9XX0 Chronic gout, unspecified, without tophus (tophi): Secondary | ICD-10-CM

## 2018-07-25 DIAGNOSIS — E669 Obesity, unspecified: Secondary | ICD-10-CM

## 2018-07-25 DIAGNOSIS — N182 Chronic kidney disease, stage 2 (mild): Secondary | ICD-10-CM

## 2018-07-25 MED ORDER — PHENTERMINE HCL 37.5 MG PO TABS: 37.5000 mg | ORAL_TABLET | Freq: Every day | ORAL | 2 refills | Status: DC

## 2018-07-25 NOTE — Patient Instructions (Signed)
    When it comes to diets, agreement about the perfect plan isn't easy to find, even among the experts. Experts at the [employer] developed an idea known as the Healthy Eating Plate. Just imagine a plate divided into logical, healthy portions.  The emphasis is on diet quality:  Load up on vegetables and fruits - one-half of your plate: Aim for color and variety, and remember that potatoes don't count.  Go for whole grains - one-quarter of your plate: Whole wheat, barley, wheat berries, quinoa, oats, brown rice, and foods made with them. If you want pasta, go with whole wheat pasta.  Protein power - one-quarter of your plate: Fish, chicken, beans, and nuts are all healthy, versatile protein sources. Limit red meat.  The diet, however, does go beyond the plate, offering a few other suggestions.  Use healthy plant oils, such as olive, canola, soy, corn, sunflower and peanut. Check the labels, and avoid partially hydrogenated oil, which have unhealthy trans fats.  If you're thirsty, drink water. Coffee and tea are good in moderation, but skip sugary drinks and limit milk and dairy products to one or two daily servings.  The type of carbohydrate in the diet is more important than the amount. Some sources of carbohydrates, such as vegetables, fruits, whole grains, and beans--are healthier than others.  Finally, stay active.  

## 2018-07-26 LAB — COMPLETE METABOLIC PANEL WITH GFR
AG Ratio: 2.2 (calc) (ref 1.0–2.5)
ALT: 46 U/L — ABNORMAL HIGH (ref 6–29)
AST: 41 U/L — ABNORMAL HIGH (ref 10–35)
Albumin: 4.4 g/dL (ref 3.6–5.1)
Alkaline phosphatase (APISO): 73 U/L (ref 37–153)
BUN: 13 mg/dL (ref 7–25)
CO2: 26 mmol/L (ref 20–32)
Calcium: 10.2 mg/dL (ref 8.6–10.4)
Chloride: 105 mmol/L (ref 98–110)
Creat: 0.72 mg/dL (ref 0.50–0.99)
GFR, Est African American: 104 mL/min/{1.73_m2} (ref >=60)
GFR, Est Non African American: 90 mL/min/{1.73_m2} (ref >=60)
Globulin: 2 g/dL (calc) (ref 1.9–3.7)
Glucose, Bld: 148 mg/dL — ABNORMAL HIGH (ref 65–99)
Potassium: 4.3 mmol/L (ref 3.5–5.3)
Sodium: 142 mmol/L (ref 135–146)
Total Bilirubin: 0.5 mg/dL (ref 0.2–1.2)
Total Protein: 6.4 g/dL (ref 6.1–8.1)

## 2018-07-26 LAB — CBC WITH DIFFERENTIAL/PLATELET
Absolute Monocytes: 300 cells/uL (ref 200–950)
Basophils Absolute: 78 cells/uL (ref 0–200)
Basophils Relative: 1.3 %
Eosinophils Absolute: 432 cells/uL (ref 15–500)
Eosinophils Relative: 7.2 %
HCT: 43.5 % (ref 35.0–45.0)
Hemoglobin: 14.3 g/dL (ref 11.7–15.5)
Lymphs Abs: 1740 cells/uL (ref 850–3900)
MCH: 31.2 pg (ref 27.0–33.0)
MCHC: 32.9 g/dL (ref 32.0–36.0)
MCV: 94.8 fL (ref 80.0–100.0)
MPV: 10.8 fL (ref 7.5–12.5)
Monocytes Relative: 5 %
Neutro Abs: 3450 cells/uL (ref 1500–7800)
Neutrophils Relative %: 57.5 %
Platelets: 189 10*3/uL (ref 140–400)
RBC: 4.59 10*6/uL (ref 3.80–5.10)
RDW: 12.6 % (ref 11.0–15.0)
Total Lymphocyte: 29 %
WBC: 6 10*3/uL (ref 3.8–10.8)

## 2018-07-26 LAB — HEMOGLOBIN A1C
Hgb A1c MFr Bld: 7.7 % of total Hgb — ABNORMAL HIGH (ref <5.7)
Mean Plasma Glucose: 174 (calc)
eAG (mmol/L): 9.7 (calc)

## 2018-07-26 LAB — LIPID PANEL
Cholesterol: 149 mg/dL (ref <200)
HDL: 36 mg/dL — ABNORMAL LOW (ref >=50)
LDL Cholesterol (Calc): 75 mg/dL (calc)
Non-HDL Cholesterol (Calc): 113 mg/dL (calc) (ref <130)
Total CHOL/HDL Ratio: 4.1 (calc) (ref <5.0)
Triglycerides: 312 mg/dL — ABNORMAL HIGH (ref <150)

## 2018-07-26 LAB — TSH: TSH: 0.52 mIU/L (ref 0.40–4.50)

## 2018-08-23 ENCOUNTER — Other Ambulatory Visit: Payer: Self-pay | Admitting: Physician Assistant

## 2018-08-27 ENCOUNTER — Other Ambulatory Visit: Payer: Self-pay | Admitting: Physician Assistant

## 2018-08-27 ENCOUNTER — Other Ambulatory Visit: Payer: Self-pay | Admitting: Adult Health

## 2018-08-27 ENCOUNTER — Other Ambulatory Visit: Payer: Self-pay | Admitting: Internal Medicine

## 2018-08-27 DIAGNOSIS — E1141 Type 2 diabetes mellitus with diabetic mononeuropathy: Secondary | ICD-10-CM

## 2018-09-03 DIAGNOSIS — R079 Chest pain, unspecified: Secondary | ICD-10-CM

## 2018-09-03 DIAGNOSIS — G589 Mononeuropathy, unspecified: Secondary | ICD-10-CM

## 2018-09-05 ENCOUNTER — Other Ambulatory Visit: Payer: Self-pay | Admitting: Physician Assistant

## 2018-09-05 DIAGNOSIS — G589 Mononeuropathy, unspecified: Secondary | ICD-10-CM

## 2018-09-07 ENCOUNTER — Ambulatory Visit
Admission: RE | Admit: 2018-09-07 | Discharge: 2018-09-07 | Disposition: A | Discharge status: Home / Self Care | Payer: 59 | Source: Ambulatory Visit | Attending: Physician Assistant | Admitting: Physician Assistant

## 2018-09-07 ENCOUNTER — Other Ambulatory Visit: Payer: Self-pay

## 2018-09-07 DIAGNOSIS — G589 Mononeuropathy, unspecified: Secondary | ICD-10-CM

## 2018-09-27 ENCOUNTER — Other Ambulatory Visit: Payer: Self-pay

## 2018-09-27 ENCOUNTER — Ambulatory Visit (INDEPENDENT_AMBULATORY_CARE_PROVIDER_SITE_OTHER): Payer: 59 | Admitting: Neurology

## 2018-09-27 ENCOUNTER — Encounter: Payer: Self-pay | Admitting: Neurology

## 2018-09-27 VITALS — BP 123/79 | HR 80 | Temp 97.3°F | Ht 63.0 in | Wt 186.0 lb

## 2018-09-27 DIAGNOSIS — M5412 Radiculopathy, cervical region: Secondary | ICD-10-CM | POA: Diagnosis not present

## 2018-09-27 NOTE — Patient Instructions (Addendum)
Dr. 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    Cervical Radiculopathy  Cervical radiculopathy happens when a nerve in the neck (a cervical nerve) is pinched or bruised. This condition can happen because of an injury to the cervical spine (vertebrae) in the neck, or as part of the normal aging process. Pressure on the cervical nerves can cause pain or numbness that travels from the neck all the way down into the arm and fingers. Usually, this condition gets better with rest. Treatment may be needed if the condition does not improve. What are the causes? This condition may be caused by:  A neck injury.  A bulging (herniated) disk.  Muscle spasms.  Muscle tightness in the neck because of overuse.  Arthritis.  Breakdown or degeneration in the bones and joints of the spine (spondylosis) due to aging.  Bone spurs that may develop near the cervical nerves. What are the signs or symptoms? Symptoms of this condition include:  Pain. The pain may travel from the neck to the arm and hand. The pain can be severe or irritating. It may be worse when you move your neck.  Numbness or tingling in your arm or hand.  Weakness in the affected arm and hand, in severe cases. How is this diagnosed? This condition may be diagnosed based on your symptoms, your medical history, and a physical exam. You may also have tests, including:  X-rays.  A CT scan.  An MRI.  An electromyogram (EMG).  Nerve conduction tests. How is this treated? In many cases, treatment is not needed for this condition. With rest, the condition usually gets better over time. If treatment is needed,  options may include:  Wearing a soft neck collar (cervical collar) for short periods of time, as told by your health care provider.  Doing physical therapy to strengthen your neck muscles.  Taking medicines, such as NSAIDs or oral corticosteroids.  Having spinal injections, in severe cases.  Having surgery. This may be needed if other treatments do not help. Different types of surgery may be done depending on the cause of this condition. Follow these instructions at home: If you have a cervical collar:  Wear it as told by your health care provider. Remove it only as told by your health care provider.  Ask your health care provider if you can remove the collar for cleaning and bathing. If you are allowed to remove the collar for cleaning or bathing: ? Follow instructions from your health care provider about how to remove the collar safely. ? Clean the collar by wiping it with mild soap and water and drying it completely. ? Take out any removable pads in the collar every 1-2 days, and wash them by hand with soap and water. Let them air-dry completely before you put them back in the collar. ? Check your skin under the collar for irritation or sores. If you see any, tell your health care provider. Managing pain      Take over-the-counter and prescription medicines only as told by your health care provider.  If directed, put ice on the affected area. ? If you have a soft neck collar, remove it as told by your  health care provider. ? Put ice in a plastic bag. ? Place a towel between your skin and the bag. ? Leave the ice on for 20 minutes, 2-3 times a day.  If applying ice does not help, you can try using heat. Use the heat source that your health care provider recommends, such as a moist heat pack or a heating pad. ? Place a towel between your skin and the heat source. ? Leave the heat on for 20-30 minutes. ? Remove the heat if your skin turns bright red. This is especially important  if you are unable to feel pain, heat, or cold. You may have a greater risk of getting burned.  Try a gentle neck and shoulder massage to help relieve symptoms. Activity  Rest as needed.  Return to your normal activities as told by your health care provider. Ask your health care provider what activities are safe for you.  Do stretching and strengthening exercises as told by your health care provider or physical therapist.  Do not lift anything that is heavier than 10 lb (4.5 kg) until your health care provider tells you that it is safe. General instructions  Use a flat pillow when you sleep.  Do not drive while wearing a cervical collar. If you do not have a cervical collar, ask your health care provider if it is safe to drive while your neck heals.  Ask your health care provider if the medicine prescribed to you requires you to avoid driving or using heavy machinery.  Do not use any products that contain nicotine or tobacco, such as cigarettes, e-cigarettes, and chewing tobacco. These can delay healing. If you need help quitting, ask your health care provider.  Keep all follow-up visits as told by your health care provider. This is important. Contact a health care provider if:  Your condition does not improve with treatment. Get help right away if:  Your pain gets much worse and cannot be controlled with medicines.  You have weakness or numbness in your hand, arm, face, or leg.  You have a high fever.  You have a stiff, rigid neck.  You lose control of your bowels or your bladder (have incontinence).  You have trouble with walking, balance, or speaking. Summary  Cervical radiculopathy happens when a nerve in the neck is pinched or bruised.  A nerve can get pinched from a bulging disk, arthritis, muscle spasms, or an injury to the neck.  Symptoms include pain, tingling, or numbness radiating from the neck into the arm or hand. Weakness can also occur in severe cases.   Treatment may include rest, wearing a cervical collar, and physical therapy. Medicines may be prescribed to help with pain. In severe cases, injections or surgery may be needed. This information is not intended to replace advice given to you by your health care provider. Make sure you discuss any questions you have with your health care provider. Document Released: 10/21/2000 Document Revised: 12/17/2017 Document Reviewed: 12/17/2017 Elsevier Patient Education  [place].  Epidural Steroid Injection Patient Information  Description: The epidural space surrounds the nerves as they exit the spinal cord.  In some patients, the nerves can be compressed and inflamed by a bulging disc or a tight spinal canal (spinal stenosis).  By injecting steroids into the epidural space, we can bring irritated nerves into direct contact with a potentially helpful medication.  These steroids act directly on the irritated nerves and can reduce swelling and inflammation which often leads to  decreased pain.  Epidural steroids may be injected anywhere along the spine and from the neck to the low back depending upon the location of your pain.   After numbing the skin with local anesthetic (like Novocaine), a small needle is passed into the epidural space slowly.  You may experience a sensation of pressure while this is being done.  The entire block usually last less than 10 minutes.  Conditions which may be treated by epidural steroids:  Low back and leg pain Neck and arm pain Spinal stenosis Post-laminectomy syndrome Herpes zoster (shingles) pain Pain from compression fractures  Preparation for the injection:  Do not eat any solid food or dairy products within 8 hours of your appointment.  You may drink clear liquids up to 3 hours before appointment.  Clear liquids include water, black coffee, juice or soda.  No milk or cream please. You may take your regular medication, including pain medications, with a  sip of water before your appointment  Diabetics should hold regular insulin (if taken separately) and take 1/2 normal NPH dos the morning of the procedure.  Carry some sugar containing items with you to your appointment. A driver must accompany you and be prepared to drive you home after your procedure.  Bring all your current medications with your. An IV may be inserted and sedation may be given at the discretion of the physician.   A blood pressure cuff, EKG and other monitors will often be applied during the procedure.  Some patients may need to have extra oxygen administered for a short period. You will be asked to provide medical information, including your allergies, prior to the procedure.  We must know immediately if you are taking blood thinners (like Coumadin/Warfarin)  Or if you are allergic to IV iodine contrast (dye). We must know if you could possible be pregnant.  Possible side-effects: Bleeding from needle site Infection (rare, may require surgery) Nerve injury (rare) Numbness & tingling (temporary) Difficulty urinating (rare, temporary) Spinal headache ( a headache worse with upright posture) Light -headedness (temporary) Pain at injection site (several days) Decreased blood pressure (temporary) Weakness in arm/leg (temporary) Pressure sensation in back/neck (temporary)  Call if you experience: Fever/chills associated with headache or increased back/neck pain. Headache worsened by an upright position. New onset weakness or numbness of an extremity below the injection site Hives or difficulty breathing (go to the emergency room) Inflammation or drainage at the infection site Severe back/neck pain Any new symptoms which are concerning to you  Please note:  Although the local anesthetic injected can often make your back or neck feel good for several hours after the injection, the pain will likely return.  It takes 3-7 days for steroids to work in the epidural space.  You  may not notice any pain relief for at least that one week.  If effective, we will often do a series of three injections spaced 3-6 weeks apart to maximally decrease your pain.  After the initial series, we generally will wait several months before considering a repeat injection of the same type.  If you have any questions, please call [phone] [facility]

## 2018-09-27 NOTE — Progress Notes (Signed)
[facility]    Provider:  Dr Jaynee Eagles Requesting Provider: Vicie Mutters, PA-C Primary Care Provider:  Unk Pinto, MD  CC:  Pain stabbing in the back through the chest  HPI:  Amanda Cooper is a 63 y.o. female here as requested by Unk Pinto, MD for mononeuropathy. 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  For a few years she is feeling a stabbing pain in the left shoulder blade, (point to high thoracic/low cervical area on the left around the scapula rhomboid/levator muscles). Pain in the right shoulder was different and it was treated by ortho had surgery and better on the right. In 2019 stared being being more chest area and had extensive cardiology workup, stress test etc. They increased her gabapentin bc pain is more intense, waking up at night. Felt like a rush of pain (points to the left pectoral) like a wave going across which has stopped. But the pain in the neck/back area is worse now with laying down or if she sits in a certain position, left side, pain spreading from the ribs along the pectoral and in the back as above. She has had costochondritis in the past. She has had mammograms. She feels she has weakness in the left arm and the pain runs into her left arm, hand goes asleep. Her whole hand gets numb not any particular fingers. Pain runs into the pecs down the arm ventrally and to the hand. No other focal neurologic deficits, associated symptoms, inciting events or modifiable factors.  Reviewed notes, labs and imaging from outside physicians, which showed:  XR cervical and thoracic spine 09/07/2018: Osteoarthritic change at several levels, most notably at C4-5 and C5-6. No fracture or spondylolisthesis. XR Thoracic:  IMPRESSION: Slight disc space narrowing at several levels. No fracture or spondylolisthesis.  I reviewed notes from requesting provider Vicie Mutters, PA-C, patient was seen in March of this year at which time CBC, CMP, TSH and other labs were ordered including lipid panel and hemoglobin A1c, B12, iron labs.  She was also seen in June of this year at which time they discussed diabetic mononeuropathy, she is on multiple medications for her diabetes, diet and exercise were reviewed, daily foot skin check was advised, and A1c was performed again.  She has established neuropathy on gabapentin.  I reviewed examinations which were normal including neurologic exam.  I reviewed MRI of the lumbar spine which was last completed in February 2001, reportedly I do not have the images.  L1-L2 mild to moderate disc degeneration is seen with mild posterior spurring and a small left paracentral HNP, unchanged from the previous study, L2-L3 mild disc degeneration, L3-L4 moderate disc degeneration with bulging of the disc there is a small left foraminal and extraforaminal disc herniation which is unchanged, mild facet arthropathy.  L4-L5 mild to moderate disc degeneration is seen with disc bulging and a left foraminal and extraforaminal HNP, also unchanged from prior exam, mild facet arthropathy.  L5-S1: Mild disc degeneration, mild facet arthropathy without HNP.  Overall there was no change from MRI of the lumbar spine completed approximately 1 year prior.  I reviewed MRI of the brain images which was largely unremarkable, quite minimal white matter changes/chronic ischemic microvascular disease.  Hemoglobin A1c 2 months ago was 7.7 however has been higher, 9.5 five months ago.  TSH within normal limits.  B12 5 months ago was 543 however 2 years ago was slightly lower at 348.  CMP in June showed BUN of 13 and creatinine 0.72, slightly elevated AST and ALT, CBC was normal.  Review of Systems: Patient complains of symptoms  per HPI as well as the following symptoms: imbalance. Pertinent negatives and positives per HPI. All others negative.   Social History   Socioeconomic History   Marital status: Married    Spouse name: Not on file   Number of children: 2   Years of education: Not on file   Highest education level: Not on file  Occupational History   Occupation: HR  Social Needs   Emergency planning/management officer strain: Not on file   Food insecurity    Worry: Not on file    Inability: Not on file   Transportation needs    Medical: Not on file    Non-medical: Not on file  Tobacco Use   Smoking status: Never Smoker   Smokeless tobacco: Never Used  Substance and Sexual Activity   Alcohol use: No   Drug use: No   Sexual activity: Yes    Partners: Male    Comment: 1st intercourse- 45, partners- 1, married- 52 yrs   Lifestyle   Physical activity    Days per week: Not on file    Minutes per session: Not on file   Stress: Not on file  Relationships   Social connections    Talks on phone: Not on file    Gets together: Not on file    Attends religious service: Not on file    Active member of club or organization: Not on file    Attends meetings of clubs or organizations: Not on file    Relationship status: Not on file   Intimate partner violence    Fear of current or ex partner: Not on file    Emotionally abused: Not on file    Physically abused: Not on file    Forced sexual activity: Not on file  Other Topics Concern   Not on file  Social History Narrative   Lives at home with husband.     Right handed   Caffeine: 1 cup of coffee in the morning and 1 soda every other day. Is stopping soda currently.    Family History  Problem Relation Age of Onset   Heart disease Father    Alcohol abuse Father    Heart attack Father 95       died age 3   Diabetes Sister    COPD Mother    Depression Mother    Neuropathy Neg Hx     Past Medical History:  Diagnosis Date   Anemia      Anxiety    Colon polyps    Diabetes mellitus without complication (HCC)    Gout    Hypertriglyceridemia    Obstructive sleep apnea on CPAP    uses CPAP nightly   Secondary adhesive capsulitis of right shoulder 10/10/2015    Patient Active Problem List   Diagnosis Date Noted   Fatty infiltration of liver 01/24/2018   Elevated LFTs 09/26/2017   Diabetes mellitus with diabetic mononeuropathy ([place]) 06/04/2017   Obesity (BMI 30.0-34.9) 10/02/2016   Stress incontinence in female 02/28/2016   Secondary adhesive capsulitis of right shoulder 10/10/2015   Vitamin D deficiency 11/15/2014   Gout    Obstructive sleep apnea on CPAP    Essential hypertension 01/03/2014   Colon  polyps    Hyperlipidemia associated with type 2 diabetes mellitus ([place])    CKD stage 2 due to type 2 diabetes mellitus ([place])    Anxiety     Past Surgical History:  Procedure Laterality Date   ABDOMINAL HYSTERECTOMY     achilles Right 2009   Bednarz- reattached achilles/ removed spur/ lengthened calf   BREAST SURGERY Left    asp cyst   EYE SURGERY     FOOT SURGERY Left 06/16/2017   bone spur excision, Dr. Noemi Chapel   OVARY SURGERY Left 1999   SHOULDER ARTHROSCOPY Right 2017    Current Outpatient Medications  Medication Sig Dispense Refill   ACCU-CHEK AVIVA PLUS test strip TEST SUGAR ONCE DAILY DX 11.9 100 each 3   Accu-Chek Softclix Lancets lancets Test Blood Sugar Daily (Dx: E11.9) 100 each 3   allopurinol (ZYLOPRIM) 100 MG tablet TAKE 1 TABLET BY MOUTH EVERY DAY 90 tablet 1   aspirin 81 MG tablet Take 81 mg by mouth daily.     Blood Glucose Monitoring Suppl (ACCU-CHEK AVIVA PLUS) w/Device KIT Test sugar once daily 11.9 1 kit 0   Cholecalciferol (VITAMIN D) 2000 UNITS tablet Take 2,000 Units by mouth 2 (two) times daily.     Empagliflozin-metFORMIN HCl ER (SYNJARDY XR) 25-1000 MG TB24 Take 1 tablet by mouth daily. Take 1 tablet Daily for Diabetes 90 tablet 1   gabapentin  (NEURONTIN) 300 MG capsule TAKE 1 CAPSULE BY MOUTH THREE TIMES A DAY (Patient taking differently: 1 capsule in the morning, 2 capsules at lunch, and 3 capsules at bedtime.) 270 capsule 2   glimepiride (AMARYL) 4 MG tablet TAKE 1 TABLET BY MOUTH DAILY WITH BREAKFAST (Patient taking differently: 8 mg. ) 90 tablet 1   JANUVIA 100 MG tablet TAKE 1 TABLET BY MOUTH EVERY DAY 90 tablet 2   losartan (COZAAR) 50 MG tablet TAKE 1 TABLET BY MOUTH EVERY DAY 90 tablet 1   metFORMIN (GLUCOPHAGE-XR) 500 MG 24 hr tablet 1 TABLET BY MOUTH TWICE A DAY WITH LARGEST MEALS. 180 tablet 2   Multiple Vitamin (MULTIVITAMIN) capsule Take 1 capsule by mouth daily.     Omega-3 Fatty Acids (FISH OIL) 1000 MG CAPS Take by mouth daily.     phentermine (ADIPEX-P) 37.5 MG tablet Take 1 tablet (37.5 mg total) by mouth daily before breakfast. 30 tablet 2   No current facility-administered medications for this visit.     Allergies as of 09/27/2018 - Review Complete 09/27/2018  Allergen Reaction Noted   Clinoril [sulindac]  02/20/2013   Erythromycin  02/20/2013   Shrimp [shellfish allergy]  10/05/2016    Vitals: BP 123/79 (BP Location: Right Arm, Patient Position: Sitting)    Pulse 80    Temp (!) 97.3 F (36.3 C) Comment: check-in staff took   Ht 5' 3"  (1.6 m)    Wt 186 lb (84.4 kg)    BMI 32.95 kg/m  Last Weight:  Wt Readings from Last 1 Encounters:  09/27/18 186 lb (84.4 kg)   Last Height:   Ht Readings from Last 1 Encounters:  09/27/18 5' 3"  (1.6 m)     Physical exam: Exam: Gen: NAD, conversant, well nourised, obese, well groomed                     CV: RRR, no MRG. No Carotid Bruits. No peripheral edema, warm, nontender Eyes: Conjunctivae clear without exudates or hemorrhage Lungs: clear MSK: no pain to palpation on the sternum  Neuro: Detailed  Neurologic Exam  Speech:    Speech is normal; fluent and spontaneous with normal comprehension.  Cognition:    The patient is oriented to person,  place, and time;     recent and remote memory intact;     language fluent;     normal attention, concentration,     fund of knowledge Cranial Nerves:    The pupils are equal, round, and reactive to light. The fundi are normal and spontaneous venous pulsations are present. Visual fields are full to finger confrontation. Extraocular movements are intact. Trigeminal sensation is intact and the muscles of mastication are normal. The face is symmetric. The palate elevates in the midline. Hearing intact. Voice is normal. Shoulder shrug is normal. The tongue has normal motion without fasciculations.   Coordination:    Normal finger to nose and heel to shin. Normal rapid alternating movements.   Gait:    Heel-toe and tandem gait are normal.   Motor Observation:    No asymmetry, no atrophy, and no involuntary movements noted. Tone:    Normal muscle tone.    Posture:    Posture is normal. normal erect    Strength:    Strength is V/V in the upper and lower limbs.      Sensation: intact to LT     Reflex Exam:  DTR's:    AJs absent. Deep tendon reflexes in the patellars are 1+. In the uppers 1+-2+ and symmetric.  Toes:    The toes are downgoing bilaterally.   Clonus:    Clonus is absent.    Assessment/Plan:  63 y.o. female here as requested by Unk Pinto, MD for mononeuropathy. 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  For a few years she is feeling a stabbing pain in the left shoulder blade, (points to high thoracic/low cervical area on the left around the scapula rhomboid/levator muscles). The pain starts in the chest at the sternum, and radiates down the left arm and also pain in back as described above. XR showed osteoarthritic change at several  levels, most notably at C4-5 and C5-6.   I suspect cervical radiculopathy and recommend the following:   - Highly recommend MRI cervical spine trying to also get the top levels of the thoracic to T3/T4 if possible. - she declines at this time - EMG/NCS - she declines - Recommend speaking to [place] about further imaging of the chest possibly CT Chest as clinically warranted as the pain starts in the chest just medial to the right breast.  She declines the workup would like to try injections into the cervical spine first to see if it helps. Will send to for eval of ESI or facet blocks. We don;t do these here int he office. Her insurance changes over in September with a new deductible if we can;t get her into ortho or [facility] pain management for ESI  I will order injections at [facility] and see how long that will take.  Unfortunately time is important, her deductible resets at the end of September so want to see if ESI are effective prior to that in case we need to order MRi c-spine. We are clling around currently and will see what we can do.  If treatment does not help highly recommend MRIs.   Physical therapy with traction - recommend she  declines she has a device and has seen a chiropractor so she will try on her own  Orders Placed This Encounter  Procedures   Ambulatory referral to Orthopedic Surgery    Cc: Unk Pinto, MD,  Vicie Mutters, PA-C  Sarina Ill, MD  [facility] [address] [place], [place] [address]  Phone [phone] Fax [phone]  A total of 60 minutes was spent face-to-face with this patient. Over half this time was spent on counseling patient on the  1. Cervical radiculopathy    diagnosis and different diagnostic and therapeutic options, counseling and coordination of care, risks ans benefits of management, compliance, or risk factor reduction and education.

## 2018-09-29 ENCOUNTER — Other Ambulatory Visit: Payer: Self-pay

## 2018-09-29 MED ORDER — GABAPENTIN 300 MG PO CAPS: ORAL_CAPSULE | ORAL | 2 refills | Status: DC

## 2018-10-03 ENCOUNTER — Other Ambulatory Visit: Payer: Self-pay | Admitting: Chiropractic Medicine

## 2018-10-03 DIAGNOSIS — E041 Nontoxic single thyroid nodule: Secondary | ICD-10-CM

## 2018-10-07 MED ORDER — ALPRAZOLAM 0.5 MG PO TABS: ORAL_TABLET | ORAL | 0 refills | Status: DC

## 2018-10-13 ENCOUNTER — Ambulatory Visit
Admission: RE | Admit: 2018-10-13 | Discharge: 2018-10-13 | Disposition: A | Discharge status: Home / Self Care | Payer: 59 | Source: Ambulatory Visit | Attending: Chiropractic Medicine | Admitting: Chiropractic Medicine

## 2018-10-13 DIAGNOSIS — E041 Nontoxic single thyroid nodule: Secondary | ICD-10-CM

## 2018-10-14 ENCOUNTER — Other Ambulatory Visit: Payer: Self-pay | Admitting: Physician Assistant

## 2018-10-14 DIAGNOSIS — M1A9XX0 Chronic gout, unspecified, without tophus (tophi): Secondary | ICD-10-CM

## 2018-10-20 DIAGNOSIS — E042 Nontoxic multinodular goiter: Secondary | ICD-10-CM | POA: Insufficient documentation

## 2018-10-20 NOTE — Progress Notes (Signed)
FOLLOW UP  Assessment and Plan:   Hypertension Well controlled with current medications  Monitor blood pressure at home; patient to call if consistently greater than 130/80 Continue DASH diet.   Reminder to go to the ER if any CP, SOB, nausea, dizziness, severe HA, changes vision/speech, left arm numbness and tingling and jaw pain.  Cholesterol Currently at LDL goal Continue low cholesterol diet and exercise.  Check lipid panel.   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  Perform daily foot/skin check, notify office of any concerning changes.  Check A1C  Obesity with co morbidities Long discussion about weight loss, diet, and exercise Recommended diet heavy in fruits and veggies and low in animal meats, cheeses, and dairy products, appropriate calorie intake Discussed ideal weight for height  Cut out soda; switch to stevia sweetened Will follow up in 3 months  Vitamin D Def Continue supplementation Defer Vit D level  Gout Continue allopurinol Diet discussed Check uric acid as needed  Elevated LFTs Suspect fatty liver  Continue diet and meds as discussed. Further disposition pending results of labs. Discussed med's effects and SE's.   Over 30 minutes of exam, counseling, chart review, and critical decision making was performed.  DECLINES 3 MONTH FOLLOW UP DUE TO NOT BEING ABLE TO HAVE INSURANCE  Future Appointments  Date Time Provider [place]  04/24/2019  3:00 PM Vicie Mutters, PA-C [id number] None    ----------------------------------------------------------------------------------------------------------------------  HPI 63 y.o. female  presents for 3 month follow up on hypertension, cholesterol, diabetes, obesity, gout and vitamin D deficiency.   She has had chest pain intermittent x years, had negative cardiovascular work  up. Is seeing orthopedics at this time but would like a second opinion and has appointment with Dr. Bartholome Bill, had thyroid nodules on MRI, had follow up US on thyroid and only 1 nodule meet criteria for 1 year follow up, none met criteria for biospy. Will get in 1 year.  Going to lose insurance in 3 months and going to [facility] [employer] plan  BMI is Body mass index is 32.88 kg/m., she is working on diet and exercise. She has not been on the phentermine.  Wt Readings from Last 3 Encounters:  10/25/18 185 lb 9.6 oz (84.2 kg)  09/27/18 186 lb (84.4 kg)  07/25/18 184 lb 6.4 oz (83.6 kg)   Her blood pressure has been controlled at home, today their BP is BP: 122/72  She does workout. She denies chest pain, shortness of breath, dizziness.   She is not on cholesterol medication due to excellent control with fish oil supplement. Her LDL cholesterol is at goal. Trigs remain elevated. She does admit to some soda intake recently. The cholesterol last visit was:    Lab Results  Component Value Date   CHOL 149 07/25/2018   HDL 36 (L) 07/25/2018   LDLCALC 75 07/25/2018   TRIG 312 (H) 07/25/2018   CHOLHDL 4.1 07/25/2018    She has been working on diet and exercise for T2 diabetes  on januvia, synjardy + metformin - only taking 1000 mg, amaryl 8 mg in the AM She does have CKD on ARB She has chest neuropathy on gabapentin but tapering off and getting second opinion No sugar in coffee, switching to just water but has had some sodas over last few months.  and denies foot ulcerations, increased appetite, nausea, paresthesia of the feet, visual disturbances, vomiting and weight  loss.   Last A1C in the office was:  Lab Results  Component Value Date   HGBA1C 7.7 (H) 07/25/2018   Lab Results  Component Value Date   GFRNONAA 90 07/25/2018   Patient is on Vitamin D supplement.   Lab Results  Component Value Date   VD25OH 60 04/18/2018    Patient is on allopurinol for gout and does not report a  recent flare.  Lab Results  Component Value Date   LABURIC 6.0 04/05/2017     Current Medications:  Current Outpatient Medications on File Prior to Visit  Medication Sig  . ACCU-CHEK AVIVA PLUS test strip TEST SUGAR ONCE DAILY DX 11.9  . Accu-Chek Softclix Lancets lancets Test Blood Sugar Daily (Dx: E11.9)  . allopurinol (ZYLOPRIM) 100 MG tablet Take 1 tablet Daily to Prevent Gout (Dx: M10.9)  . aspirin 81 MG tablet Take 81 mg by mouth daily.  . Blood Glucose Monitoring Suppl (ACCU-CHEK AVIVA PLUS) w/Device KIT Test sugar once daily 11.9  . Cholecalciferol (VITAMIN D) 2000 UNITS tablet Take 2,000 Units by mouth 2 (two) times daily.  . Empagliflozin-metFORMIN HCl ER (SYNJARDY XR) 25-1000 MG TB24 Take 1 tablet by mouth daily. Take 1 tablet Daily for Diabetes  . gabapentin (NEURONTIN) 300 MG capsule 1 capsule in the morning, 2 capsules at lunch, and 3 capsules at bedtime.  Marland Kitchen glimepiride (AMARYL) 4 MG tablet TAKE 1 TABLET BY MOUTH DAILY WITH BREAKFAST (Patient taking differently: 8 mg. )  . JANUVIA 100 MG tablet TAKE 1 TABLET BY MOUTH EVERY DAY  . losartan (COZAAR) 50 MG tablet TAKE 1 TABLET BY MOUTH EVERY DAY  . metFORMIN (GLUCOPHAGE-XR) 500 MG 24 hr tablet 1 TABLET BY MOUTH TWICE A DAY WITH LARGEST MEALS.  . Multiple Vitamin (MULTIVITAMIN) capsule Take 1 capsule by mouth daily.  . Omega-3 Fatty Acids (FISH OIL) 1000 MG CAPS Take by mouth daily.  . phentermine (ADIPEX-P) 37.5 MG tablet Take 1 tablet (37.5 mg total) by mouth daily before breakfast.   No current facility-administered medications on file prior to visit.      Allergies:  Allergies  Allergen Reactions  . Clinoril [Sulindac]   . Erythromycin   . Shrimp [Shellfish Allergy]      Medical History:  Past Medical History:  Diagnosis Date  . Anemia   . Anxiety   . Colon polyps   . Diabetes mellitus without complication ([place])   . Gout   . Hypertriglyceridemia   . Obstructive sleep apnea on CPAP    uses CPAP nightly  .  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  HENT: Negative for hearing loss and tinnitus.   Eyes: Negative for blurred vision and double vision.  Respiratory: Negative for cough, shortness of breath and wheezing.   Cardiovascular: Negative for chest pain, palpitations, orthopnea, claudication and leg swelling.  Gastrointestinal: Negative for abdominal pain, blood in stool, constipation, diarrhea, heartburn, melena, nausea and vomiting.  Genitourinary: Negative.   Musculoskeletal: Negative for joint pain and myalgias.  Skin: Negative for rash.  Neurological: Negative for dizziness, tingling, sensory change, weakness and headaches.  Endo/Heme/Allergies: Negative for polydipsia.  Psychiatric/Behavioral: Negative.   All other systems reviewed and are negative.    Physical Exam: BP 122/72   Pulse 98   Temp (!) 97.5 F (36.4 C)   Ht 5' 3"  (  1.6 m)   Wt 185 lb 9.6 oz (84.2 kg)   SpO2 98%   BMI 32.88 kg/m  Wt Readings from Last 3 Encounters:  10/25/18 185 lb 9.6 oz (84.2 kg)  09/27/18 186 lb (84.4 kg)  07/25/18 184 lb 6.4 oz (83.6 kg)   General Appearance: Well nourished, in no apparent distress. Eyes: PERRLA, EOMs, conjunctiva no swelling or erythema Sinuses: No Frontal/maxillary tenderness ENT/Mouth: Ext aud canals clear, TMs without erythema, bulging. No erythema, swelling, or exudate on post pharynx.  Tonsils not swollen or erythematous. Hearing normal.  Neck: Supple, thyroid normal.  Respiratory: Respiratory effort normal, BS equal bilaterally without rales, rhonchi, wheezing or stridor.  Cardio: RRR with no MRGs. Brisk peripheral pulses without edema.  Abdomen: Soft, + BS.  Non tender, no guarding, rebound, hernias, masses. Lymphatics: Non tender without lymphadenopathy.   Musculoskeletal: Full ROM, 5/5 strength, Normal gait Skin: Warm, dry without rashes, lesions, ecchymosis.  Neuro: Cranial nerves intact. No cerebellar symptoms.  Psych: Awake and oriented X 3, normal affect, Insight and Judgment appropriate.    Vicie Mutters, PA-C 9:53 AM [facility]

## 2018-10-25 ENCOUNTER — Other Ambulatory Visit: Payer: Self-pay

## 2018-10-25 ENCOUNTER — Encounter: Payer: Self-pay | Admitting: Physician Assistant

## 2018-10-25 ENCOUNTER — Ambulatory Visit (INDEPENDENT_AMBULATORY_CARE_PROVIDER_SITE_OTHER): Payer: 59 | Admitting: Physician Assistant

## 2018-10-25 VITALS — BP 122/72 | HR 98 | Temp 97.5°F | Ht 63.0 in | Wt 185.6 lb

## 2018-10-25 DIAGNOSIS — E1122 Type 2 diabetes mellitus with diabetic chronic kidney disease: Secondary | ICD-10-CM | POA: Diagnosis not present

## 2018-10-25 DIAGNOSIS — R945 Abnormal results of liver function studies: Secondary | ICD-10-CM

## 2018-10-25 DIAGNOSIS — E559 Vitamin D deficiency, unspecified: Secondary | ICD-10-CM

## 2018-10-25 DIAGNOSIS — E042 Nontoxic multinodular goiter: Secondary | ICD-10-CM

## 2018-10-25 DIAGNOSIS — I10 Essential (primary) hypertension: Secondary | ICD-10-CM | POA: Diagnosis not present

## 2018-10-25 DIAGNOSIS — E1169 Type 2 diabetes mellitus with other specified complication: Secondary | ICD-10-CM

## 2018-10-25 DIAGNOSIS — E1141 Type 2 diabetes mellitus with diabetic mononeuropathy: Secondary | ICD-10-CM

## 2018-10-25 DIAGNOSIS — E669 Obesity, unspecified: Secondary | ICD-10-CM

## 2018-10-25 DIAGNOSIS — Z79899 Other long term (current) drug therapy: Secondary | ICD-10-CM

## 2018-10-25 DIAGNOSIS — M1A9XX0 Chronic gout, unspecified, without tophus (tophi): Secondary | ICD-10-CM

## 2018-10-25 DIAGNOSIS — N182 Chronic kidney disease, stage 2 (mild): Secondary | ICD-10-CM

## 2018-10-25 DIAGNOSIS — R7989 Other specified abnormal findings of blood chemistry: Secondary | ICD-10-CM

## 2018-10-25 NOTE — Patient Instructions (Signed)
Know what a healthy weight is for you (roughly BMI <25) and aim to maintain this  Aim for 7+ servings of fruits and vegetables daily  65-80+ fluid ounces of water or unsweet tea for healthy kidneys  Limit to max 1 drink of alcohol per day; avoid smoking/tobacco  Limit animal fats in diet for cholesterol and heart health - choose grass fed whenever available  Avoid highly processed foods, and foods high in saturated/trans fats  Aim for low stress - take time to unwind and care for your mental health  Aim for 150 min of moderate intensity exercise weekly for heart health, and weights twice weekly for bone health  Aim for 7-9 hours of sleep daily  I want to challenge you to lose 10 lbs before the next appointment.  That would be about 1.5 lbs a month!   This is very achievable and I know you can do it.  10 lbs is actually 40 lbs of pressure of your joints, back, hips, knees and ankles.  10lbs may be enough to help your blood pressure and cholesterol for next visit.      When it comes to diets, agreement about the perfect plan isn't easy to find, even among the experts. Experts at the [place] [employer] developed an idea known as the Healthy Eating Plate. Just imagine a plate divided into logical, healthy portions.  The emphasis is on diet quality:  Load up on vegetables and fruits - one-half of your plate: Aim for color and variety, and remember that potatoes don't count.  Go for whole grains - one-quarter of your plate: Whole wheat, barley, wheat berries, quinoa, oats, brown rice, and foods made with them. If you want pasta, go with whole wheat pasta.  Protein power - one-quarter of your plate: Fish, chicken, beans, and nuts are all healthy, versatile protein sources. Limit red meat.  The diet, however, does go beyond the plate, offering a few other suggestions.  Use healthy plant oils, such as olive, canola, soy, corn, sunflower and peanut. Check the labels, and  avoid partially hydrogenated oil, which have unhealthy trans fats.  If you're thirsty, drink water. Coffee and tea are good in moderation, but skip sugary drinks and limit milk and dairy products to one or two daily servings.  The type of carbohydrate in the diet is more important than the amount. Some sources of carbohydrates, such as vegetables, fruits, whole grains, and beans-are healthier than others.  Finally, stay active.

## 2018-10-26 LAB — COMPLETE METABOLIC PANEL WITH GFR
AG Ratio: 2.2 (calc) (ref 1.0–2.5)
ALT: 41 U/L — ABNORMAL HIGH (ref 6–29)
AST: 29 U/L (ref 10–35)
Albumin: 4.2 g/dL (ref 3.6–5.1)
Alkaline phosphatase (APISO): 75 U/L (ref 37–153)
BUN: 15 mg/dL (ref 7–25)
CO2: 23 mmol/L (ref 20–32)
Calcium: 9.4 mg/dL (ref 8.6–10.4)
Chloride: 105 mmol/L (ref 98–110)
Creat: 0.68 mg/dL (ref 0.50–0.99)
GFR, Est African American: 108 mL/min/{1.73_m2} (ref >=60)
GFR, Est Non African American: 93 mL/min/{1.73_m2} (ref >=60)
Globulin: 1.9 g/dL (calc) (ref 1.9–3.7)
Glucose, Bld: 365 mg/dL — ABNORMAL HIGH (ref 65–99)
Potassium: 4.4 mmol/L (ref 3.5–5.3)
Sodium: 141 mmol/L (ref 135–146)
Total Bilirubin: 0.4 mg/dL (ref 0.2–1.2)
Total Protein: 6.1 g/dL (ref 6.1–8.1)

## 2018-10-26 LAB — CBC WITH DIFFERENTIAL/PLATELET
Absolute Monocytes: 279 cells/uL (ref 200–950)
Basophils Absolute: 59 cells/uL (ref 0–200)
Basophils Relative: 1.2 %
Eosinophils Absolute: 230 cells/uL (ref 15–500)
Eosinophils Relative: 4.7 %
HCT: 42.5 % (ref 35.0–45.0)
Hemoglobin: 14 g/dL (ref 11.7–15.5)
Lymphs Abs: 1411 cells/uL (ref 850–3900)
MCH: 31.8 pg (ref 27.0–33.0)
MCHC: 32.9 g/dL (ref 32.0–36.0)
MCV: 96.6 fL (ref 80.0–100.0)
MPV: 11 fL (ref 7.5–12.5)
Monocytes Relative: 5.7 %
Neutro Abs: 2920 cells/uL (ref 1500–7800)
Neutrophils Relative %: 59.6 %
Platelets: 167 10*3/uL (ref 140–400)
RBC: 4.4 10*6/uL (ref 3.80–5.10)
RDW: 12.6 % (ref 11.0–15.0)
Total Lymphocyte: 28.8 %
WBC: 4.9 10*3/uL (ref 3.8–10.8)

## 2018-10-26 LAB — LIPID PANEL
Cholesterol: 157 mg/dL (ref <200)
HDL: 32 mg/dL — ABNORMAL LOW (ref >=50)
LDL Cholesterol (Calc): 77 mg/dL (calc)
Non-HDL Cholesterol (Calc): 125 mg/dL (calc) (ref <130)
Total CHOL/HDL Ratio: 4.9 (calc) (ref <5.0)
Triglycerides: 396 mg/dL — ABNORMAL HIGH (ref <150)

## 2018-10-26 LAB — HEMOGLOBIN A1C
Hgb A1c MFr Bld: 7.9 % of total Hgb — ABNORMAL HIGH (ref <5.7)
Mean Plasma Glucose: 180 (calc)
eAG (mmol/L): 10 (calc)

## 2018-10-26 LAB — VITAMIN D 25 HYDROXY (VIT D DEFICIENCY, FRACTURES): Vit D, 25-Hydroxy: 59 ng/mL (ref 30–100)

## 2018-10-26 LAB — TSH: TSH: 0.66 mIU/L (ref 0.40–4.50)

## 2018-10-26 LAB — MAGNESIUM: Magnesium: 1.9 mg/dL (ref 1.5–2.5)

## 2018-11-03 ENCOUNTER — Other Ambulatory Visit: Payer: Self-pay | Admitting: Physician Assistant

## 2018-11-03 DIAGNOSIS — E119 Type 2 diabetes mellitus without complications: Secondary | ICD-10-CM

## 2019-01-13 ENCOUNTER — Other Ambulatory Visit: Payer: Self-pay | Admitting: Physician Assistant

## 2019-01-27 ENCOUNTER — Other Ambulatory Visit: Payer: Self-pay | Admitting: Internal Medicine

## 2019-01-27 DIAGNOSIS — E1141 Type 2 diabetes mellitus with diabetic mononeuropathy: Secondary | ICD-10-CM

## 2019-02-12 DIAGNOSIS — N390 Urinary tract infection, site not specified: Secondary | ICD-10-CM

## 2019-02-13 MED ORDER — SULFAMETHOXAZOLE-TRIMETHOPRIM 800-160 MG PO TABS: 1.0000 | ORAL_TABLET | Freq: Two times a day (BID) | ORAL | 0 refills | Status: DC

## 2019-02-13 NOTE — Telephone Encounter (Signed)
Virtual Visit via Telephone Note  I connected with Amanda Cooper on 02/13/2019 at 09:30 by telephone and verified that I am speaking with the correct person using two identifiers.  Location: Patient: Work Provider: [facility] office    I discussed the limitations, risks, security and privacy concerns of performing an evaluation and management service by telephone and the availability of in person appointments. I also discussed with the patient that there may be a patient responsible charge related to this service. The patient expressed understanding and agreed to proceed.   History of Present Illness:  64 y.o. female reports 3-4 days of urinary symptoms. She is currently in between insurance and very concerned with cost.   She reports dysuria, suprapubic/pelvic pressure, urgency, frequency, persistent over 3-4 days. She denies urinary character changes other than orange color since taking azo over the weekend (no improvement in sx). Has been pushing fluid intake without improvement. Denies hematuria.   She denies flank pain/back pain. She denies fever/chills. She denies n/v/d/c. She denies vaginal discharge, not recently sexually active. No hx of STIs.   Last culture in 12/2017 showed e. Coli UTI resistant to penicillins.    She has been working on diet and exercise for T2 diabetes (on metformin, glimepiride, januvia), and denies hyperglycemia, hypoglycemia , nausea, polydipsia, polyuria, visual disturbances, vomiting and weight loss. Last A1C in the office was:  Lab Results  Component Value Date   HGBA1C 7.9 (H) 10/25/2018    Allergies:  Allergies  Allergen Reactions  . Clinoril [Sulindac]   . Erythromycin   . Shrimp [Shellfish Allergy]    Medical History:  has Hyperlipidemia associated with type 2 diabetes mellitus ([place]); CKD stage 2 due to type 2 diabetes mellitus ([place]); Anxiety; Colon polyps; Essential hypertension; Gout; Obstructive sleep apnea on CPAP; Vitamin D deficiency;  Secondary adhesive capsulitis of right shoulder; Stress incontinence in female; Obesity (BMI 30.0-34.9); Diabetes mellitus with diabetic mononeuropathy ([place]); Elevated LFTs; Fatty infiltration of liver; and Multinodular goiter on their problem list.   Current Outpatient Medications on File Prior to Visit  Medication Sig Dispense Refill  . ACCU-CHEK AVIVA PLUS test strip TEST SUGAR ONCE DAILY DX 11.9 100 each 3  . Accu-Chek Softclix Lancets lancets Test Blood Sugar Daily (Dx: E11.9) 100 each 3  . allopurinol (ZYLOPRIM) 100 MG tablet Take 1 tablet Daily to Prevent Gout (Dx: M10.9) 90 tablet 3  . aspirin 81 MG tablet Take 81 mg by mouth daily.    . Blood Glucose Monitoring Suppl (ACCU-CHEK AVIVA PLUS) w/Device KIT Test sugar once daily 11.9 1 kit 0  . Cholecalciferol (VITAMIN D) 2000 UNITS tablet Take 2,000 Units by mouth 2 (two) times daily.    Marland Kitchen gabapentin (NEURONTIN) 300 MG capsule 1 capsule in the morning, 2 capsules at lunch, and 3 capsules at bedtime. 540 capsule 2  . glimepiride (AMARYL) 4 MG tablet Take 2 tablets Daily with Meals for Diabetes 180 tablet 1  . JANUVIA 100 MG tablet TAKE 1 TABLET BY MOUTH EVERY DAY 90 tablet 2  . losartan (COZAAR) 50 MG tablet TAKE 1 TABLET BY MOUTH EVERY DAY 90 tablet 1  . metFORMIN (GLUCOPHAGE-XR) 500 MG 24 hr tablet Take 1 tablet  2 x /day with Meals for Diabetes 180 tablet 1  . Multiple Vitamin (MULTIVITAMIN) capsule Take 1 capsule by mouth daily.    . Omega-3 Fatty Acids (FISH OIL) 1000 MG CAPS Take by mouth daily.    . phentermine (ADIPEX-P) 37.5 MG tablet Take 1 tablet (37.5  mg total) by mouth daily before breakfast. 30 tablet 2  . SYNJARDY XR 25-1000 MG TB24 TAKE 1 TABLET BY MOUTH DAILY. TAKE 1 TABLET DAILY FOR DIABETES 90 tablet 1   No current facility-administered medications on file prior to visit.      Surgical History:  She  has a past surgical history that includes Abdominal hysterectomy; Breast surgery (Left); Ovary surgery (Left, 1999);  achilles (Right, 2009); Eye surgery; Shoulder arthroscopy (Right, 2017); and Foot surgery (Left, 06/16/2017). Family History:  Herfamily history includes Alcohol abuse in her father; COPD in her mother; Depression in her mother; Diabetes in her sister; Heart attack (age of onset: 13) in her father; Heart disease in her father. Social History:   reports that she has never smoked. She has never used smokeless tobacco. She reports that she does not drink alcohol or use drugs.    Observations/Objective:  General : Well sounding patient in no apparent distress HEENT: no hoarseness, no cough for duration of visit Lungs: speaks in complete sentences, no audible wheezing, no apparent distress Neurological: alert, oriented x 3 Psychiatric: pleasant, judgement appropriate     Assessment and Plan:  Diagnoses and all orders for this visit:  Urinary tract infection without hematuria, site unspecified Presumptive tx Medications: TMP/SMX. Maintain adequate hydration. Follow up if symptoms not improving in 24, and as needed for new or recurrent sx.  -     sulfamethoxazole-trimethoprim (BACTRIM DS) 800-160 MG tablet; Take 1 tablet by mouth 2 (two) times daily.   Follow Up Instructions:    I discussed the assessment and treatment plan with the patient. The patient was provided an opportunity to ask questions and all were answered. The patient agreed with the plan and demonstrated an understanding of the instructions.   The patient was advised to call back or seek an in-person evaluation if the symptoms worsen or if the condition fails to improve as anticipated.  I provided 15 minutes of non-face-to-face time during this encounter.   Izora Ribas, NP

## 2019-02-14 DIAGNOSIS — M7582 Other shoulder lesions, left shoulder: Secondary | ICD-10-CM | POA: Diagnosis not present

## 2019-02-14 DIAGNOSIS — M5412 Radiculopathy, cervical region: Secondary | ICD-10-CM | POA: Diagnosis not present

## 2019-02-22 DIAGNOSIS — M7542 Impingement syndrome of left shoulder: Secondary | ICD-10-CM | POA: Diagnosis not present

## 2019-02-22 DIAGNOSIS — S46012D Strain of muscle(s) and tendon(s) of the rotator cuff of left shoulder, subsequent encounter: Secondary | ICD-10-CM | POA: Diagnosis not present

## 2019-02-22 DIAGNOSIS — M6281 Muscle weakness (generalized): Secondary | ICD-10-CM | POA: Diagnosis not present

## 2019-02-22 DIAGNOSIS — M25612 Stiffness of left shoulder, not elsewhere classified: Secondary | ICD-10-CM | POA: Diagnosis not present

## 2019-03-01 DIAGNOSIS — S46012D Strain of muscle(s) and tendon(s) of the rotator cuff of left shoulder, subsequent encounter: Secondary | ICD-10-CM | POA: Diagnosis not present

## 2019-03-01 DIAGNOSIS — M7542 Impingement syndrome of left shoulder: Secondary | ICD-10-CM | POA: Diagnosis not present

## 2019-03-01 DIAGNOSIS — M25612 Stiffness of left shoulder, not elsewhere classified: Secondary | ICD-10-CM | POA: Diagnosis not present

## 2019-03-01 DIAGNOSIS — M6281 Muscle weakness (generalized): Secondary | ICD-10-CM | POA: Diagnosis not present

## 2019-03-02 DIAGNOSIS — M5412 Radiculopathy, cervical region: Secondary | ICD-10-CM | POA: Diagnosis not present

## 2019-03-02 DIAGNOSIS — M542 Cervicalgia: Secondary | ICD-10-CM | POA: Diagnosis not present

## 2019-04-17 DIAGNOSIS — E1141 Type 2 diabetes mellitus with diabetic mononeuropathy: Secondary | ICD-10-CM

## 2019-04-19 MED ORDER — BYDUREON BCISE 2 MG/0.85ML ~~LOC~~ AUIJ: 2.0000 mg | AUTO-INJECTOR | SUBCUTANEOUS | 5 refills | Status: DC

## 2019-04-21 NOTE — Progress Notes (Signed)
Complete Physical  Assessment and Plan: Encounter for general adult medical examination with abnormal findings  Essential hypertension - continue medications, DASH diet, exercise and monitor at home. Call if greater than 130/80.  -     CBC with Differential/Platelet -     COMPLETE METABOLIC PANEL WITH GFR -     TSH -     Urinalysis, Routine w reflex microscopic -     Microalbumin / creatinine urine ratio -     EKG 12-Lead  Hyperlipidemia associated with type 2 diabetes mellitus (HCC) -     Lipid panel check lipids decrease fatty foods increase activity.   CKD stage 2 due to type 2 diabetes mellitus (HCC) Increase fluids, avoid NSAIDS, monitor sugars, will monitor -     COMPLETE METABOLIC PANEL WITH GFR -     Hemoglobin A1c - SYNJARNEY IS NO LONGER PAID FOR- WILL TRY TO SWITCH TO BCISE FOR WEIGHT LOSS AND BETTER COVERAGE  Type 2 diabetes mellitus with diabetic mononeuropathy, without long-term current use of insulin (HCC) -     Hemoglobin A1c Discussed general issues about diabetes pathophysiology and management., Educational material distributed., Suggested low cholesterol diet., Encouraged aerobic exercise., Discussed foot care., Reminded to get yearly retinal exam.  Fatty infiltration of liver -     COMPLETE METABOLIC PANEL WITH GFR Check labs, avoid tylenol, alcohol, weight loss advised.   Obesity (BMI 30.0-34.9) - follow up 3 months for progress monitoring - increase veggies, decrease carbs - long discussion about weight loss, diet, and exercise - will refill -     phentermine (ADIPEX-P) 37.5 MG tablet; Take 1 tablet (37.5 mg total) by mouth daily before breakfast.  Obstructive sleep apnea on CPAP Continue CPAP  Polyp of colon, unspecified part of colon, unspecified type Declines until she is on medicare due to high detuctible  Chronic gout without tophus, unspecified cause, unspecified site Gout- recheck Uric acid as needed, Diet discussed, continue  medications.  Vitamin D deficiency -     VITAMIN D 25 Hydroxy (Vit-D Deficiency, Fractures)  Stress incontinence in female Improved with PT  Anxiety -continue medications, stress management techniques discussed, increase water, good sleep hygiene discussed, increase exercise, and increase veggies.   Medication management -     Magnesium  Type 2 diabetes mellitus with diabetic mononeuropathy, without long-term current use of insulin (HCC) -     Hemoglobin A1c - CONTINUE MEDICATIONS AND FOLLOW UP WITH ORTHO  Encounter for screening for COVID-19 -     SAR CoV2 Serology (COVID 19)AB(IGG)IA - POSSIBLE INFECTION IN OCT, WILL CHECK FOR ANTIBODY     Discussed med's effects and SE's. Screening labs and tests as requested with regular follow-up as recommended. Over 40 minutes of exam, counseling, chart review, and complex, high level critical decision making was performed this visit.  Future Appointments  Date Time Provider [place]  07/27/2019  8:45 AM Vicie Mutters, PA-C [facility] None  04/23/2020  3:00 PM Vicie Mutters, PA-C [facility] None    HPI  64 y.o. female  presents for a complete physical.  She has a 7500 deductible until medicare.   Had cold in oct last x 2 weeks, never tested, sister had + covid and hospitalized.   BMI is Body mass index is 32.38 kg/m., she is working on diet and exercise. She is off sodas, since vacation.  Wt Readings from Last 3 Encounters:  04/24/19 182 lb 12.8 oz (82.9 kg)  10/25/18 185 lb 9.6 oz (84.2 kg)  09/27/18 186  lb (84.4 kg)   Her blood pressure has been controlled at home, today their BP is BP: 134/72 She does not workout. She denies chest pain, shortness of breath, dizziness.  She is not on cholesterol medication and denies myalgias. Her cholesterol close to goal of 70, her dad has heart disease and died age 43.  The cholesterol last visit was:   Lab Results  Component Value Date   CHOL 157 10/25/2018   HDL 32 (L)  10/25/2018   LDLCALC 77 10/25/2018   TRIG 396 (H) 10/25/2018   CHOLHDL 4.9 10/25/2018   She has been working on diet and exercise for diabetes. Has had DM x 22 year She has had 4 injections for neck and shoulder since Sept and was on 5 week vacation at the beach.  she is on bASA  she is on ACE/ARB She has been doing keto for 2 weeks and states her sugars have been good for 2 weeks.   She is on januiva 168m syngardy 25/1000 was not covered with insurance Bcise was sent in, needs PA Metformin 5090m2 a day amaryl 4 mg  denies paresthesia of the feet, polydipsia, polyuria and visual disturbances.  Last A1C in the office was:  Lab Results  Component Value Date   HGBA1C 7.9 (H) 10/25/2018  Last GFR: Lab Results  Component Value Date   GFRNONAA 93 10/25/2018   Patient is on Vitamin D supplement.   Lab Results  Component Value Date   VD25OH 59 10/25/2018      Current Medications:   Current Outpatient Medications (Endocrine & Metabolic):  . Marland KitchenExenatide ER (BYDUREON BCISE) 2 MG/0.85ML AUIJ, Inject 2 mg into the skin once a week. . Marland Kitchenglimepiride (AMARYL) 4 MG tablet, Take 2 tablets Daily with Meals for Diabetes .  JANUVIA 100 MG tablet, TAKE 1 TABLET BY MOUTH EVERY DAY .  metFORMIN (GLUCOPHAGE-XR) 500 MG 24 hr tablet, Take 1 tablet  2 x /day with Meals for Diabetes .  SYNJARDY XR 25-1000 MG TB24, TAKE 1 TABLET BY MOUTH DAILY. TAKE 1 TABLET DAILY FOR DIABETES  Current Outpatient Medications (Cardiovascular):  .  losartan (COZAAR) 50 MG tablet, TAKE 1 TABLET BY MOUTH EVERY DAY   Current Outpatient Medications (Analgesics):  .  allopurinol (ZYLOPRIM) 100 MG tablet, Take 1 tablet Daily to Prevent Gout (Dx: M10.9) .  aspirin 81 MG tablet, Take 81 mg by mouth daily.   Current Outpatient Medications (Other):  . Marland KitchenACCU-CHEK AVIVA PLUS test strip, TEST SUGAR ONCE DAILY DX 11.9 .  Accu-Chek Softclix Lancets lancets, Test Blood Sugar Daily (Dx: E11.9) .  Blood Glucose Monitoring Suppl  (ACCU-CHEK AVIVA PLUS) w/Device KIT, Test sugar once daily 11.9 .  Cholecalciferol (VITAMIN D) 2000 UNITS tablet, Take 2,000 Units by mouth 2 (two) times daily. . Marland Kitchengabapentin (NEURONTIN) 300 MG capsule, 1 capsule in the morning, 2 capsules at lunch, and 3 capsules at bedtime. .  Multiple Vitamin (MULTIVITAMIN) capsule, Take 1 capsule by mouth daily. .  Omega-3 Fatty Acids (FISH OIL) 1000 MG CAPS, Take by mouth daily. .  phentermine (ADIPEX-P) 37.5 MG tablet, Take 1 tablet (37.5 mg total) by mouth daily before breakfast.  Health Maintenance:   Immunization History  Administered Date(s) Administered  . PPD Test 02/22/2013, 02/23/2014  . Td 02/09/2001  . Tdap 02/23/2011  . Zoster 02/09/2006   Tetanus: 2013 Pneumovax: declines Prevnar 13: declines Flu vaccine: declines Zostavax: 2008  Pap: 2018 MGM: 02/2018 normal at [facility] DEXA: 2010 normal  Colonoscopy: 2012 will do with medicare Stress test 2018 Echo/stress test 2018 US thyroid 2020  Last Eye Exam: Dec 2020 Dr. Comer Locket.  Patient Care Team: Unk Pinto, MD as PCP - General (Internal Medicine) Calvert Cantor, MD as Consulting Physician (Ophthalmology) Richmond Campbell, MD as Consulting Physician (Gastroenterology) Brien Few, MD as Consulting Physician (Obstetrics and Gynecology) Daryll Brod, MD as Consulting Physician (Orthopedic Surgery) Lavonna Monarch, MD as Consulting Physician (Dermatology)  Medical History:  Past Medical History:  Diagnosis Date  . Anemia   . Anxiety   . Colon polyps   . Diabetes mellitus without complication ([place])   . Gout   . Hypertriglyceridemia   . Obstructive sleep apnea on CPAP    uses CPAP nightly  . 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 Clinoril [Sulindac]   . Erythromycin   . Shrimp [Shellfish Allergy]     SURGICAL HISTORY She  has a past surgical history that includes Abdominal hysterectomy; Breast surgery  (Left); Ovary surgery (Left, 1999); achilles (Right, 2009); Eye surgery; Shoulder arthroscopy (Right, 2017); and Foot surgery (Left, 06/16/2017). FAMILY HISTORY Her family history includes Alcohol abuse in her father; COPD in her mother; Depression in her mother; Diabetes in her sister; Heart attack (age of onset: 63) in her father; Heart disease in her father. SOCIAL HISTORY She  reports that she has never smoked. She has never used smokeless tobacco. She reports that she does not drink alcohol or use drugs.  Review of Systems: Review of Systems  Constitutional: Negative.   HENT: Negative.   Eyes: Negative.   Respiratory: Negative.   Cardiovascular: Negative.   Gastrointestinal: Negative.   Genitourinary: Negative for dysuria, flank pain, frequency, hematuria and urgency.       Stress incontinence  Musculoskeletal: Negative.   Skin: Negative.   Neurological: Negative.   Endo/Heme/Allergies: Negative for environmental allergies and polydipsia. Does not bruise/bleed easily.  Psychiatric/Behavioral: Negative.     Physical Exam: Estimated body mass index is 32.38 kg/m as calculated from the following:   Height as of this encounter: 5' 3"  (1.6 m).   Weight as of this encounter: 182 lb 12.8 oz (82.9 kg). BP 134/72   Pulse 100   Temp 97.6 F (36.4 C)   Ht 5' 3"  (1.6 m)   Wt 182 lb 12.8 oz (82.9 kg)   SpO2 97%   BMI 32.38 kg/m  General Appearance: Well nourished, in no apparent distress.  Eyes: PERRLA, EOMs, conjunctiva no swelling or erythema, normal fundi and vessels.  Sinuses: No Frontal/maxillary tenderness  ENT/Mouth: Ext aud canals clear, normal light reflex with TMs without erythema, bulging. Good dentition. No erythema, swelling, or exudate on post pharynx. Tonsils not swollen or erythematous. Hearing normal.  Neck: Supple, thyroid normal. No bruits  Respiratory: Respiratory effort normal, BS equal bilaterally without rales, rhonchi, wheezing or stridor.  Cardio: RRR  without murmurs, rubs or gallops. Brisk peripheral pulses without edema.  Chest: symmetric, with normal excursions and percussion.  Breasts: Symmetric, without lumps, nipple discharge, retractions.  Abdomen: Soft, nontender, no guarding, rebound, hernias, masses, or organomegaly.  Lymphatics: Non tender without lymphadenopathy.  Musculoskeletal: Full ROM all peripheral extremities,5/5 strength, and normal gait.  Skin: Warm, dry without rashes, lesions, ecchymosis. Neuro: Cranial nerves intact, reflexes equal bilaterally. Normal muscle tone, no cerebellar symptoms. Sensation intact.  Psych: Awake and oriented X 3, normal affect, Insight and Judgment appropriate.   EKG: WNL no ST changes. AORTA SCAN: defer  Vicie Mutters 3:12 PM [facility]

## 2019-04-24 ENCOUNTER — Other Ambulatory Visit: Payer: Self-pay

## 2019-04-24 ENCOUNTER — Ambulatory Visit (INDEPENDENT_AMBULATORY_CARE_PROVIDER_SITE_OTHER): Payer: BC Managed Care – PPO | Admitting: Physician Assistant

## 2019-04-24 ENCOUNTER — Encounter: Payer: Self-pay | Admitting: Physician Assistant

## 2019-04-24 VITALS — BP 134/72 | HR 100 | Temp 97.6°F | Ht 63.0 in | Wt 182.8 lb

## 2019-04-24 DIAGNOSIS — K635 Polyp of colon: Secondary | ICD-10-CM

## 2019-04-24 DIAGNOSIS — Z1389 Encounter for screening for other disorder: Secondary | ICD-10-CM

## 2019-04-24 DIAGNOSIS — I10 Essential (primary) hypertension: Secondary | ICD-10-CM | POA: Diagnosis not present

## 2019-04-24 DIAGNOSIS — Z136 Encounter for screening for cardiovascular disorders: Secondary | ICD-10-CM

## 2019-04-24 DIAGNOSIS — Z1152 Encounter for screening for COVID-19: Secondary | ICD-10-CM

## 2019-04-24 DIAGNOSIS — Z0000 Encounter for general adult medical examination without abnormal findings: Secondary | ICD-10-CM

## 2019-04-24 DIAGNOSIS — E042 Nontoxic multinodular goiter: Secondary | ICD-10-CM

## 2019-04-24 DIAGNOSIS — E1169 Type 2 diabetes mellitus with other specified complication: Secondary | ICD-10-CM

## 2019-04-24 DIAGNOSIS — Z1329 Encounter for screening for other suspected endocrine disorder: Secondary | ICD-10-CM | POA: Diagnosis not present

## 2019-04-24 DIAGNOSIS — Z0001 Encounter for general adult medical examination with abnormal findings: Secondary | ICD-10-CM

## 2019-04-24 DIAGNOSIS — N182 Chronic kidney disease, stage 2 (mild): Secondary | ICD-10-CM

## 2019-04-24 DIAGNOSIS — E785 Hyperlipidemia, unspecified: Secondary | ICD-10-CM | POA: Diagnosis not present

## 2019-04-24 DIAGNOSIS — E669 Obesity, unspecified: Secondary | ICD-10-CM

## 2019-04-24 DIAGNOSIS — N393 Stress incontinence (female) (male): Secondary | ICD-10-CM

## 2019-04-24 DIAGNOSIS — Z13220 Encounter for screening for lipoid disorders: Secondary | ICD-10-CM | POA: Diagnosis not present

## 2019-04-24 DIAGNOSIS — E559 Vitamin D deficiency, unspecified: Secondary | ICD-10-CM | POA: Diagnosis not present

## 2019-04-24 DIAGNOSIS — Z79899 Other long term (current) drug therapy: Secondary | ICD-10-CM

## 2019-04-24 DIAGNOSIS — Z131 Encounter for screening for diabetes mellitus: Secondary | ICD-10-CM

## 2019-04-24 DIAGNOSIS — K760 Fatty (change of) liver, not elsewhere classified: Secondary | ICD-10-CM

## 2019-04-24 DIAGNOSIS — M1A9XX0 Chronic gout, unspecified, without tophus (tophi): Secondary | ICD-10-CM

## 2019-04-24 DIAGNOSIS — F419 Anxiety disorder, unspecified: Secondary | ICD-10-CM

## 2019-04-24 DIAGNOSIS — E1122 Type 2 diabetes mellitus with diabetic chronic kidney disease: Secondary | ICD-10-CM | POA: Diagnosis not present

## 2019-04-24 DIAGNOSIS — E1141 Type 2 diabetes mellitus with diabetic mononeuropathy: Secondary | ICD-10-CM

## 2019-04-24 DIAGNOSIS — G4733 Obstructive sleep apnea (adult) (pediatric): Secondary | ICD-10-CM

## 2019-04-24 MED ORDER — PHENTERMINE HCL 37.5 MG PO TABS: 37.5000 mg | ORAL_TABLET | Freq: Every day | ORAL | 2 refills | Status: DC

## 2019-04-24 NOTE — Patient Instructions (Addendum)
Please be careful with glimepiride (Amaryl).   This medication forces your blood sugar down no matter what it is starting at.  ONLY TAKE THIS MEDICATION WITH FOOD Do not take this medication if your sugar is below 150 Can take 1/2 of the medication if your sugar is between 150 and 180 Can take a whole pill if your sugar is above 200   If at any time you start to have low blood sugars in the morning or during the day please stop this medication.  Please never take this medication if you are sick or can not eat.  A low blood sugar is much more dangerous than a high blood sugar. Your brain needs two things, sugar and oxygen.   telogen effluvium- stress hair loss  We are starting you on Metformin to prevent or treat diabetes.  Metformin does NOT cause low blood sugars.   In order to create energy your cells need insulin and sugar but sometime your cells do not accept the insulin and this can cause increased sugars and decreased energy.   The Metformin helps your cells accept insulin and the sugar this help: 1) increase your energy  2) weight loss.    The two most common side effects are nausea and diarrhea, follow these rules to avoid it but these symptoms get better with time on the medication.    ALSO You can take imodium per box instructions when starting metformin if needed.   Rules of metformin: 1) start out slow with only one pill daily. Our goal for you is 4 pills a day or 2000mg  total.  2) take with your largest meal. 3) Take with least amount of carbs.   Call if you have any problems.   General eating tips  What to Avoid . Avoid added sugars o Often added sugar can be found in processed foods such as many condiments, dry cereals, cakes, cookies, chips, crisps, crackers, candies, sweetened drinks, etc.  o Read labels and AVOID/DECREASE use of foods with the following in their ingredient list: Sugar, fructose, high fructose corn syrup, sucrose, glucose, maltose, dextrose,  molasses, cane sugar, brown sugar, any type of syrup, agave nectar, etc.   . Avoid snacking in between meals- drink water or if you feel you need a snack, pick a high water content snack such as cucumbers, watermelon, or any veggie.  Marland Kitchen Avoid foods made with flour o If you are going to eat food made with flour, choose those made with whole-grains; and, minimize your consumption as much as is tolerable . Avoid processed foods o These foods are generally stocked in the middle of the grocery store.  o Focus on shopping on the perimeter of the grocery.  What to Include . Vegetables o GREEN LEAFY VEGETABLES: Kale, spinach, mustard greens, collard greens, cabbage, broccoli, etc. o OTHER: Asparagus, cauliflower, eggplant, carrots, peas, Brussel sprouts, tomatoes, bell peppers, zucchini, beets, cucumbers, etc. . Grains, seeds, and legumes o Beans: kidney beans, black eyed peas, garbanzo beans, black beans, pinto beans, etc. o Whole, unrefined grains: brown rice, barley, bulgur, oatmeal, etc. . Healthy fats  o Avoid highly processed fats such as vegetable oil o Examples of healthy fats: avocado, olives, virgin olive oil, dark chocolate (?72% Cocoa), nuts (peanuts, almonds, walnuts, cashews, pecans, etc.) o Please still do small amount of these healthy fats, they are dense in calories.  . Low - Moderate Intake of Animal Sources of Protein o Meat sources: chicken, [place], salmon, tuna. Limit to 4  ounces of meat at one time or the size of your palm. o Consider limiting dairy sources, but when choosing dairy focus on: PLAIN [place] yogurt, cottage cheese, high-protein milk . Fruit o Choose berries    Here is some information about the vaccines. The data is very good and this information hopefully answers a lot of your questions and give you a confidence boost.   The Pfizer and Moderna vaccines are messenger RNA vaccines. That technology is not new, it has been studied for 20 years at least, used for  cancer and MS treatment. They had started using the vaccine for MERS AND SARS (both a different coronavirus) 10 years ago but never finished so we had a good backbone for this vaccine.   There were no short cuts with the techniques for these clinical  trials, just lots of willing participants quickly and lots of up front money helped speed up the process.   The mRNA is very fragile which is why it needs to be kept so cold and thawed a certain way, think of it as a message in a glass bottle. NO PART of the virus is in this vaccine, it is a clip of the genetic sequence. This mRNA is injected in your arm, connects with a ribosome, delivers the message and the degrades.  That is part of the cause of the sore arm, the mRNA never leaves your arm. It degrades there. The mRNA does not go into our nucleotide where our DNA is, and we would need a DNA reverse transcriptase to take RNA to DNA, we do not have this, it can not change our DNA. The ribosome that got the message creates a protein and that protein circulates in our body and we have an immune reaction to that creating antibodies. Any time our immune system is triggered, inflammation is triggered too so you can have a temp, muscle aches, etc. Normal reaction.  I have seen so many patients that just had mild COVID in the office weeks later still have issues. We are still learning about post COVID syndrome, the CDC should be coming out for guidelines for practioners soon. There is too much unknown about COVID. We have been using vaccines for over 100 years or more, i Personnel officer. Ask your parents or any older friends about polio and that vaccine, that was a disease shutting down schools,had kids in iron lung, devastating young kids and families. People lined up for that vaccine and technology has only improved.   Please get the vaccine. If you have any further questions please make an appointment in the office to discuss further.  Estill Bamberg

## 2019-04-24 NOTE — Progress Notes (Signed)
Patient needs to have her pharmacy send the office the Prior Auth request so that it will be noted that her insurance will not cover. Once insurance company initiates the request I can complete it on Cover My Meds

## 2019-04-25 LAB — CBC WITH DIFFERENTIAL/PLATELET
Absolute Monocytes: 459 cells/uL (ref 200–950)
Basophils Absolute: 67 cells/uL (ref 0–200)
Basophils Relative: 0.9 %
Eosinophils Absolute: 488 cells/uL (ref 15–500)
Eosinophils Relative: 6.6 %
HCT: 44 % (ref 35.0–45.0)
Hemoglobin: 15 g/dL (ref 11.7–15.5)
Lymphs Abs: 2227 cells/uL (ref 850–3900)
MCH: 32.7 pg (ref 27.0–33.0)
MCHC: 34.1 g/dL (ref 32.0–36.0)
MCV: 95.9 fL (ref 80.0–100.0)
MPV: 10.7 fL (ref 7.5–12.5)
Monocytes Relative: 6.2 %
Neutro Abs: 4159 cells/uL (ref 1500–7800)
Neutrophils Relative %: 56.2 %
Platelets: 218 10*3/uL (ref 140–400)
RBC: 4.59 10*6/uL (ref 3.80–5.10)
RDW: 13.1 % (ref 11.0–15.0)
Total Lymphocyte: 30.1 %
WBC: 7.4 10*3/uL (ref 3.8–10.8)

## 2019-04-25 LAB — URINALYSIS, ROUTINE W REFLEX MICROSCOPIC
Bilirubin Urine: NEGATIVE
Hgb urine dipstick: NEGATIVE
Leukocytes,Ua: NEGATIVE
Nitrite: NEGATIVE
Protein, ur: NEGATIVE
Specific Gravity, Urine: 1.033 (ref 1.001–1.03)
pH: <=5 (ref 5.0–8.0)

## 2019-04-25 LAB — COMPLETE METABOLIC PANEL WITH GFR
AG Ratio: 2.4 (calc) (ref 1.0–2.5)
ALT: 44 U/L — ABNORMAL HIGH (ref 6–29)
AST: 29 U/L (ref 10–35)
Albumin: 4.7 g/dL (ref 3.6–5.1)
Alkaline phosphatase (APISO): 91 U/L (ref 37–153)
BUN: 20 mg/dL (ref 7–25)
CO2: 23 mmol/L (ref 20–32)
Calcium: 10.6 mg/dL — ABNORMAL HIGH (ref 8.6–10.4)
Chloride: 104 mmol/L (ref 98–110)
Creat: 0.75 mg/dL (ref 0.50–0.99)
GFR, Est African American: 98 mL/min/{1.73_m2} (ref >=60)
GFR, Est Non African American: 85 mL/min/{1.73_m2} (ref >=60)
Globulin: 2 g/dL (calc) (ref 1.9–3.7)
Glucose, Bld: 115 mg/dL — ABNORMAL HIGH (ref 65–99)
Potassium: 4.1 mmol/L (ref 3.5–5.3)
Sodium: 139 mmol/L (ref 135–146)
Total Bilirubin: 0.3 mg/dL (ref 0.2–1.2)
Total Protein: 6.7 g/dL (ref 6.1–8.1)

## 2019-04-25 LAB — MICROALBUMIN / CREATININE URINE RATIO
Creatinine, Urine: 36 mg/dL (ref 20–275)
Microalb, Ur: <0.2 mg/dL

## 2019-04-25 LAB — MAGNESIUM: Magnesium: 1.9 mg/dL (ref 1.5–2.5)

## 2019-04-25 LAB — LIPID PANEL
Cholesterol: 159 mg/dL (ref <200)
HDL: 32 mg/dL — ABNORMAL LOW (ref >=50)
Non-HDL Cholesterol (Calc): 127 mg/dL (calc) (ref <130)
Total CHOL/HDL Ratio: 5 (calc) — ABNORMAL HIGH (ref <5.0)
Triglycerides: 631 mg/dL — ABNORMAL HIGH (ref <150)

## 2019-04-25 LAB — HEMOGLOBIN A1C
Hgb A1c MFr Bld: 7.9 % of total Hgb — ABNORMAL HIGH (ref <5.7)
Mean Plasma Glucose: 180 (calc)
eAG (mmol/L): 10 (calc)

## 2019-04-25 LAB — TSH: TSH: 0.64 mIU/L (ref 0.40–4.50)

## 2019-04-25 LAB — VITAMIN D 25 HYDROXY (VIT D DEFICIENCY, FRACTURES): Vit D, 25-Hydroxy: 57 ng/mL (ref 30–100)

## 2019-04-25 LAB — SAR COV2 SEROLOGY (COVID19)AB(IGG),IA: SARS CoV2 AB IGG: POSITIVE — AB

## 2019-04-26 LAB — HM DIABETES EYE EXAM

## 2019-04-27 MED ORDER — SYNJARDY XR 25-1000 MG PO TB24: 1.0000 | ORAL_TABLET | Freq: Every day | ORAL | 1 refills | Status: DC

## 2019-04-27 NOTE — Addendum Note (Signed)
Addended by: Vicie Mutters R on: 04/27/2019 10:40 AM   Modules accepted: Orders

## 2019-04-28 ENCOUNTER — Telehealth: Payer: Self-pay

## 2019-04-28 NOTE — Telephone Encounter (Signed)
Patient was informed of meds.

## 2019-04-28 NOTE — Telephone Encounter (Signed)
-----   Message from Vicie Mutters, [place] sent at 04/27/2019  4:22 PM EDT ----- Regarding: RE: MED QUESTION--PHARMACY ContactSD:[id number] Can take 2 of the metformin with the synjardy to equal 2000 mg of metformin a day.  Estill Bamberg ----- Message ----- From: Elenor Quinones, CMA Sent: 04/27/2019   4:12 PM EDT To: Vicie Mutters, PA-C Subject: MED QUESTION--PHARMACY                         PER PHARMACY             Does the patient still need to take the metformin along with the [facility] that was sent in yesterday.   [facility]

## 2019-05-02 ENCOUNTER — Encounter: Payer: Self-pay | Admitting: *Deleted

## 2019-05-30 DIAGNOSIS — Z1231 Encounter for screening mammogram for malignant neoplasm of breast: Secondary | ICD-10-CM | POA: Diagnosis not present

## 2019-05-30 LAB — HM MAMMOGRAPHY

## 2019-05-31 ENCOUNTER — Telehealth: Payer: Self-pay | Admitting: *Deleted

## 2019-05-31 ENCOUNTER — Encounter: Payer: Self-pay | Admitting: *Deleted

## 2019-05-31 DIAGNOSIS — M5412 Radiculopathy, cervical region: Secondary | ICD-10-CM | POA: Diagnosis not present

## 2019-05-31 DIAGNOSIS — M542 Cervicalgia: Secondary | ICD-10-CM | POA: Diagnosis not present

## 2019-05-31 NOTE — Telephone Encounter (Signed)
Left a message to inform the patient of normal mammogram results.

## 2019-06-16 ENCOUNTER — Other Ambulatory Visit: Payer: Self-pay

## 2019-06-16 MED ORDER — SITAGLIPTIN PHOSPHATE 100 MG PO TABS: 100.0000 mg | ORAL_TABLET | Freq: Every day | ORAL | 2 refills | Status: DC

## 2019-07-26 NOTE — Progress Notes (Signed)
FOLLOW UP  Assessment and Plan:  Mononueorapthy ? From her neck Try voltern gel, try salon pas Try topamax and celebrex Follow up ortho- may need to refer to neuro surgery  Hypertension Well controlled with current medications  Monitor blood pressure at home; patient to call if consistently greater than 130/80 Continue DASH diet.   Reminder to go to the ER if any CP, SOB, nausea, dizziness, severe HA, changes vision/speech, left arm numbness and tingling and jaw pain.  Cholesterol Currently at LDL goal Continue low cholesterol diet and exercise.  Check lipid panel.   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  Perform daily foot/skin check, notify office of any concerning changes.  Check A1C  Obesity with co morbidities Long discussion about weight loss, diet, and exercise Recommended diet heavy in fruits and veggies and low in animal meats, cheeses, and dairy products, appropriate calorie intake Discussed ideal weight for height  Cut out soda; switch to stevia sweetened Will follow up in 3 months  Vitamin D Def Continue supplementation Defer Vit D level  Gout Continue allopurinol Diet discussed Check uric acid as needed  Elevated LFTs Suspect fatty liver  Continue diet and meds as discussed. Further disposition pending results of labs. Discussed med's effects and SE's.   Over 30 minutes of exam, counseling, chart review, and critical decision making was performed.   Future Appointments  Date Time Provider [place]  04/23/2020  3:00 PM Vicie Mutters, PA-C [id number] None    ----------------------------------------------------------------------------------------------------------------------  HPI 64 y.o. female  presents for 3 month follow up on hypertension, cholesterol, diabetes, obesity, gout and  vitamin D deficiency.   She has had chest pain intermittent x years, had negative cardiovascular work up. Is seeing orthopedics at this time but would like a second opinion and has appointment with Dr. Bartholome Bill, had thyroid nodules on MRI, had follow up US on thyroid and only 1 nodule meet criteria for 1 year follow up, none met criteria for biospy. Will get in 1 year.  Going to lose insurance in 3 months and going to [employer] plan  BMI is Body mass index is 30.65 kg/m., she is working on diet and exercise. She has not been on the phentermine.  Wt Readings from Last 3 Encounters:  07/27/19 173 lb (78.5 kg)  04/24/19 182 lb 12.8 oz (82.9 kg)  10/25/18 185 lb 9.6 oz (84.2 kg)   Her blood pressure has been controlled at home, today their BP is BP: 126/72  She does workout. She denies chest pain, shortness of breath, dizziness.   She is not on cholesterol medication due to excellent control with fish oil supplement. Her LDL cholesterol is at goal. Trigs remain elevated. She does admit to some soda intake recently. The cholesterol last visit was:    Lab Results  Component Value Date   CHOL 159 04/24/2019   HDL 32 (L) 04/24/2019   [place]  04/24/2019     Comment:     . LDL cholesterol not calculated. Triglyceride levels greater than 400 mg/dL invalidate calculated LDL results. . Reference range: <100 . Desirable range <100 mg/dL for primary prevention;   <70 mg/dL for patients with CHD or diabetic patients  with > or = 2 CHD risk factors. Marland Kitchen LDL-C is now calculated using the Martin-Hopkins  calculation, which is a validated novel method providing  better accuracy than the [employer]  equation in the  estimation of LDL-C.  Cresenciano Genre et al. Annamaria Helling. 0932;355(73): 2061-2068  (http://education.[employer].com/faq/FAQ164)    TRIG 631 (H) 04/24/2019   CHOLHDL 5.0 (H) 04/24/2019    She has been working on diet and exercise for T2 diabetes  on januvia, synjardy + metformin BUT  JANUVIA IS EXPENSIVE- WILL TRY TO CHANGE TO TRIJYNTA - only taking 1000 mg, amaryl 8 mg in the AM She does have CKD on ARB She has chest neuropathy- HAD NEGATIVE CARDIO WORK UP- HAD NECK INJECTION AND PREDNISONE X 2 WEEKS.- SHE HAS HAD ACCUPRESSURE. PREDNISONE DID NOT HELP. No sugar in coffee, switching to just water but has had some sodas over last few months.  and denies foot ulcerations, increased appetite, nausea, paresthesia of the feet, visual disturbances, vomiting and weight loss.   Last A1C in the office was:  Lab Results  Component Value Date   HGBA1C 7.9 (H) 04/24/2019   Lab Results  Component Value Date   GFRNONAA 85 04/24/2019   Patient is on Vitamin D supplement.   Lab Results  Component Value Date   VD25OH 57 04/24/2019    Patient is on allopurinol for gout and does not report a recent flare.  Lab Results  Component Value Date   LABURIC 6.0 04/05/2017     Current Medications:  Current Outpatient Medications on File Prior to Visit  Medication Sig  . ACCU-CHEK AVIVA PLUS test strip TEST SUGAR ONCE DAILY DX 11.9  . Accu-Chek Softclix Lancets lancets Test Blood Sugar Daily (Dx: E11.9)  . allopurinol (ZYLOPRIM) 100 MG tablet Take 1 tablet Daily to Prevent Gout (Dx: M10.9)  . aspirin 81 MG tablet Take 81 mg by mouth daily.  . Blood Glucose Monitoring Suppl (ACCU-CHEK AVIVA PLUS) w/Device KIT Test sugar once daily 11.9  . Cholecalciferol (VITAMIN D) 2000 UNITS tablet Take 2,000 Units by mouth 2 (two) times daily.  Marland Kitchen gabapentin (NEURONTIN) 300 MG capsule 1 capsule in the morning, 2 capsules at lunch, and 3 capsules at bedtime.  Marland Kitchen glimepiride (AMARYL) 4 MG tablet Take 2 tablets Daily with Meals for Diabetes  . losartan (COZAAR) 50 MG tablet TAKE 1 TABLET BY MOUTH EVERY DAY  . metFORMIN (GLUCOPHAGE-XR) 500 MG 24 hr tablet Take 1 tablet  2 x /day with Meals for Diabetes  . Multiple Vitamin (MULTIVITAMIN) capsule Take 1 capsule by mouth daily.  . Omega-3 Fatty Acids (FISH  OIL) 1000 MG CAPS Take by mouth daily.  . phentermine (ADIPEX-P) 37.5 MG tablet Take 1 tablet (37.5 mg total) by mouth daily before breakfast. (Patient not taking: Reported on 07/27/2019)   No current facility-administered medications on file prior to visit.     Allergies:  Allergies  Allergen Reactions  . Clinoril [Sulindac]   . Erythromycin   . Shrimp [Shellfish Allergy]      Medical History:  Past Medical History:  Diagnosis Date  . Anemia   . Anxiety   . Colon polyps   . Diabetes mellitus without complication ([place])   . Gout   . Hypertriglyceridemia   . Obstructive sleep apnea on CPAP    uses CPAP nightly  . 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  HENT: Negative for hearing loss and tinnitus.   Eyes: Negative for blurred vision and double vision.  Respiratory: Negative for cough, shortness of  breath and wheezing.   Cardiovascular: Negative for chest pain, palpitations, orthopnea, claudication and leg swelling.  Gastrointestinal: Negative for abdominal pain, blood in stool, constipation, diarrhea, heartburn, melena, nausea and vomiting.  Genitourinary: Negative.   Musculoskeletal: Negative for joint pain and myalgias.  Skin: Negative for rash.  Neurological: Negative for dizziness, tingling, sensory change, weakness and headaches.  Endo/Heme/Allergies: Negative for polydipsia.  Psychiatric/Behavioral: Negative.   All other systems reviewed and are negative.    Physical Exam: BP 126/72   Pulse 72   Temp 98.2 F (36.8 C)   Wt 173 lb (78.5 kg)   SpO2 98%   BMI 30.65 kg/m  Wt Readings from Last 3 Encounters:  07/27/19 173 lb (78.5 kg)  04/24/19 182 lb 12.8 oz (82.9 kg)  10/25/18 185 lb 9.6 oz (84.2 kg)   General Appearance: Well nourished, in no apparent  distress. Eyes: PERRLA, EOMs, conjunctiva no swelling or erythema Sinuses: No Frontal/maxillary tenderness ENT/Mouth: Ext aud canals clear, TMs without erythema, bulging. No erythema, swelling, or exudate on post pharynx.  Tonsils not swollen or erythematous. Hearing normal.  Neck: Supple, thyroid normal.  Respiratory: Respiratory effort normal, BS equal bilaterally without rales, rhonchi, wheezing or stridor.  Cardio: RRR with no MRGs. Brisk peripheral pulses without edema.  Abdomen: Soft, + BS.  Non tender, no guarding, rebound, hernias, masses. Lymphatics: Non tender without lymphadenopathy.  Musculoskeletal: Full ROM, 5/5 strength, Normal gait Skin: Warm, dry without rashes, lesions, ecchymosis.  Neuro: Cranial nerves intact. No cerebellar symptoms.  Psych: Awake and oriented X 3, normal affect, Insight and Judgment appropriate.    Vicie Mutters, PA-C 9:15 AM [facility]

## 2019-07-27 ENCOUNTER — Encounter: Payer: Self-pay | Admitting: Physician Assistant

## 2019-07-27 ENCOUNTER — Other Ambulatory Visit: Payer: Self-pay

## 2019-07-27 ENCOUNTER — Ambulatory Visit (INDEPENDENT_AMBULATORY_CARE_PROVIDER_SITE_OTHER): Payer: BC Managed Care – PPO | Admitting: Physician Assistant

## 2019-07-27 VITALS — BP 126/72 | HR 72 | Temp 98.2°F | Wt 173.0 lb

## 2019-07-27 DIAGNOSIS — E785 Hyperlipidemia, unspecified: Secondary | ICD-10-CM | POA: Diagnosis not present

## 2019-07-27 DIAGNOSIS — I10 Essential (primary) hypertension: Secondary | ICD-10-CM | POA: Diagnosis not present

## 2019-07-27 DIAGNOSIS — K760 Fatty (change of) liver, not elsewhere classified: Secondary | ICD-10-CM

## 2019-07-27 DIAGNOSIS — E1169 Type 2 diabetes mellitus with other specified complication: Secondary | ICD-10-CM

## 2019-07-27 DIAGNOSIS — E1122 Type 2 diabetes mellitus with diabetic chronic kidney disease: Secondary | ICD-10-CM | POA: Diagnosis not present

## 2019-07-27 DIAGNOSIS — E1141 Type 2 diabetes mellitus with diabetic mononeuropathy: Secondary | ICD-10-CM

## 2019-07-27 DIAGNOSIS — G589 Mononeuropathy, unspecified: Secondary | ICD-10-CM

## 2019-07-27 DIAGNOSIS — N182 Chronic kidney disease, stage 2 (mild): Secondary | ICD-10-CM

## 2019-07-27 MED ORDER — TRIJARDY XR 25-5-1000 MG PO TB24: ORAL_TABLET | ORAL | 1 refills | Status: DC

## 2019-07-27 MED ORDER — CELECOXIB 200 MG PO CAPS: 200.0000 mg | ORAL_CAPSULE | Freq: Two times a day (BID) | ORAL | 3 refills | Status: DC

## 2019-07-27 MED ORDER — TOPIRAMATE 50 MG PO TABS: 50.0000 mg | ORAL_TABLET | Freq: Two times a day (BID) | ORAL | 2 refills | Status: DC

## 2019-07-27 NOTE — Patient Instructions (Addendum)
TRY THE VOLTERN GEL 3 X A DAY TRY THE SALONPAS PATCHES  INFORMATION ABOUT YOUR XRAY- CAN GET THIS ANYTIME THIS YEAR IF YOU WANT [facility] Can walk into [address] building for an Insurance account manager. They will have the order and take you back. You do not any paper work, I should get the result back today or tomorrow. This order is good for a year.  Can call [phone] to schedule an appointment if you wish.   STOP THE SYNJARDY- TRY THE TRIJYNTA  TOPAMAX Going to start you on topamax, start on 1/2 pill for 3-5 nights, can increase to a whole pill for 1-2 weeks.  Can increase to 1 pill at night and 1/2-1 pill in the AM.  This medication is good for weight loss, headaches, pain This medication can cause numbness, tingling and can cause brain fog- stop if you get these If you get blurry vision or have a history of glaucoma please stop this medication.  Let me know how you are doing with this.   Topiramate tablets What is this medicine? TOPIRAMATE (toe PYRE a mate) is used to treat seizures in adults or children with epilepsy. It is also used for the prevention of migraine headaches. This medicine may be used for other purposes; ask your health care provider or pharmacist if you have questions. COMMON BRAND NAME(S): Topamax, Topiragen What should I tell my health care provider before I take this medicine? They need to know if you have any of these conditions: -bleeding disorders -cirrhosis of the liver or liver disease -diarrhea -glaucoma -kidney stones or kidney disease -low blood counts, like low white cell, platelet, or red cell counts -lung disease like asthma, obstructive pulmonary disease, emphysema -metabolic acidosis -on a ketogenic diet -schedule for surgery or a procedure -suicidal thoughts, plans, or attempt; a previous suicide attempt by you or a family member -an unusual or allergic reaction to topiramate, other medicines, foods, dyes, or preservatives -pregnant or  trying to get pregnant -breast-feeding How should I use this medicine? Take this medicine by mouth with a glass of water. Follow the directions on the prescription label. Do not crush or chew. You may take this medicine with meals. Take your medicine at regular intervals. Do not take it more often than directed. Talk to your pediatrician regarding the use of this medicine in children. Special care may be needed. While this drug may be prescribed for children as young as 28 years of age for selected conditions, precautions do apply. Overdosage: If you think you have taken too much of this medicine contact a poison control center or emergency room at once. NOTE: This medicine is only for you. Do not share this medicine with others. What if I miss a dose? If you miss a dose, take it as soon as you can. If your next dose is to be taken in less than 6 hours, then do not take the missed dose. Take the next dose at your regular time. Do not take double or extra doses. What may interact with this medicine? Do not take this medicine with any of the following medications: -probenecid This medicine may also interact with the following medications: -acetazolamide -alcohol -amitriptyline -aspirin and aspirin-like medicines -birth control pills -certain medicines for depression -certain medicines for seizures -certain medicines that treat or prevent blood clots like warfarin, enoxaparin, dalteparin, apixaban, dabigatran, and rivaroxaban -digoxin -hydrochlorothiazide -lithium -medicines for pain, sleep, or muscle relaxation -metformin -methazolamide -NSAIDS, medicines for pain and inflammation, like ibuprofen  or naproxen -pioglitazone -risperidone This list may not describe all possible interactions. Give your health care provider a list of all the medicines, herbs, non-prescription drugs, or dietary supplements you use. Also tell them if you smoke, drink alcohol, or use illegal drugs. Some items may  interact with your medicine. What should I watch for while using this medicine? Visit your doctor or health care professional for regular checks on your progress. Do not stop taking this medicine suddenly. This increases the risk of seizures if you are using this medicine to control epilepsy. Wear a medical identification bracelet or chain to say you have epilepsy or seizures, and carry a card that lists all your medicines. This medicine can decrease sweating and increase your body temperature. Watch for signs of deceased sweating or fever, especially in children. Avoid extreme heat, hot baths, and saunas. Be careful about exercising, especially in hot weather. Contact your health care provider right away if you notice a fever or decrease in sweating. You should drink plenty of fluids while taking this medicine. If you have had kidney stones in the past, this will help to reduce your chances of forming kidney stones. If you have stomach pain, with nausea or vomiting and yellowing of your eyes or skin, call your doctor immediately. You may get drowsy, dizzy, or have blurred vision. Do not drive, use machinery, or do anything that needs mental alertness until you know how this medicine affects you. To reduce dizziness, do not sit or stand up quickly, especially if you are an older patient. Alcohol can increase drowsiness and dizziness. Avoid alcoholic drinks. If you notice blurred vision, eye pain, or other eye problems, seek medical attention at once for an eye exam. The use of this medicine may increase the chance of suicidal thoughts or actions. Pay special attention to how you are responding while on this medicine. Any worsening of mood, or thoughts of suicide or dying should be reported to your health care professional right away. This medicine may increase the chance of developing metabolic acidosis. If left untreated, this can cause kidney stones, bone disease, or slowed growth in children. Symptoms  include breathing fast, fatigue, loss of appetite, irregular heartbeat, or loss of consciousness. Call your doctor immediately if you experience any of these side effects. Also, tell your doctor about any surgery you plan on having while taking this medicine since this may increase your risk for metabolic acidosis. Birth control pills may not work properly while you are taking this medicine. Talk to your doctor about using an extra method of birth control. Women who become pregnant while using this medicine may enroll in the [place] Pregnancy Registry by calling [phone]. This registry collects information about the safety of antiepileptic drug use during pregnancy. What side effects may I notice from receiving this medicine? Side effects that you should report to your doctor or health care professional as soon as possible: -allergic reactions like skin rash, itching or hives, swelling of the face, lips, or tongue -decreased sweating and/or rise in body temperature -depression -difficulty breathing, fast or irregular breathing patterns -difficulty speaking -difficulty walking or controlling muscle movements -hearing impairment -redness, blistering, peeling or loosening of the skin, including inside the mouth -tingling, pain or numbness in the hands or feet -unusual bleeding or bruising -unusually weak or tired -worsening of mood, thoughts or actions of suicide or dying Side effects that usually do not require medical attention (report to your doctor or health care professional if  they continue or are bothersome): -altered taste -back pain, joint or muscle aches and pains -diarrhea, or constipation -headache -loss of appetite -nausea -stomach upset, indigestion -tremors This list may not describe all possible side effects. Call your doctor for medical advice about side effects. You may report side effects to FDA at 1-800-FDA-1088. Where should I keep my  medicine? Keep out of the reach of children. Store at room temperature between 15 and 30 degrees C (59 and 86 degrees F) in a tightly closed container. Protect from moisture. Throw away any unused medicine after the expiration date. NOTE: This sheet is a summary. It may not cover all possible information. If you have questions about this medicine, talk to your doctor, pharmacist, or health care provider.  2018 Elsevier/Gold Standard (2013-01-30 23:17:57)    General eating tips  What to Avoid . Avoid added sugars o Often added sugar can be found in processed foods such as many condiments, dry cereals, cakes, cookies, chips, crisps, crackers, candies, sweetened drinks, etc.  o Read labels and AVOID/DECREASE use of foods with the following in their ingredient list: Sugar, fructose, high fructose corn syrup, sucrose, glucose, maltose, dextrose, molasses, cane sugar, brown sugar, any type of syrup, agave nectar, etc.   . Avoid snacking in between meals- drink water or if you feel you need a snack, pick a high water content snack such as cucumbers, watermelon, or any veggie.  Marland Kitchen Avoid foods made with flour o If you are going to eat food made with flour, choose those made with whole-grains; and, minimize your consumption as much as is tolerable . Avoid processed foods o These foods are generally stocked in the middle of the grocery store.  o Focus on shopping on the perimeter of the grocery.  What to Include . Vegetables o GREEN LEAFY VEGETABLES: Kale, spinach, mustard greens, collard greens, cabbage, broccoli, etc. o OTHER: Asparagus, cauliflower, eggplant, carrots, peas, Brussel sprouts, tomatoes, bell peppers, zucchini, beets, cucumbers, etc. . Grains, seeds, and legumes o Beans: kidney beans, black eyed peas, garbanzo beans, black beans, pinto beans, etc. o Whole, unrefined grains: brown rice, barley, bulgur, oatmeal, etc. . Healthy fats  o Avoid highly processed fats such as vegetable  oil o Examples of healthy fats: avocado, olives, virgin olive oil, dark chocolate (?72% Cocoa), nuts (peanuts, almonds, walnuts, cashews, pecans, etc.) o Please still do small amount of these healthy fats, they are dense in calories.  . Low - Moderate Intake of Animal Sources of Protein o Meat sources: chicken, [place], salmon, tuna. Limit to 4 ounces of meat at one time or the size of your palm. o Consider limiting dairy sources, but when choosing dairy focus on: PLAIN [place] yogurt, cottage cheese, high-protein milk . Fruit o Choose berries

## 2019-07-28 LAB — HEMOGLOBIN A1C
Hgb A1c MFr Bld: 9.1 % of total Hgb — ABNORMAL HIGH (ref <5.7)
Mean Plasma Glucose: 214 (calc)
eAG (mmol/L): 11.9 (calc)

## 2019-07-28 LAB — LIPID PANEL
Cholesterol: 174 mg/dL (ref <200)
HDL: 33 mg/dL — ABNORMAL LOW (ref >=50)
Non-HDL Cholesterol (Calc): 141 mg/dL (calc) — ABNORMAL HIGH (ref <130)
Total CHOL/HDL Ratio: 5.3 (calc) — ABNORMAL HIGH (ref <5.0)
Triglycerides: 406 mg/dL — ABNORMAL HIGH (ref <150)

## 2019-07-28 LAB — CBC WITH DIFFERENTIAL/PLATELET
Absolute Monocytes: 281 cells/uL (ref 200–950)
Basophils Absolute: 71 cells/uL (ref 0–200)
Basophils Relative: 1.4 %
Eosinophils Absolute: 230 cells/uL (ref 15–500)
Eosinophils Relative: 4.5 %
HCT: 43.1 % (ref 35.0–45.0)
Hemoglobin: 14.5 g/dL (ref 11.7–15.5)
Lymphs Abs: 1484 cells/uL (ref 850–3900)
MCH: 33 pg (ref 27.0–33.0)
MCHC: 33.6 g/dL (ref 32.0–36.0)
MCV: 98 fL (ref 80.0–100.0)
MPV: 10.7 fL (ref 7.5–12.5)
Monocytes Relative: 5.5 %
Neutro Abs: 3035 cells/uL (ref 1500–7800)
Neutrophils Relative %: 59.5 %
Platelets: 185 10*3/uL (ref 140–400)
RBC: 4.4 10*6/uL (ref 3.80–5.10)
RDW: 13 % (ref 11.0–15.0)
Total Lymphocyte: 29.1 %
WBC: 5.1 10*3/uL (ref 3.8–10.8)

## 2019-07-28 LAB — COMPLETE METABOLIC PANEL WITH GFR
AG Ratio: 2.3 (calc) (ref 1.0–2.5)
ALT: 26 U/L (ref 6–29)
AST: 19 U/L (ref 10–35)
Albumin: 4.4 g/dL (ref 3.6–5.1)
Alkaline phosphatase (APISO): 83 U/L (ref 37–153)
BUN: 15 mg/dL (ref 7–25)
CO2: 26 mmol/L (ref 20–32)
Calcium: 9.8 mg/dL (ref 8.6–10.4)
Chloride: 104 mmol/L (ref 98–110)
Creat: 0.59 mg/dL (ref 0.50–0.99)
GFR, Est African American: 113 mL/min/{1.73_m2} (ref >=60)
GFR, Est Non African American: 98 mL/min/{1.73_m2} (ref >=60)
Globulin: 1.9 g/dL (calc) (ref 1.9–3.7)
Glucose, Bld: 202 mg/dL — ABNORMAL HIGH (ref 65–99)
Potassium: 4.3 mmol/L (ref 3.5–5.3)
Sodium: 139 mmol/L (ref 135–146)
Total Bilirubin: 0.6 mg/dL (ref 0.2–1.2)
Total Protein: 6.3 g/dL (ref 6.1–8.1)

## 2019-07-28 LAB — TSH: TSH: 0.66 mIU/L (ref 0.40–4.50)

## 2019-08-21 ENCOUNTER — Other Ambulatory Visit: Payer: Self-pay

## 2019-08-21 ENCOUNTER — Ambulatory Visit
Admission: RE | Admit: 2019-08-21 | Discharge: 2019-08-21 | Disposition: A | Discharge status: Home / Self Care | Payer: BC Managed Care – PPO | Source: Ambulatory Visit | Attending: Physician Assistant | Admitting: Physician Assistant

## 2019-08-21 DIAGNOSIS — G589 Mononeuropathy, unspecified: Secondary | ICD-10-CM

## 2019-08-21 DIAGNOSIS — R079 Chest pain, unspecified: Secondary | ICD-10-CM | POA: Diagnosis not present

## 2019-08-22 ENCOUNTER — Other Ambulatory Visit: Payer: Self-pay | Admitting: Physician Assistant

## 2019-08-22 MED ORDER — PREGABALIN 50 MG PO CAPS
50.0000 mg | ORAL_CAPSULE | Freq: Three times a day (TID) | ORAL | 2 refills | Status: DC
Start: 2019-08-22 — End: 2019-12-04

## 2019-09-13 ENCOUNTER — Other Ambulatory Visit: Payer: Self-pay | Admitting: Internal Medicine

## 2019-09-28 DIAGNOSIS — G4733 Obstructive sleep apnea (adult) (pediatric): Secondary | ICD-10-CM | POA: Diagnosis not present

## 2019-10-15 ENCOUNTER — Other Ambulatory Visit: Payer: Self-pay | Admitting: Internal Medicine

## 2019-10-15 DIAGNOSIS — M1A9XX0 Chronic gout, unspecified, without tophus (tophi): Secondary | ICD-10-CM

## 2019-10-19 DIAGNOSIS — M5412 Radiculopathy, cervical region: Secondary | ICD-10-CM | POA: Diagnosis not present

## 2019-10-26 DIAGNOSIS — M4802 Spinal stenosis, cervical region: Secondary | ICD-10-CM | POA: Diagnosis not present

## 2019-10-26 DIAGNOSIS — M542 Cervicalgia: Secondary | ICD-10-CM | POA: Diagnosis not present

## 2019-10-26 DIAGNOSIS — M5412 Radiculopathy, cervical region: Secondary | ICD-10-CM | POA: Diagnosis not present

## 2019-10-31 DIAGNOSIS — M5412 Radiculopathy, cervical region: Secondary | ICD-10-CM | POA: Diagnosis not present

## 2019-10-31 NOTE — Progress Notes (Signed)
FOLLOW UP  Assessment and Plan:  Mononueorapthy Likely from neck- going to have surgery  Hypertension Well controlled with current medications  Monitor blood pressure at home; patient to call if consistently greater than 130/80 Continue DASH diet.   Reminder to go to the ER if any CP, SOB, nausea, dizziness, severe HA, changes vision/speech, left arm numbness and tingling and jaw pain.  Cholesterol Currently at LDL goal Continue low cholesterol diet and exercise.  Check lipid panel.   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  Perform daily foot/skin check, notify office of any concerning changes.  Check A1C  Obesity with co morbidities Long discussion about weight loss, diet, and exercise Recommended diet heavy in fruits and veggies and low in animal meats, cheeses, and dairy products, appropriate calorie intake Discussed ideal weight for height  Cut out soda; switch to stevia sweetened Will follow up in 3 months  Vitamin D Def Continue supplementation Defer Vit D level  Gout Continue allopurinol Diet discussed Check uric acid as needed  Elevated LFTs Suspect fatty liver  Continue diet and meds as discussed. Further disposition pending results of labs. Discussed med's effects and SE's.   Over 30 minutes of exam, counseling, chart review, and critical decision making was performed.   Future Appointments  Date Time Provider [place]  04/25/2020  9:00 AM Liane Comber, NP [facility] None    ----------------------------------------------------------------------------------------------------------------------  HPI 64 y.o. female  presents for 3 month follow up on hypertension, cholesterol, diabetes, obesity, gout and vitamin D  deficiency.   She has had chest pain intermittent x years, had negative cardiovascular work up. Following with Dr. Ronnald Ramp at [facility] , had another MRI of her cervical spine and he suggest surgery, plan on doing that in Nov. Had ESI x 2 recently, last one helped some.   She had thyroid nodules on MRI, had follow up US on thyroid and only 1 nodule meet criteria for 1 year follow up, none met criteria for biospy. Will get in 1 year.  Going to lose insurance in 3 months and going to [employer] plan  BMI is Body mass index is 30.89 kg/m., she is working on diet and exercise. She has not been on the phentermine.  Wt Readings from Last 3 Encounters:  11/01/19 174 lb 6.4 oz (79.1 kg)  07/27/19 173 lb (78.5 kg)  04/24/19 182 lb 12.8 oz (82.9 kg)   Her blood pressure has been controlled at home, today their BP is BP: 124/80  She does workout. She denies chest pain, shortness of breath, dizziness.   She is not on cholesterol medication due to excellent control with fish oil supplement. Her LDL cholesterol is at goal. Trigs remain elevated. She does admit to some soda intake recently. The cholesterol last visit was:    Lab Results  Component Value Date   CHOL 174 07/27/2019   HDL 33 (L) 07/27/2019   [place]  07/27/2019     Comment:     . LDL cholesterol not calculated. Triglyceride levels greater than 400 mg/dL invalidate calculated LDL results. . Reference range: <100 . Desirable range <100 mg/dL for primary prevention;   <70 mg/dL for patients with CHD or diabetic patients  with > or = 2 CHD risk factors. Marland Kitchen LDL-C is now calculated using  the Martin-Hopkins  calculation, which is a validated novel method providing  better accuracy than the Friedewald equation in the  estimation of LDL-C.  Cresenciano Genre et al. Annamaria Helling. 2778;242(35): 2061-2068  (http://education.[facility].com/faq/FAQ164)    TRIG 406 (H) 07/27/2019   CHOLHDL 5.3 (H) 07/27/2019    She has been  working on diet and exercise for T2 diabetes  on TRIJYNTA - only taking 1000 mg amaryl 4 mg, taking if it is over 150 or 200- has had to take due to recent steroid injections She does have CKD on ARB She has chest neuropathy- and denies foot ulcerations, increased appetite, nausea, paresthesia of the feet, visual disturbances, vomiting and weight loss.   Last A1C in the office was:  Lab Results  Component Value Date   HGBA1C 9.1 (H) 07/27/2019   Lab Results  Component Value Date   GFRNONAA 98 07/27/2019   Patient is on Vitamin D supplement.   Lab Results  Component Value Date   VD25OH 57 04/24/2019    Patient is on allopurinol for gout and does not report a recent flare.  Lab Results  Component Value Date   LABURIC 6.0 04/05/2017     Current Medications:  Current Outpatient Medications on File Prior to Visit  Medication Sig  . ACCU-CHEK AVIVA PLUS test strip TEST SUGAR ONCE DAILY DX 11.9  . Accu-Chek Softclix Lancets lancets Test Blood Sugar Daily (Dx: E11.9)  . allopurinol (ZYLOPRIM) 100 MG tablet Take    1 tablet      Daily      to Prevent Gout  . aspirin 81 MG tablet Take 81 mg by mouth daily.  . Blood Glucose Monitoring Suppl (ACCU-CHEK AVIVA PLUS) w/Device KIT Test sugar once daily 11.9  . celecoxib (CELEBREX) 200 MG capsule Take 1 capsule (200 mg total) by mouth 2 (two) times daily.  . Cholecalciferol (VITAMIN D) 2000 UNITS tablet Take 2,000 Units by mouth 2 (two) times daily.  . Empagliflozin-Linaglip-Metform (TRIJARDY XR) 25-06-998 MG TB24 1 PILL A DAY FOR DIABETES  . gabapentin (NEURONTIN) 300 MG capsule 1 capsule in the morning, 2 capsules at lunch, and 3 capsules at bedtime.  Marland Kitchen glimepiride (AMARYL) 4 MG tablet Take 2 tablets Daily with Meals for Diabetes  . losartan (COZAAR) 50 MG tablet TAKE 1 TABLET BY MOUTH EVERY DAY  . metFORMIN (GLUCOPHAGE-XR) 500 MG 24 hr tablet Take 1 tablet 2 x /day with Meals for Diabetes  . Multiple Vitamin (MULTIVITAMIN) capsule Take 1  capsule by mouth daily.  . Omega-3 Fatty Acids (FISH OIL) 1000 MG CAPS Take by mouth daily.  . phentermine (ADIPEX-P) 37.5 MG tablet Take 1 tablet (37.5 mg total) by mouth daily before breakfast.  . pregabalin (LYRICA) 50 MG capsule Take 1 capsule (50 mg total) by mouth 3 (three) times daily.  Marland Kitchen topiramate (TOPAMAX) 50 MG tablet Take 1 tablet (50 mg total) by mouth 2 (two) times daily.   No current facility-administered medications on file prior to visit.     Allergies:  Allergies  Allergen Reactions  . Clinoril [Sulindac]   . Erythromycin   . Shrimp [Shellfish Allergy]      Medical History:  Past Medical History:  Diagnosis Date  . Anemia   . Anxiety   . Colon polyps   . Diabetes mellitus without complication ([place])   . Gout   . Hypertriglyceridemia   . Obstructive sleep apnea on CPAP    uses CPAP nightly  . Secondary adhesive capsulitis of right shoulder 10/10/2015  Family history- Reviewed and unchanged Social history- Reviewed and unchanged   Review of Systems:  Review of Systems  Constitutional: Negative for malaise/fatigue and weight loss.  HENT: Negative for hearing loss and tinnitus.   Eyes: Negative for blurred vision and double vision.  Respiratory: Negative for cough, shortness of breath and wheezing.   Cardiovascular: Negative for chest pain, palpitations, orthopnea, claudication and leg swelling.  Gastrointestinal: Negative for abdominal pain, blood in stool, constipation, diarrhea, heartburn, melena, nausea and vomiting.  Genitourinary: Negative.   Musculoskeletal: Negative for joint pain and myalgias.  Skin: Negative for rash.  Neurological: Negative for dizziness, tingling, sensory change, weakness and headaches.  Endo/Heme/Allergies: Negative for polydipsia.  Psychiatric/Behavioral: Negative.   All other systems reviewed and are negative.    Physical Exam: BP 124/80   Pulse 88   Temp 97.7 F (36.5 C)   Wt 174 lb 6.4 oz (79.1 kg)   SpO2 97%    BMI 30.89 kg/m  Wt Readings from Last 3 Encounters:  11/01/19 174 lb 6.4 oz (79.1 kg)  07/27/19 173 lb (78.5 kg)  04/24/19 182 lb 12.8 oz (82.9 kg)   General Appearance: Well nourished, in no apparent distress. Eyes: PERRLA, EOMs, conjunctiva no swelling or erythema Sinuses: No Frontal/maxillary tenderness ENT/Mouth: Ext aud canals clear, TMs without erythema, bulging. No erythema, swelling, or exudate on post pharynx.  Tonsils not swollen or erythematous. Hearing normal.  Neck: Supple, thyroid normal.  Respiratory: Respiratory effort normal, BS equal bilaterally without rales, rhonchi, wheezing or stridor.  Cardio: RRR with no MRGs. Brisk peripheral pulses without edema.  Abdomen: Soft, + BS.  Non tender, no guarding, rebound, hernias, masses. Lymphatics: Non tender without lymphadenopathy.  Musculoskeletal: Full ROM, 5/5 strength, Normal gait Skin: Warm, dry without rashes, lesions, ecchymosis. aqa Neuro: Cranial nerves intact. No cerebellar symptoms.  Psych: Awake and oriented X 3, normal affect, Insight and Judgment appropriate.    Vicie Mutters, PA-C 8:56 AM [facility]

## 2019-11-01 ENCOUNTER — Encounter: Payer: Self-pay | Admitting: Physician Assistant

## 2019-11-01 ENCOUNTER — Ambulatory Visit: Payer: BC Managed Care – PPO | Admitting: Physician Assistant

## 2019-11-01 ENCOUNTER — Other Ambulatory Visit: Payer: Self-pay

## 2019-11-01 VITALS — BP 124/80 | HR 88 | Temp 97.7°F | Wt 174.4 lb

## 2019-11-01 DIAGNOSIS — E1141 Type 2 diabetes mellitus with diabetic mononeuropathy: Secondary | ICD-10-CM | POA: Diagnosis not present

## 2019-11-01 DIAGNOSIS — E1169 Type 2 diabetes mellitus with other specified complication: Secondary | ICD-10-CM

## 2019-11-01 DIAGNOSIS — Z1152 Encounter for screening for COVID-19: Secondary | ICD-10-CM

## 2019-11-01 DIAGNOSIS — E669 Obesity, unspecified: Secondary | ICD-10-CM

## 2019-11-01 DIAGNOSIS — E785 Hyperlipidemia, unspecified: Secondary | ICD-10-CM

## 2019-11-01 DIAGNOSIS — I10 Essential (primary) hypertension: Secondary | ICD-10-CM

## 2019-11-01 DIAGNOSIS — E1122 Type 2 diabetes mellitus with diabetic chronic kidney disease: Secondary | ICD-10-CM

## 2019-11-01 DIAGNOSIS — N182 Chronic kidney disease, stage 2 (mild): Secondary | ICD-10-CM

## 2019-11-01 MED ORDER — TRULICITY 1.5 MG/0.5ML ~~LOC~~ SOAJ
1.5000 mg | SUBCUTANEOUS | 0 refills | Status: DC
Start: 2019-11-01 — End: 2020-04-24

## 2019-11-01 MED ORDER — PHENTERMINE HCL 37.5 MG PO TABS: 37.5000 mg | ORAL_TABLET | Freq: Every day | ORAL | 2 refills | Status: DC

## 2019-11-01 NOTE — Patient Instructions (Addendum)
Check to see if insurance covers ozempic  Start TRULICITY injection as shown once a week.  The starting dose is 0.75 mg on the pen for the first 4 weeks.   You may inject in the stomach, thigh or arm.  You may experience nausea in the first few days which usually goes away.    You will feel fullness of the stomach with starting the medication and should try to keep the portions at meals small.  After 4 weeks increase the dose to 1.5mg  once weekly if no nausea present.    Use the coupon for this and then increase to the 3 mg once weekly  If any questions or concerns are present call the office  Please check blood sugars at least half the time about 2 hours after any meal and 3 times per week on waking up. Please bring blood sugar monitor to each visit. Recommended blood sugar levels about 2 hours after meal is 140-180 and on waking up 90-130  You can stay on the trujynta Likely you will not need the glimeperide.

## 2019-11-02 LAB — COMPLETE METABOLIC PANEL WITH GFR
AG Ratio: 2 (calc) (ref 1.0–2.5)
ALT: 27 U/L (ref 6–29)
AST: 20 U/L (ref 10–35)
Albumin: 4.2 g/dL (ref 3.6–5.1)
Alkaline phosphatase (APISO): 81 U/L (ref 37–153)
BUN: 14 mg/dL (ref 7–25)
CO2: 26 mmol/L (ref 20–32)
Calcium: 9.9 mg/dL (ref 8.6–10.4)
Chloride: 103 mmol/L (ref 98–110)
Creat: 0.61 mg/dL (ref 0.50–0.99)
GFR, Est African American: 111 mL/min/{1.73_m2} (ref >=60)
GFR, Est Non African American: 96 mL/min/{1.73_m2} (ref >=60)
Globulin: 2.1 g/dL (calc) (ref 1.9–3.7)
Glucose, Bld: 171 mg/dL — ABNORMAL HIGH (ref 65–99)
Potassium: 4.2 mmol/L (ref 3.5–5.3)
Sodium: 140 mmol/L (ref 135–146)
Total Bilirubin: 0.4 mg/dL (ref 0.2–1.2)
Total Protein: 6.3 g/dL (ref 6.1–8.1)

## 2019-11-02 LAB — HEMOGLOBIN A1C
Hgb A1c MFr Bld: 8.6 % of total Hgb — ABNORMAL HIGH (ref <5.7)
Mean Plasma Glucose: 200 (calc)
eAG (mmol/L): 11.1 (calc)

## 2019-11-02 LAB — LIPID PANEL
Cholesterol: 166 mg/dL (ref <200)
HDL: 37 mg/dL — ABNORMAL LOW (ref >=50)
LDL Cholesterol (Calc): 84 mg/dL (calc)
Non-HDL Cholesterol (Calc): 129 mg/dL (calc) (ref <130)
Total CHOL/HDL Ratio: 4.5 (calc) (ref <5.0)
Triglycerides: 377 mg/dL — ABNORMAL HIGH (ref <150)

## 2019-11-02 LAB — SARS-COV-2 ANTIBODY(IGG)SPIKE,SEMI-QUANTITATIVE: SARS COV1 AB(IGG)SPIKE,SEMI QN: 4.92 index — ABNORMAL HIGH (ref <1.00)

## 2019-11-24 ENCOUNTER — Other Ambulatory Visit: Payer: Self-pay | Admitting: Adult Health

## 2019-11-24 MED ORDER — SULFAMETHOXAZOLE-TRIMETHOPRIM 800-160 MG PO TABS: 1.0000 | ORAL_TABLET | Freq: Two times a day (BID) | ORAL | 0 refills | Status: DC

## 2019-11-24 MED ORDER — SULFAMETHOXAZOLE-TRIMETHOPRIM 800-160 MG PO TABS
1.0000 | ORAL_TABLET | Freq: Two times a day (BID) | ORAL | 0 refills | Status: DC
Start: 2019-11-24 — End: 2019-11-24

## 2019-11-24 NOTE — Progress Notes (Signed)
64 y.o. patient out of state in [place], [place] called to report cloudy urine, dysuria x 2 days, typical UTI sx for her, requesting abx to local pharmacy. Denies any other atypical sx, vaginal discharge. No recent UTI of abx use. Has been taking azo. Sending in bactrim DS to local pharmacy, advised stop azo, push fluids, sx should improve in 12-24 hours; if not improving present to UC for UA/culture check. She is appreciative and expresses understanding.

## 2019-12-04 ENCOUNTER — Other Ambulatory Visit: Payer: Self-pay | Admitting: Internal Medicine

## 2019-12-04 ENCOUNTER — Other Ambulatory Visit: Payer: Self-pay | Admitting: Adult Health Nurse Practitioner

## 2019-12-04 DIAGNOSIS — G589 Mononeuropathy, unspecified: Secondary | ICD-10-CM

## 2019-12-04 MED ORDER — PREGABALIN 50 MG PO CAPS: 50.0000 mg | ORAL_CAPSULE | Freq: Three times a day (TID) | ORAL | 3 refills | Status: DC

## 2019-12-04 MED ORDER — CELECOXIB 200 MG PO CAPS: ORAL_CAPSULE | ORAL | 0 refills | Status: DC

## 2019-12-07 DIAGNOSIS — E119 Type 2 diabetes mellitus without complications: Secondary | ICD-10-CM | POA: Diagnosis not present

## 2019-12-27 DIAGNOSIS — R197 Diarrhea, unspecified: Secondary | ICD-10-CM

## 2019-12-27 DIAGNOSIS — R112 Nausea with vomiting, unspecified: Secondary | ICD-10-CM | POA: Diagnosis not present

## 2019-12-27 MED ORDER — ONDANSETRON 8 MG PO TBDP: 8.0000 mg | ORAL_TABLET | Freq: Three times a day (TID) | ORAL | 0 refills | Status: AC | PRN

## 2019-12-27 NOTE — Telephone Encounter (Signed)
Virtual Visit via Telephone Note  I connected with Amanda Cooper on 12/27/2019 at 1645  by telephone and verified that I am speaking with the correct person using two identifiers.  Location: Patient: [facility], [place], on vacation Provider: Tammi Klippel e   I discussed the limitations, risks, security and privacy concerns of performing an evaluation and management service by telephone and the availability of in person appointments. I also discussed with the patient that there may be a patient responsible charge related to this service. The patient expressed understanding and agreed to proceed.   History of Present Illness:  64 y.o. T2DM currently on vacation in [place], [place] with family, requesting evaluation for nausea/vomiting and diarrhea.   She reports 7 days ago started having watery diarrhea, nausea, throwing up, took imodium that night, was feeling better, she reports the next AM feeling well, but then started having nausea and emesis, and diarrhea again. Was doing bland foods and imodium that seemed to help, symptoms intermittent over the last 5 days, seemed to be improving then yesterday evening again started feeling nauseous with throwing up and watery diarrhea.   She is eating same food family is eating, no other family members are ill. No unusual foods prior to onset. She denies fever/chills, abdominal pain (other than mild cramping with diarrhea). Denies HA, myalgias. She describes diarrhea as watery, without mucus, blood, not black/tarry, appears green, with foul odor). Emesis when having is fairly severe. This afternoon she is feeling mildly improved, is doing well holding down ice chips, juice.   She stopped all meds due to taking meds causing nausesa. Denies new meds other than she did take a course of bactrim last month for UTI, denies having problems with that abx at the time.   She denies fever/chills, denies abdominal pain  HA, dizziness, confusion Denies urinary  changes, vaginal sx  Denies alcohol intake, NSAIDs, no reflux sx  No recent relevant imaging for review; hx of colon polyps, overdue for follow up and patient has been declining due to high deductible insurance, planning to pursue as soon as she gets on medication  Allergies:  Allergies  Allergen Reactions  . Clinoril [Sulindac]   . Erythromycin   . Shrimp [Shellfish Allergy]    Medical History:  has Hyperlipidemia associated with type 2 diabetes mellitus ([place]); CKD stage 2 due to type 2 diabetes mellitus ([place]); Anxiety; Colon polyps; Essential hypertension; Gout; Obstructive sleep apnea on CPAP; Vitamin D deficiency; Secondary adhesive capsulitis of right shoulder; Stress incontinence in female; Morbid obesity ([place]); Diabetes mellitus with diabetic mononeuropathy ([place]); Fatty infiltration of liver; and Multinodular goiter on their problem list. Surgical History:  She  has a past surgical history that includes Abdominal hysterectomy; Breast surgery (Left); Ovary surgery (Left, 1999); achilles (Right, 2009); Eye surgery; Shoulder arthroscopy (Right, 2017); and Foot surgery (Left, 06/16/2017). Family History:  Herfamily history includes Alcohol abuse in her father; COPD in her mother; Depression in her mother; Diabetes in her sister; Heart attack (age of onset: 69) in her father; Heart disease in her father. Social History:   reports that she has never smoked. She has never used smokeless tobacco. She reports that she does not drink alcohol and does not use drugs.    Observations/Objective:  General : Well sounding patient in no apparent distress HEENT: no hoarseness, no cough for duration of visit Lungs: speaks in complete sentences, no audible wheezing, no apparent distress Neurological: alert, oriented x 3 Psychiatric: pleasant, judgement appropriate  Assessment and Plan:  Diagnoses and all orders for this visit:  Nausea vomiting and diarrhea Discussed limitations in diagnosis  of GI sx via telephone Does not appear to have red flags, stable and mildly improving sx this afternoon She did have recent course of abx, cannot r/t c. Diff - advised hold off on imodium, will give zofran ODT for nausea, emphasized liquid/bland diet for GI rest and close monitoring Continue to hold all meds/supplements, monitor BP and glucose closely If any worse, sx of dehydration - ED evaluation Push ice chips, broths, pedialyte if needed She is returning to town this weekend, if not resolving, present to office for in person evaluation, labs and stool pathogen panel and r/o c. Diff She expresses understanding of limitations and in agreement with above plan with close monitoring  Please go to the ER if you have any severe AB pain, unable to hold down food/water, blood in stool or vomit, chest pain, shortness of breath, or any worsening symptoms.  -     ondansetron (ZOFRAN-ODT) 8 MG disintegrating tablet; Take 1 tablet (8 mg total) by mouth every 8 (eight) hours as needed for nausea or vomiting.   Follow Up Instructions:    I discussed the assessment and treatment plan with the patient. The patient was provided an opportunity to ask questions and all were answered. The patient agreed with the plan and demonstrated an understanding of the instructions.   The patient was advised to call back or seek an in-person evaluation if the symptoms worsen or if the condition fails to improve as anticipated.  I provided 15 minutes of non-face-to-face time during this encounter.   Amanda Ribas, NP

## 2020-01-03 ENCOUNTER — Other Ambulatory Visit: Payer: Self-pay | Admitting: Adult Health

## 2020-01-03 ENCOUNTER — Other Ambulatory Visit (INDEPENDENT_AMBULATORY_CARE_PROVIDER_SITE_OTHER): Payer: BC Managed Care – PPO

## 2020-01-03 ENCOUNTER — Other Ambulatory Visit: Payer: Self-pay

## 2020-01-03 DIAGNOSIS — R197 Diarrhea, unspecified: Secondary | ICD-10-CM

## 2020-01-08 DIAGNOSIS — R197 Diarrhea, unspecified: Secondary | ICD-10-CM | POA: Diagnosis not present

## 2020-01-10 DIAGNOSIS — M4712 Other spondylosis with myelopathy, cervical region: Secondary | ICD-10-CM | POA: Diagnosis not present

## 2020-01-10 DIAGNOSIS — M4722 Other spondylosis with radiculopathy, cervical region: Secondary | ICD-10-CM | POA: Diagnosis not present

## 2020-01-10 HISTORY — PX: ANTERIOR CERVICAL DECOMP/DISCECTOMY FUSION: SHX1161

## 2020-01-12 LAB — GASTROINTESTINAL PATHOGEN PANEL PCR
C. difficile Tox A/B, PCR: NOT DETECTED
Campylobacter, PCR: NOT DETECTED
Cryptosporidium, PCR: NOT DETECTED
E coli (ETEC) LT/ST PCR: NOT DETECTED
E coli (STEC) stx1/stx2, PCR: NOT DETECTED
E coli 0157, PCR: NOT DETECTED
Giardia lamblia, PCR: NOT DETECTED
Norovirus, PCR: NOT DETECTED
Rotavirus A, PCR: NOT DETECTED
Salmonella, PCR: NOT DETECTED
Shigella, PCR: NOT DETECTED

## 2020-01-12 LAB — CALPROTECTIN: Calprotectin: 75 mcg/g

## 2020-01-25 DIAGNOSIS — M25612 Stiffness of left shoulder, not elsewhere classified: Secondary | ICD-10-CM | POA: Diagnosis not present

## 2020-01-31 ENCOUNTER — Other Ambulatory Visit: Payer: Self-pay | Admitting: Internal Medicine

## 2020-01-31 DIAGNOSIS — M1A9XX0 Chronic gout, unspecified, without tophus (tophi): Secondary | ICD-10-CM

## 2020-01-31 NOTE — Progress Notes (Signed)
FOLLOW UP  Assessment and Plan:    Hypertension Well controlled with current medications  Monitor blood pressure at home; patient to call if consistently greater than 130/80 Continue DASH diet.   Reminder to go to the ER if any CP, SOB, nausea, dizziness, severe HA, changes vision/speech, left arm numbness and tingling and jaw pain.  Cholesterol Currently mildly above goal of LDL <70 Deferring statin with shared decision making due to typically close to goal values, currently working on lifestyle changes Plan to add statin if any vascular dx Continue low cholesterol diet and exercise.  Check lipid panel.   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  Perform daily foot/skin check, notify office of any concerning changes.  Check A1C  Overweight with co morbidities Long discussion about weight loss, diet, and exercise Recommended diet heavy in fruits and veggies and low in animal meats, cheeses, and dairy products, appropriate calorie intake Discussed ideal weight for height  Cut out soda; switch to stevia sweetened Will follow up in 3 months  Vitamin D Def Continue supplementation Defer Vit D level  Gout Continue allopurinol Diet discussed Check uric acid as needed  Multinodular goiter Due 1 year [place] - ordered  Continue diet and meds as discussed. Further disposition pending results of labs. Discussed med's effects and SE's.   Over 30 minutes of exam, counseling, chart review, and critical decision making was performed.   Future Appointments  Date Time Provider [place]  04/25/2020  9:00 AM Liane Comber, NP [facility] None    ----------------------------------------------------------------------------------------------------------------------  HPI 64 y.o. female  presents for 3 month  follow up on hypertension, cholesterol, diabetes, obesity, gout and vitamin D deficiency.   She has had chest pain intermittent x years, had negative cardiovascular work up. Following with Dr. Ronnald Ramp at [facility] , underwent anterior C5-C6 discectomy and fusion and has done well since, tapering off of lyrica. Reports chest pain resolved.   Follows with Dr. Archie Endo office for R shoulder, rotator cuff tendonitis and adhesive capsulitis, getting steroid injections with some improvement, taking celebrex.   She had thyroid nodules on MRI, had follow up US on thyroid 10/2018 and only 1 nodule meet criteria for 1 year follow up, none met criteria for biospy.   She recently had persistent diarrhea following travel with family, GI pathogen panel 01/08/2020, had borderline calprotectin of 75 and recommended for 4-6 week recheck, today states no diarrhea, will defer this test.   BMI is Body mass index is 28.34 kg/m., she is working on diet and exercise. She has not been on the phentermine.  Wt Readings from Last 3 Encounters:  02/01/20 160 lb (72.6 kg)  11/01/19 174 lb 6.4 oz (79.1 kg)  07/27/19 173 lb (78.5 kg)   Her blood pressure has been controlled at home, today their BP is BP: 118/70  She does workout. She denies chest pain, shortness of breath, dizziness.   She is not on cholesterol medication due to excellent control with fish oil supplement. Her LDL cholesterol is at goal. Trigs remain elevated. She does admit to some soda intake recently. The cholesterol last visit was:    Lab Results  Component Value Date   CHOL 166 11/01/2019   HDL 37 (L) 11/01/2019   LDLCALC 84 11/01/2019   TRIG 377 (H) 11/01/2019   CHOLHDL 4.5 11/01/2019    She has been  working on diet and exercise for T2 diabetes  on TRIJARDY - and 1000 mg metformin in the evening  Trulicity 3.0 mg weekly  amaryl 4 mg, taking if it is over 150 (1 tab) or 200 (2 tabs) - has had to take due to recent steroid  injections She does have CKD on ARB She has chest neuropathy- and denies foot ulcerations, increased appetite, nausea, paresthesia of the feet, visual disturbances, vomiting and weight loss.   Fasting ranging 126-170s, average 130s.  Last A1C in the office was:  Lab Results  Component Value Date   HGBA1C 8.6 (H) 11/01/2019   Lab Results  Component Value Date   GFRNONAA 96 11/01/2019   Patient is on Vitamin D supplement.   Lab Results  Component Value Date   VD25OH 57 04/24/2019    Patient is on allopurinol for gout and does not report a recent flare.  Lab Results  Component Value Date   LABURIC 6.0 04/05/2017     Current Medications:  Current Outpatient Medications on File Prior to Visit  Medication Sig  . ACCU-CHEK AVIVA PLUS test strip TEST SUGAR ONCE DAILY DX 11.9  . Accu-Chek Softclix Lancets lancets Test Blood Sugar Daily (Dx: E11.9)  . allopurinol (ZYLOPRIM) 100 MG tablet Take      1 tablet      Daily     to Prevent Gout  . aspirin 81 MG tablet Take 81 mg by mouth daily.  . Blood Glucose Monitoring Suppl (ACCU-CHEK AVIVA PLUS) w/Device KIT Test sugar once daily 11.9  . celecoxib (CELEBREX) 200 MG capsule Take      1 capsule       2 x /day       with a Meal       for Inflammation & Pain  . Cholecalciferol (VITAMIN D) 2000 UNITS tablet Take 2,000 Units by mouth 2 (two) times daily.  . Dulaglutide (TRULICITY) 1.5 FB/5.1WC SOPN Inject 1.5 mg into the skin once a week.  . Empagliflozin-Linaglip-Metform (TRIJARDY XR) 25-06-998 MG TB24 1 PILL A DAY FOR DIABETES  . glimepiride (AMARYL) 4 MG tablet Take 2 tablets Daily with Meals for Diabetes  . losartan (COZAAR) 50 MG tablet TAKE 1 TABLET BY MOUTH EVERY DAY  . metFORMIN (GLUCOPHAGE-XR) 500 MG 24 hr tablet Take      1 tablet      2 x /day      with Meals     for Diabetes  . Multiple Vitamin (MULTIVITAMIN) capsule Take 1 capsule by mouth daily.  . Omega-3 Fatty Acids (FISH OIL) 1000 MG CAPS Take by mouth daily.  . ondansetron  (ZOFRAN-ODT) 8 MG disintegrating tablet Take 1 tablet (8 mg total) by mouth every 8 (eight) hours as needed for nausea or vomiting.  . pregabalin (LYRICA) 50 MG capsule Take 1 capsule (50 mg total) by mouth 3 (three) times daily.  . phentermine (ADIPEX-P) 37.5 MG tablet Take 1 tablet (37.5 mg total) by mouth daily before breakfast. (Patient not taking: Reported on 02/01/2020)  . sulfamethoxazole-trimethoprim (BACTRIM DS) 800-160 MG tablet Take 1 tablet by mouth 2 (two) times daily.   No current facility-administered medications on file prior to visit.     Allergies:  Allergies  Allergen Reactions  . Clinoril [Sulindac]   . Erythromycin   . Shrimp [Shellfish Allergy]      Medical History:  Past Medical History:  Diagnosis Date  . Anemia   . Anxiety   . Colon polyps   .  Diabetes mellitus without complication ([place])   . Gout   . Hypertriglyceridemia   . Obstructive sleep apnea on CPAP    uses CPAP nightly  . 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  HENT: Negative for hearing loss and tinnitus.   Eyes: Negative for blurred vision and double vision.  Respiratory: Negative for cough, shortness of breath and wheezing.   Cardiovascular: Negative for chest pain, palpitations, orthopnea, claudication and leg swelling.  Gastrointestinal: Negative for abdominal pain, blood in stool, constipation, diarrhea, heartburn, melena, nausea and vomiting.  Genitourinary: Negative.   Musculoskeletal: Negative for joint pain and myalgias.  Skin: Negative for rash.  Neurological: Negative for dizziness, tingling, sensory change, weakness and headaches.  Endo/Heme/Allergies: Negative for polydipsia.  Psychiatric/Behavioral: Negative.   All other systems reviewed and are negative.    Physical  Exam: BP 118/70   Pulse 98   Temp (!) 97.3 F (36.3 C)   Ht 5' 3"  (1.6 m)   Wt 160 lb (72.6 kg)   SpO2 99%   BMI 28.34 kg/m  Wt Readings from Last 3 Encounters:  02/01/20 160 lb (72.6 kg)  11/01/19 174 lb 6.4 oz (79.1 kg)  07/27/19 173 lb (78.5 kg)   General Appearance: Well nourished, in no apparent distress. Eyes: PERRLA, EOMs, conjunctiva no swelling or erythema Sinuses: No Frontal/maxillary tenderness ENT/Mouth: Ext aud canals clear, TMs without erythema, bulging. No erythema, swelling, or exudate on post pharynx.  Tonsils not swollen or erythematous. Hearing normal.  Neck: Supple, thyroid with R sided nodules.  Respiratory: Respiratory effort normal, BS equal bilaterally without rales, rhonchi, wheezing or stridor.  Cardio: RRR with no MRGs. Brisk peripheral pulses without edema.  Abdomen: Soft, + BS.  Non tender, no guarding, rebound, hernias, masses. Lymphatics: Non tender without lymphadenopathy.  Musculoskeletal: Full ROM, 5/5 strength, Normal gait Skin: Warm, dry without rashes, lesions, ecchymosis. Well healed anterior neck surgical incision. Neuro: Cranial nerves intact. No cerebellar symptoms.  Psych: Awake and oriented X 3, normal affect, Insight and Judgment appropriate.    Izora Ribas, NP 8:59 AM [facility]

## 2020-02-01 ENCOUNTER — Encounter: Payer: Self-pay | Admitting: Adult Health

## 2020-02-01 ENCOUNTER — Other Ambulatory Visit: Payer: Self-pay

## 2020-02-01 ENCOUNTER — Ambulatory Visit (INDEPENDENT_AMBULATORY_CARE_PROVIDER_SITE_OTHER): Payer: BC Managed Care – PPO | Admitting: Adult Health

## 2020-02-01 ENCOUNTER — Telehealth: Payer: Self-pay

## 2020-02-01 VITALS — BP 118/70 | HR 98 | Temp 97.3°F | Ht 63.0 in | Wt 160.0 lb

## 2020-02-01 DIAGNOSIS — E1169 Type 2 diabetes mellitus with other specified complication: Secondary | ICD-10-CM | POA: Diagnosis not present

## 2020-02-01 DIAGNOSIS — E1122 Type 2 diabetes mellitus with diabetic chronic kidney disease: Secondary | ICD-10-CM

## 2020-02-01 DIAGNOSIS — E1141 Type 2 diabetes mellitus with diabetic mononeuropathy: Secondary | ICD-10-CM | POA: Diagnosis not present

## 2020-02-01 DIAGNOSIS — E785 Hyperlipidemia, unspecified: Secondary | ICD-10-CM | POA: Diagnosis not present

## 2020-02-01 DIAGNOSIS — E559 Vitamin D deficiency, unspecified: Secondary | ICD-10-CM | POA: Diagnosis not present

## 2020-02-01 DIAGNOSIS — Z79899 Other long term (current) drug therapy: Secondary | ICD-10-CM

## 2020-02-01 DIAGNOSIS — M1A9XX0 Chronic gout, unspecified, without tophus (tophi): Secondary | ICD-10-CM

## 2020-02-01 DIAGNOSIS — F419 Anxiety disorder, unspecified: Secondary | ICD-10-CM

## 2020-02-01 DIAGNOSIS — E042 Nontoxic multinodular goiter: Secondary | ICD-10-CM

## 2020-02-01 DIAGNOSIS — E663 Overweight: Secondary | ICD-10-CM

## 2020-02-01 DIAGNOSIS — I10 Essential (primary) hypertension: Secondary | ICD-10-CM

## 2020-02-01 DIAGNOSIS — N182 Chronic kidney disease, stage 2 (mild): Secondary | ICD-10-CM

## 2020-02-01 MED ORDER — TRULICITY 3 MG/0.5ML ~~LOC~~ SOAJ: 3.0000 mg | SUBCUTANEOUS | 1 refills | Status: DC

## 2020-02-01 MED ORDER — BLOOD GLUCOSE MONITORING SUPPL DEVI: 0 refills | Status: AC

## 2020-02-01 MED ORDER — FREESTYLE LITE TEST VI STRP: ORAL_STRIP | 12 refills | Status: AC

## 2020-02-01 MED ORDER — LANCETS MISC: 11 refills | Status: AC

## 2020-02-01 NOTE — Telephone Encounter (Signed)
Diabetic supplies-refill request

## 2020-02-01 NOTE — Patient Instructions (Addendum)
Goals    . Exercise 150 min/wk Moderate Activity    . HEMOGLOBIN A1C < 7    . Weight (lb) < 145 lb (65.8 kg)        YOU CAN CALL TO MAKE AN ULTRASOUND..  I have put in an order for an ultrasound for you to have You can set them up at your convenience by calling this number [phone] You will likely have the ultrasound at [place] [address]  If you have any issues call our office and we will set this up for you.         High-Fiber Diet Fiber, also called dietary fiber, is a type of carbohydrate that is found in fruits, vegetables, whole grains, and beans. A high-fiber diet can have many health benefits. Your health care provider may recommend a high-fiber diet to help:  Prevent constipation. Fiber can make your bowel movements more regular.  Lower your cholesterol.  Relieve the following conditions: ? Swelling of veins in the anus (hemorrhoids). ? Swelling and irritation (inflammation) of specific areas of the digestive tract (uncomplicated diverticulosis). ? A problem of the large intestine (colon) that sometimes causes pain and diarrhea (irritable bowel syndrome, IBS).  Prevent overeating as part of a weight-loss plan.  Prevent heart disease, type 2 diabetes, and certain cancers. What is my plan? The recommended daily fiber intake in grams (g) includes:  38 g for men age 11 or younger.  30 g for men over age 12.  62 g for women age 64 or younger.  21 g for women over age 61. You can get the recommended daily intake of dietary fiber by:  Eating a variety of fruits, vegetables, grains, and beans.  Taking a fiber supplement, if it is not possible to get enough fiber through your diet. What do I need to know about a high-fiber diet?  It is better to get fiber through food sources rather than from fiber supplements. There is not a lot of research about how effective supplements are.  Always check the fiber content on the nutrition facts label of any  prepackaged food. Look for foods that contain 5 g of fiber or more per serving.  Talk with a diet and nutrition specialist (dietitian) if you have questions about specific foods that are recommended or not recommended for your medical condition, especially if those foods are not listed below.  Gradually increase how much fiber you consume. If you increase your intake of dietary fiber too quickly, you may have bloating, cramping, or gas.  Drink plenty of water. Water helps you to digest fiber. What are tips for following this plan?  Eat a wide variety of high-fiber foods.  Make sure that half of the grains that you eat each day are whole grains.  Eat breads and cereals that are made with whole-grain flour instead of refined flour or white flour.  Eat brown rice, bulgur wheat, or millet instead of white rice.  Start the day with a breakfast that is high in fiber, such as a cereal that contains 5 g of fiber or more per serving.  Use beans in place of meat in soups, salads, and pasta dishes.  Eat high-fiber snacks, such as berries, raw vegetables, nuts, and popcorn.  Choose whole fruits and vegetables instead of processed forms like juice or sauce. What foods can I eat?  Fruits Berries. Pears. Apples. Oranges. Avocado. Prunes and raisins. Dried figs. Vegetables Sweet potatoes. Spinach. Kale. Artichokes.  Cabbage. Broccoli. Cauliflower. Green peas. Carrots. Squash. Grains Whole-grain breads. Multigrain cereal. Oats and oatmeal. Brown rice. Barley. Bulgur wheat. [place]. Quinoa. Bran muffins. Popcorn. Rye wafer crackers. Meats and other proteins Navy, kidney, and pinto beans. Soybeans. Split peas. Lentils. Nuts and seeds. Dairy Fiber-fortified yogurt. Beverages Fiber-fortified soy milk. Fiber-fortified orange juice. Other foods Fiber bars. The items listed above may not be a complete list of recommended foods and beverages. Contact a dietitian for more options. What foods are not  recommended? Fruits Fruit juice. Cooked, strained fruit. Vegetables Fried potatoes. Canned vegetables. Well-cooked vegetables. Grains White bread. Pasta made with refined flour. White rice. Meats and other proteins Fatty cuts of meat. Fried chicken or fried fish. Dairy Milk. Yogurt. Cream cheese. Sour cream. Fats and oils Butters. Beverages Soft drinks. Other foods Cakes and pastries. The items listed above may not be a complete list of foods and beverages to avoid. Contact a dietitian for more information. Summary  Fiber is a type of carbohydrate. It is found in fruits, vegetables, whole grains, and beans.  There are many health benefits of eating a high-fiber diet, such as preventing constipation, lowering blood cholesterol, helping with weight loss, and reducing your risk of heart disease, diabetes, and certain cancers.  Gradually increase your intake of fiber. Increasing too fast can result in cramping, bloating, and gas. Drink plenty of water while you increase your fiber.  The best sources of fiber include whole fruits and vegetables, whole grains, nuts, seeds, and beans. This information is not intended to replace advice given to you by your health care provider. Make sure you discuss any questions you have with your health care provider. Document Revised: 11/30/2016 Document Reviewed: 11/30/2016 Elsevier Patient Education  2020 [employer].

## 2020-02-02 LAB — MAGNESIUM: Magnesium: 2.1 mg/dL (ref 1.5–2.5)

## 2020-02-02 LAB — CBC WITH DIFFERENTIAL/PLATELET
Absolute Monocytes: 589 cells/uL (ref 200–950)
Basophils Absolute: 92 cells/uL (ref 0–200)
Basophils Relative: 1 %
Eosinophils Absolute: 285 cells/uL (ref 15–500)
Eosinophils Relative: 3.1 %
HCT: 44.3 % (ref 35.0–45.0)
Hemoglobin: 14.8 g/dL (ref 11.7–15.5)
Lymphs Abs: 2263 cells/uL (ref 850–3900)
MCH: 32.2 pg (ref 27.0–33.0)
MCHC: 33.4 g/dL (ref 32.0–36.0)
MCV: 96.3 fL (ref 80.0–100.0)
MPV: 10.5 fL (ref 7.5–12.5)
Monocytes Relative: 6.4 %
Neutro Abs: 5971 cells/uL (ref 1500–7800)
Neutrophils Relative %: 64.9 %
Platelets: 265 10*3/uL (ref 140–400)
RBC: 4.6 10*6/uL (ref 3.80–5.10)
RDW: 13.2 % (ref 11.0–15.0)
Total Lymphocyte: 24.6 %
WBC: 9.2 10*3/uL (ref 3.8–10.8)

## 2020-02-02 LAB — COMPLETE METABOLIC PANEL WITH GFR
AG Ratio: 2.2 (calc) (ref 1.0–2.5)
ALT: 19 U/L (ref 6–29)
AST: 14 U/L (ref 10–35)
Albumin: 4.7 g/dL (ref 3.6–5.1)
Alkaline phosphatase (APISO): 85 U/L (ref 37–153)
BUN: 22 mg/dL (ref 7–25)
CO2: 23 mmol/L (ref 20–32)
Calcium: 10.6 mg/dL — ABNORMAL HIGH (ref 8.6–10.4)
Chloride: 104 mmol/L (ref 98–110)
Creat: 0.57 mg/dL (ref 0.50–0.99)
GFR, Est African American: 114 mL/min/{1.73_m2} (ref >=60)
GFR, Est Non African American: 98 mL/min/{1.73_m2} (ref >=60)
Globulin: 2.1 g/dL (calc) (ref 1.9–3.7)
Glucose, Bld: 105 mg/dL — ABNORMAL HIGH (ref 65–99)
Potassium: 4.3 mmol/L (ref 3.5–5.3)
Sodium: 141 mmol/L (ref 135–146)
Total Bilirubin: 0.5 mg/dL (ref 0.2–1.2)
Total Protein: 6.8 g/dL (ref 6.1–8.1)

## 2020-02-02 LAB — HEMOGLOBIN A1C
Hgb A1c MFr Bld: 7.1 % of total Hgb — ABNORMAL HIGH (ref <5.7)
Mean Plasma Glucose: 157 mg/dL
eAG (mmol/L): 8.7 mmol/L

## 2020-02-02 LAB — LIPID PANEL
Cholesterol: 147 mg/dL (ref <200)
HDL: 46 mg/dL — ABNORMAL LOW (ref >=50)
LDL Cholesterol (Calc): 73 mg/dL (calc)
Non-HDL Cholesterol (Calc): 101 mg/dL (calc) (ref <130)
Total CHOL/HDL Ratio: 3.2 (calc) (ref <5.0)
Triglycerides: 183 mg/dL — ABNORMAL HIGH (ref <150)

## 2020-02-02 LAB — TSH: TSH: 0.35 mIU/L — ABNORMAL LOW (ref 0.40–4.50)

## 2020-02-02 LAB — VITAMIN D 25 HYDROXY (VIT D DEFICIENCY, FRACTURES): Vit D, 25-Hydroxy: 70 ng/mL (ref 30–100)

## 2020-02-02 LAB — URIC ACID: Uric Acid, Serum: 5.4 mg/dL (ref 2.5–7.0)

## 2020-03-06 ENCOUNTER — Other Ambulatory Visit: Payer: Self-pay | Admitting: Internal Medicine

## 2020-03-06 DIAGNOSIS — G589 Mononeuropathy, unspecified: Secondary | ICD-10-CM

## 2020-03-07 ENCOUNTER — Other Ambulatory Visit: Payer: Self-pay | Admitting: *Deleted

## 2020-03-07 MED ORDER — TRIJARDY XR 25-5-1000 MG PO TB24: ORAL_TABLET | ORAL | 1 refills | Status: DC

## 2020-03-14 LAB — HM MAMMOGRAPHY

## 2020-03-27 ENCOUNTER — Encounter: Payer: Self-pay | Admitting: Internal Medicine

## 2020-03-28 ENCOUNTER — Other Ambulatory Visit: Payer: Self-pay | Admitting: *Deleted

## 2020-03-28 MED ORDER — LOSARTAN POTASSIUM 50 MG PO TABS: 50.0000 mg | ORAL_TABLET | Freq: Every day | ORAL | 1 refills | Status: DC

## 2020-04-23 ENCOUNTER — Encounter: Payer: 59 | Admitting: Adult Health Nurse Practitioner

## 2020-04-24 ENCOUNTER — Encounter: Payer: Self-pay | Admitting: Adult Health

## 2020-04-24 NOTE — Progress Notes (Addendum)
Complete Physical  Assessment and Plan:  Encounter for general adult medical examination with abnormal findings Due 1 year;   Essential hypertension - continue medications, DASH diet, exercise and monitor at home. Call if greater than 130/80.  -     CBC with Differential/Platelet -     COMPLETE METABOLIC PANEL WITH GFR -     TSH -     Urinalysis, Routine w reflex microscopic -     Microalbumin / creatinine urine ratio -     EKG 12-Lead  Hyperlipidemia associated with type 2 diabetes mellitus (HCC) -     Lipid panel check lipids, LDL goal <70 decrease fatty foods increase activity.  Declines meds; essentially at goal; continue to monitor closely; working on lifestyle  CKD stage 2 due to type 2 diabetes mellitus (HCC) Increase fluids, avoid NSAIDS, monitor sugars, will monitor -     COMPLETE METABOLIC PANEL WITH GFR -     UA/microalbumin  Type 2 diabetes mellitus with diabetic mononeuropathy, without long-term current use of insulin (HCC) -     Hemoglobin A1c Discussed general issues about diabetes pathophysiology and management., Educational material distributed., Suggested low cholesterol diet., Encouraged aerobic exercise., Discussed foot care., Reminded to get yearly retinal exam. Changing insurance; cost concern with medications - she will verify coverage with insurance; given trulicity and synjardy samples in the interim;  Foot exam completed  Fatty infiltration of liver -     COMPLETE METABOLIC PANEL WITH GFR Check labs, avoid tylenol, alcohol, weight loss advised.   Overweight  - follow up 3 months for progress monitoring - increase veggies, decrease carbs - long discussion about weight loss, diet, and exercise  Obstructive sleep apnea on CPAP Continue CPAP   Polyp of colon, unspecified part of colon, unspecified type Declines until she is on medicare due to high deductible  Chronic gout without tophus, unspecified cause, unspecified site Gout- recheck Uric  acid as needed, Diet discussed, continue medications.  Vitamin D deficiency -     VITAMIN D 25 Hydroxy (Vit-D Deficiency, Fractures)  Stress incontinence in female Improved with PT  Medication management -     Magnesium  Type 2 diabetes mellitus with diabetic mononeuropathy, without long-term current use of insulin (HCC) -     Hemoglobin A1c - CONTINUE MEDICATIONS AND FOLLOW UP WITH ORTHO  Multinodular thyroid - follow up US order in system and pending, planning to get once on medicare due to high deductible   Orders Placed This Encounter  Procedures  . CBC with Differential/Platelet  . COMPLETE METABOLIC PANEL WITH GFR  . Magnesium  . Lipid panel  . TSH  . Hemoglobin A1c  . VITAMIN D 25 Hydroxy (Vit-D Deficiency, Fractures)  . Microalbumin / creatinine urine ratio  . Uric acid  . Urinalysis w microscopic + reflex cultur  . EKG 12-Lead  . HM DIABETES FOOT EXAM    Discussed med's effects and SE's. Screening labs and tests as requested with regular follow-up as recommended. Over 40 minutes of exam, counseling, chart review, and complex, high level critical decision making was performed this visit.  Future Appointments  Date Time Provider [place]  08/15/2020  9:30 AM Liane Comber, NP [facility] None  04/25/2021  9:00 AM Liane Comber, NP [facility] None    HPI  65 y.o. female  presents for a complete physical. She has Hyperlipidemia associated with type 2 diabetes mellitus ([place]); CKD stage 2 due to type 2 diabetes mellitus ([place]); History of colon polyps; Essential hypertension;  Gout; Obstructive sleep apnea on CPAP; Vitamin D deficiency; Secondary adhesive capsulitis of right shoulder; Stress incontinence in female; Overweight (BMI 25.0-29.9); Diabetes mellitus with diabetic mononeuropathy ([place]); Fatty infiltration of liver; and Multinodular goiter on their problem list.  She is retired Sales promotion account executive, married with 2 children and 4 grandchildren. She has a 8700  deductible until medicare in June.   OSA on CPAP, reports 100% compliance and restorative sleep.   She has had chest pain intermittent x years, had negative cardiovascular work up. Following with Dr. Ronnald Ramp at [facility], underwent anterior C5-C6 discectomy and fusion and has done well since, reports off of lyrica and radicular chest pain improved.   Follows with Dr. Archie Endo office for L shoulder, rotator cuff tendonitis and adhesive capsulitis, getting steroid injections with some improvement, taking celebrex.   She had thyroid nodules on MRI, had follow up US on thyroid 10/2018 and only 1 nodule meet criteria for 1 year follow up, none met criteria for biospy. Follow up US order has been placed and pending once on medicare.   BMI is Body mass index is 29.3 kg/m., she is working on diet and exercise. She is off sodas, reports has been traveling a lot and eating more. Lots of water. 1 cup of coffee per day. Does have a treadmill, willing to start walking.  Admits to poor fruit/veggie intake. Tries to reduce bread.  Wt Readings from Last 3 Encounters:  04/25/20 165 lb 6.4 oz (75 kg)  02/01/20 160 lb (72.6 kg)  11/01/19 174 lb 6.4 oz (79.1 kg)   Her blood pressure has been controlled at home, today their BP is BP: 110/72 She does not workout. She denies chest pain, shortness of breath, dizziness.   She is not on cholesterol medication and denies myalgias. Her cholesterol close to goal of 70, her dad has heart disease and died age 56.  The cholesterol last visit was:   Lab Results  Component Value Date   CHOL 147 02/01/2020   HDL 46 (L) 02/01/2020   LDLCALC 73 02/01/2020   TRIG 183 (H) 02/01/2020   CHOLHDL 3.2 02/01/2020   She has been working on diet and exercise for diabetes. Has had DM 20+ years. Has been on TRIJARDY - and 1000 mg metformin in the evening, but will be getting on medicare and extended discussion about cost Trulicity 3.0 mg weekly  amaryl 4 mg,  taking if it is over 150 (1 tab) or 200 (2 tabs) - has had to take due to recent steroid injections  She does have CKD on ARB  She has chest neuropathy- and denies foot ulcerations, increased appetite, nausea, paresthesia of the feet, visual disturbances, vomiting and weight loss.   Fasting ranging 112-180s, average 130s - higher since recent steroid shot denies paresthesia of the feet, polydipsia, polyuria and visual disturbances.  Last A1C in the office was:  Lab Results  Component Value Date   HGBA1C 7.1 (H) 02/01/2020   Last GFR: Lab Results  Component Value Date   GFRNONAA 98 02/01/2020   Patient is on Vitamin D supplement.   Lab Results  Component Value Date   VD25OH 70 02/01/2020        Current Medications:   Current Outpatient Medications (Endocrine & Metabolic):  Marland Kitchen  Dulaglutide (TRULICITY) 3 PN/3.6RW SOPN, Inject 3 mg as directed once a week. .  Empagliflozin-Linaglip-Metform (TRIJARDY XR) 25-06-998 MG TB24, 1 PILL A DAY FOR DIABETES (Patient taking differently: 12.5 mg 2 (two)  times daily. 1 PILL A DAY FOR DIABETES) .  glimepiride (AMARYL) 4 MG tablet, Take 2 tablets Daily with Meals for Diabetes (Patient taking differently: as needed. Take 2 tablets Daily with Meals for Diabetes) .  metFORMIN (GLUCOPHAGE-XR) 500 MG 24 hr tablet, Take  1 tablet  2 x /day  with Meals  for Diabetes (Patient not taking: Reported on 04/25/2020)  Current Outpatient Medications (Cardiovascular):  .  losartan (COZAAR) 50 MG tablet, Take 1 tablet (50 mg total) by mouth daily.   Current Outpatient Medications (Analgesics):  .  allopurinol (ZYLOPRIM) 100 MG tablet, Take      1 tablet      Daily     to Prevent Gout .  aspirin 81 MG tablet, Take 81 mg by mouth daily. .  celecoxib (CELEBREX) 200 MG capsule, Take  1 capsule  2 x /day  with Meals  for Pain & Inflammation   Current Outpatient Medications (Other):  .  Blood Glucose Monitoring Suppl DEVI, Test blood sugar once daily for diabetes .   Cholecalciferol (VITAMIN D) 2000 UNITS tablet, Take 2,000 Units by mouth 2 (two) times daily. Marland Kitchen  glucose blood (FREESTYLE LITE) test strip, Test blood sugar once daily for diabetes .  Lancets MISC, Test blood sugar once daily for diabetes .  Multiple Vitamin (MULTIVITAMIN) capsule, Take 1 capsule by mouth daily. .  Omega-3 Fatty Acids (FISH OIL) 1000 MG CAPS, Take by mouth daily. .  ondansetron (ZOFRAN-ODT) 8 MG disintegrating tablet, Take 1 tablet (8 mg total) by mouth every 8 (eight) hours as needed for nausea or vomiting. .  phentermine (ADIPEX-P) 37.5 MG tablet, Take 1 tablet (37.5 mg total) by mouth daily before breakfast. (Patient not taking: No sig reported)  Health Maintenance:   Immunization History  Administered Date(s) Administered  . PPD Test 02/22/2013, 02/23/2014  . Td 02/09/2001  . Tdap 02/23/2011  . Zoster 02/09/2006   Tetanus: 2013 Pneumovax: declines Prevnar 13: declines Flu vaccine: declines Zostavax: 2008 - ask insurance about shingrix  Covid 19: declines  Pap: 2018 neg/HPV, TAH, declines further  MGM: 03/14/2020 normal at [place]: 2010 normal, repeat age 89  Colonoscopy: 2012 will do with medicare Stress test 2018 Echo/stress test 2018  US thyroid 10/2018 - 1.6 cm right superior thyroid nodule meets criteria for 1 year follow-up ultrasound - ordered and pending when on medicare   Last eye: 01/2020 Dr. Comer [place].  Last dental: 2021, has upcoming in June Last derm: last several years ago   Patient Care Team: Unk Pinto, MD as PCP - General (Internal Medicine) Calvert Cantor, MD as Consulting Physician (Ophthalmology) Richmond Campbell, MD as Consulting Physician (Gastroenterology) Brien Few, MD as Consulting Physician (Obstetrics and Gynecology) Daryll Brod, MD as Consulting Physician (Orthopedic Surgery) Lavonna Monarch, MD as Consulting Physician (Dermatology)  Medical History:  Past Medical History:  Diagnosis Date  . Anemia   . Colon  polyps   . Diabetes mellitus without complication ([place])   . Gout   . Hypertriglyceridemia   . Obstructive sleep apnea on CPAP    uses CPAP nightly  . 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 Clinoril [Sulindac]   . Erythromycin   . Shrimp [Shellfish Allergy]     SURGICAL HISTORY She  has a past surgical history that includes Abdominal hysterectomy; Breast surgery (Left); achilles (Right, 2009); Eye surgery (Left); Shoulder arthroscopy (Right, 2017); Foot surgery (Left, 06/16/2017); Anterior cervical decomp/discectomy fusion (01/10/2020); Achilles tendon  surgery (Left); and Salpingooopherectomy (Bilateral, 1999). FAMILY HISTORY Her family history includes Alcohol abuse in her father; COPD in her mother; Depression in her mother; Diabetes in her daughter and sister; Heart attack (age of onset: 13) in her father; Heart disease in her father. SOCIAL HISTORY She  reports that she has never smoked. She has never used smokeless tobacco. She reports that she does not drink alcohol and does not use drugs.  Review of Systems: Review of Systems  Constitutional: Negative for malaise/fatigue and weight loss.  HENT: Negative for hearing loss and tinnitus.   Eyes: Negative for blurred vision and double vision.  Respiratory: Negative for cough, shortness of breath and wheezing.   Cardiovascular: Negative for chest pain, palpitations, orthopnea, claudication and leg swelling.  Gastrointestinal: Negative for abdominal pain, blood in stool, constipation, diarrhea, heartburn, melena, nausea and vomiting.  Genitourinary: Negative.   Musculoskeletal: Negative for joint pain and myalgias.  Skin: Negative for rash.  Neurological: Negative for dizziness, tingling, sensory change, weakness and headaches.  Endo/Heme/Allergies: Negative for polydipsia.  Psychiatric/Behavioral: Negative.   All other systems reviewed and are negative.   Physical  Exam: Estimated body mass index is 29.3 kg/m as calculated from the following:   Height as of this encounter: 5' 3" (1.6 m).   Weight as of this encounter: 165 lb 6.4 oz (75 kg). BP 110/72   Pulse 87   Temp (!) 96.4 F (35.8 C)   Ht 5' 3" (1.6 m)   Wt 165 lb 6.4 oz (75 kg)   SpO2 97%   BMI 29.30 kg/m  General Appearance: Well nourished, in no apparent distress.  Eyes: PERRLA, EOMs, conjunctiva no swelling or erythema, normal fundi and vessels.  Sinuses: No Frontal/maxillary tenderness  ENT/Mouth: Ext aud canals clear, normal light reflex with TMs without erythema, bulging. Good dentition. No erythema, swelling, or exudate on post pharynx. Tonsils not swollen or erythematous. Hearing normal.  Neck: Supple, thyroid without distinct palpable nodule. No bruits  Respiratory: Respiratory effort normal, BS equal bilaterally without rales, rhonchi, wheezing or stridor.  Cardio: RRR without murmurs, rubs or gallops. Brisk peripheral pulses without edema.  Chest: symmetric, with normal excursions and percussion.  Breasts: Defer; just had normal mammogram Abdomen: Soft, nontender, no guarding, rebound, hernias, masses, or organomegaly.  Lymphatics: Non tender without lymphadenopathy.  Musculoskeletal: Full ROM all peripheral extremities,5/5 strength, and normal gait.  Skin: Warm, dry without rashes, lesions, ecchymosis. Neuro: Cranial nerves intact, reflexes equal bilaterally. Normal muscle tone, no cerebellar symptoms. Sensation intact.  Psych: Awake and oriented X 3, normal affect, Insight and Judgment appropriate.  GU: declines  EKG: NSR, IRBBB  Gorden Harms Corbett 9:15 AM [facility]

## 2020-04-25 ENCOUNTER — Encounter: Payer: Self-pay | Admitting: Adult Health

## 2020-04-25 ENCOUNTER — Ambulatory Visit (INDEPENDENT_AMBULATORY_CARE_PROVIDER_SITE_OTHER): Payer: 59 | Admitting: Adult Health

## 2020-04-25 ENCOUNTER — Other Ambulatory Visit: Payer: Self-pay

## 2020-04-25 VITALS — BP 110/72 | HR 87 | Temp 96.4°F | Ht 63.0 in | Wt 165.4 lb

## 2020-04-25 DIAGNOSIS — R8290 Unspecified abnormal findings in urine: Secondary | ICD-10-CM

## 2020-04-25 DIAGNOSIS — Z131 Encounter for screening for diabetes mellitus: Secondary | ICD-10-CM | POA: Diagnosis not present

## 2020-04-25 DIAGNOSIS — Z86010 Personal history of colonic polyps: Secondary | ICD-10-CM

## 2020-04-25 DIAGNOSIS — Z1389 Encounter for screening for other disorder: Secondary | ICD-10-CM

## 2020-04-25 DIAGNOSIS — Z1329 Encounter for screening for other suspected endocrine disorder: Secondary | ICD-10-CM

## 2020-04-25 DIAGNOSIS — Z0000 Encounter for general adult medical examination without abnormal findings: Secondary | ICD-10-CM | POA: Diagnosis not present

## 2020-04-25 DIAGNOSIS — Z136 Encounter for screening for cardiovascular disorders: Secondary | ICD-10-CM

## 2020-04-25 DIAGNOSIS — E663 Overweight: Secondary | ICD-10-CM

## 2020-04-25 DIAGNOSIS — E042 Nontoxic multinodular goiter: Secondary | ICD-10-CM

## 2020-04-25 DIAGNOSIS — F419 Anxiety disorder, unspecified: Secondary | ICD-10-CM

## 2020-04-25 DIAGNOSIS — E785 Hyperlipidemia, unspecified: Secondary | ICD-10-CM

## 2020-04-25 DIAGNOSIS — Z13220 Encounter for screening for lipoid disorders: Secondary | ICD-10-CM | POA: Diagnosis not present

## 2020-04-25 DIAGNOSIS — M1A9XX0 Chronic gout, unspecified, without tophus (tophi): Secondary | ICD-10-CM

## 2020-04-25 DIAGNOSIS — G4733 Obstructive sleep apnea (adult) (pediatric): Secondary | ICD-10-CM

## 2020-04-25 DIAGNOSIS — K760 Fatty (change of) liver, not elsewhere classified: Secondary | ICD-10-CM

## 2020-04-25 DIAGNOSIS — E1141 Type 2 diabetes mellitus with diabetic mononeuropathy: Secondary | ICD-10-CM

## 2020-04-25 DIAGNOSIS — E1169 Type 2 diabetes mellitus with other specified complication: Secondary | ICD-10-CM

## 2020-04-25 DIAGNOSIS — Z79899 Other long term (current) drug therapy: Secondary | ICD-10-CM

## 2020-04-25 DIAGNOSIS — E559 Vitamin D deficiency, unspecified: Secondary | ICD-10-CM

## 2020-04-25 DIAGNOSIS — N393 Stress incontinence (female) (male): Secondary | ICD-10-CM

## 2020-04-25 DIAGNOSIS — E1122 Type 2 diabetes mellitus with diabetic chronic kidney disease: Secondary | ICD-10-CM

## 2020-04-25 DIAGNOSIS — I10 Essential (primary) hypertension: Secondary | ICD-10-CM | POA: Diagnosis not present

## 2020-04-25 DIAGNOSIS — R300 Dysuria: Secondary | ICD-10-CM

## 2020-04-25 DIAGNOSIS — N182 Chronic kidney disease, stage 2 (mild): Secondary | ICD-10-CM

## 2020-04-25 MED ORDER — METFORMIN HCL ER 500 MG PO TB24: ORAL_TABLET | ORAL | 0 refills | Status: DC

## 2020-04-25 NOTE — Patient Instructions (Addendum)
°  Amanda Cooper , Thank you for taking time to come for your Medicare Wellness Visit. I appreciate your ongoing commitment to your health goals. Please review the following plan we discussed and let me know if I can assist you in the future.   These are the goals we discussed: Goals     DIET - EAT MORE FRUITS AND VEGETABLES     Greens, berries, whole grains, beans, nuts/seeds     Exercise 150 min/wk Moderate Activity     HEMOGLOBIN A1C < 7     Weight (lb) < 145 lb (65.8 kg)       This is a list of the screening recommended for you and due dates:  Health Maintenance  Topic Date Due   Eye exam for diabetics  04/25/2020   COVID-19 Vaccine (1) 05/11/2020*   Flu Shot  06/11/2020*   Hemoglobin A1C  08/01/2020   Colon Cancer Screening  02/09/2021   Tetanus Vaccine  02/22/2021   Mammogram  03/14/2021   Complete foot exam   04/25/2021    Hepatitis C: One time screening is recommended by Center for Disease Control  (CDC) for  adults born from [birth year] through 1965.   Completed   HIV Screening  Completed   HPV Vaccine  Aged Out   Pap Smear  Discontinued  *Topic was postponed. The date shown is not the original due date.     Check with insurance about shingrix coverage - can get at [facility] or [facility]  Consider D- mannose supplement, [phone] mg once or twice daily works similarly to [employer] (antibiotic) for preventing or treating UTI while on vacation    A great goal to work towards is aiming to get in a serving daily of some of the most nutritionally dense foods - G- BOMBS daily    Aim for low saturated fat  High fiber - particularly soluble fiber -  Beans, whole grains, seeds, nuts, low starch veggies         Know what a healthy weight is for you (roughly BMI <25) and aim to maintain this  Aim for 7+ servings of fruits and vegetables daily  65-80+ fluid ounces of water or unsweet tea for healthy kidneys  Limit to max 1 drink of alcohol per day; avoid  smoking/tobacco  Limit animal fats in diet for cholesterol and heart health - choose grass fed whenever available  Avoid highly processed foods, and foods high in saturated/trans fats  Aim for low stress - take time to unwind and care for your mental health  Aim for 150 min of moderate intensity exercise weekly for heart health, and weights twice weekly for bone health  Aim for 7-9 hours of sleep daily

## 2020-04-26 ENCOUNTER — Other Ambulatory Visit: Payer: Self-pay | Admitting: Adult Health

## 2020-04-26 DIAGNOSIS — N390 Urinary tract infection, site not specified: Secondary | ICD-10-CM

## 2020-04-26 MED ORDER — SULFAMETHOXAZOLE-TRIMETHOPRIM 800-160 MG PO TABS: 1.0000 | ORAL_TABLET | Freq: Two times a day (BID) | ORAL | 0 refills | Status: DC

## 2020-04-28 LAB — CBC WITH DIFFERENTIAL/PLATELET
Absolute Monocytes: 283 cells/uL (ref 200–950)
Basophils Absolute: 29 cells/uL (ref 0–200)
Basophils Relative: 0.7 %
Eosinophils Absolute: 123 cells/uL (ref 15–500)
Eosinophils Relative: 3 %
HCT: 41 % (ref 35.0–45.0)
Hemoglobin: 13.5 g/dL (ref 11.7–15.5)
Lymphs Abs: 1255 cells/uL (ref 850–3900)
MCH: 32 pg (ref 27.0–33.0)
MCHC: 32.9 g/dL (ref 32.0–36.0)
MCV: 97.2 fL (ref 80.0–100.0)
MPV: 10.7 fL (ref 7.5–12.5)
Monocytes Relative: 6.9 %
Neutro Abs: 2411 cells/uL (ref 1500–7800)
Neutrophils Relative %: 58.8 %
Platelets: 164 10*3/uL (ref 140–400)
RBC: 4.22 10*6/uL (ref 3.80–5.10)
RDW: 13.1 % (ref 11.0–15.0)
Total Lymphocyte: 30.6 %
WBC: 4.1 10*3/uL (ref 3.8–10.8)

## 2020-04-28 LAB — URINE CULTURE
MICRO NUMBER:: 11661752
SPECIMEN QUALITY:: ADEQUATE

## 2020-04-28 LAB — COMPLETE METABOLIC PANEL WITH GFR
AG Ratio: 2.1 (calc) (ref 1.0–2.5)
ALT: 18 U/L (ref 6–29)
AST: 13 U/L (ref 10–35)
Albumin: 4.1 g/dL (ref 3.6–5.1)
Alkaline phosphatase (APISO): 71 U/L (ref 37–153)
BUN: 12 mg/dL (ref 7–25)
CO2: 29 mmol/L (ref 20–32)
Calcium: 9.6 mg/dL (ref 8.6–10.4)
Chloride: 104 mmol/L (ref 98–110)
Creat: 0.54 mg/dL (ref 0.50–0.99)
GFR, Est African American: 116 mL/min/{1.73_m2} (ref >=60)
GFR, Est Non African American: 100 mL/min/{1.73_m2} (ref >=60)
Globulin: 2 g/dL (calc) (ref 1.9–3.7)
Glucose, Bld: 137 mg/dL — ABNORMAL HIGH (ref 65–99)
Potassium: 4.2 mmol/L (ref 3.5–5.3)
Sodium: 141 mmol/L (ref 135–146)
Total Bilirubin: 0.5 mg/dL (ref 0.2–1.2)
Total Protein: 6.1 g/dL (ref 6.1–8.1)

## 2020-04-28 LAB — URINALYSIS W MICROSCOPIC + REFLEX CULTURE
Bilirubin Urine: NEGATIVE
Hyaline Cast: NONE SEEN /LPF
Nitrites, Initial: POSITIVE — AB
Protein, ur: NEGATIVE
Specific Gravity, Urine: 1.036 — ABNORMAL HIGH (ref 1.001–1.03)
Squamous Epithelial / HPF: NONE SEEN /HPF (ref <=5)
WBC, UA: >=60 /HPF — AB (ref 0–5)
pH: <=5 (ref 5.0–8.0)

## 2020-04-28 LAB — URIC ACID: Uric Acid, Serum: 5.3 mg/dL (ref 2.5–7.0)

## 2020-04-28 LAB — HEMOGLOBIN A1C
Hgb A1c MFr Bld: 7.3 % of total Hgb — ABNORMAL HIGH (ref <5.7)
Mean Plasma Glucose: 163 mg/dL
eAG (mmol/L): 9 mmol/L

## 2020-04-28 LAB — MICROALBUMIN / CREATININE URINE RATIO
Creatinine, Urine: 52 mg/dL (ref 20–275)
Microalb Creat Ratio: 44 mcg/mg creat — ABNORMAL HIGH (ref <30)
Microalb, Ur: 2.3 mg/dL

## 2020-04-28 LAB — LIPID PANEL
Cholesterol: 165 mg/dL (ref <200)
HDL: 44 mg/dL — ABNORMAL LOW (ref >=50)
LDL Cholesterol (Calc): 90 mg/dL (calc)
Non-HDL Cholesterol (Calc): 121 mg/dL (calc) (ref <130)
Total CHOL/HDL Ratio: 3.8 (calc) (ref <5.0)
Triglycerides: 213 mg/dL — ABNORMAL HIGH (ref <150)

## 2020-04-28 LAB — MAGNESIUM: Magnesium: 1.9 mg/dL (ref 1.5–2.5)

## 2020-04-28 LAB — CULTURE INDICATED

## 2020-04-28 LAB — VITAMIN D 25 HYDROXY (VIT D DEFICIENCY, FRACTURES): Vit D, 25-Hydroxy: 75 ng/mL (ref 30–100)

## 2020-04-28 LAB — TSH: TSH: 0.74 mIU/L (ref 0.40–4.50)

## 2020-05-08 ENCOUNTER — Other Ambulatory Visit: Payer: Self-pay | Admitting: Internal Medicine

## 2020-05-08 DIAGNOSIS — M1A9XX0 Chronic gout, unspecified, without tophus (tophi): Secondary | ICD-10-CM

## 2020-06-03 ENCOUNTER — Other Ambulatory Visit: Payer: Self-pay | Admitting: Adult Health

## 2020-07-23 ENCOUNTER — Ambulatory Visit
Admission: RE | Admit: 2020-07-23 | Discharge: 2020-07-23 | Disposition: A | Discharge status: Home / Self Care | Payer: Medicare Other | Source: Ambulatory Visit | Attending: Adult Health | Admitting: Adult Health

## 2020-07-23 ENCOUNTER — Other Ambulatory Visit: Payer: Self-pay

## 2020-07-23 ENCOUNTER — Telehealth: Payer: Self-pay

## 2020-07-23 ENCOUNTER — Other Ambulatory Visit: Payer: Self-pay | Admitting: Adult Health

## 2020-07-23 DIAGNOSIS — E042 Nontoxic multinodular goiter: Secondary | ICD-10-CM | POA: Diagnosis not present

## 2020-07-23 DIAGNOSIS — E1141 Type 2 diabetes mellitus with diabetic mononeuropathy: Secondary | ICD-10-CM

## 2020-07-23 MED ORDER — EMPAGLIFLOZIN 25 MG PO TABS: 25.0000 mg | ORAL_TABLET | Freq: Every day | ORAL | 1 refills | Status: DC

## 2020-07-23 MED ORDER — METFORMIN HCL ER 500 MG PO TB24: ORAL_TABLET | ORAL | 1 refills | Status: DC

## 2020-07-23 MED ORDER — METFORMIN HCL ER 500 MG PO TB24
ORAL_TABLET | ORAL | 1 refills | Status: AC
Start: 2020-07-23 — End: ?

## 2020-07-23 NOTE — Telephone Encounter (Signed)
Patient is requesting a prescription for Jardiance, since [place] gave her samples.

## 2020-07-24 ENCOUNTER — Other Ambulatory Visit: Payer: Self-pay | Admitting: Adult Health

## 2020-08-01 DIAGNOSIS — M5412 Radiculopathy, cervical region: Secondary | ICD-10-CM | POA: Diagnosis not present

## 2020-08-05 DIAGNOSIS — M25612 Stiffness of left shoulder, not elsewhere classified: Secondary | ICD-10-CM | POA: Diagnosis not present

## 2020-08-05 DIAGNOSIS — M2580 Other specified joint disorders, unspecified joint: Secondary | ICD-10-CM | POA: Diagnosis not present

## 2020-08-05 DIAGNOSIS — M25512 Pain in left shoulder: Secondary | ICD-10-CM | POA: Diagnosis not present

## 2020-08-05 DIAGNOSIS — R531 Weakness: Secondary | ICD-10-CM | POA: Diagnosis not present

## 2020-08-06 ENCOUNTER — Other Ambulatory Visit: Payer: Self-pay | Admitting: Student

## 2020-08-06 DIAGNOSIS — M5412 Radiculopathy, cervical region: Secondary | ICD-10-CM

## 2020-08-06 DIAGNOSIS — H40013 Open angle with borderline findings, low risk, bilateral: Secondary | ICD-10-CM | POA: Diagnosis not present

## 2020-08-09 DIAGNOSIS — M2580 Other specified joint disorders, unspecified joint: Secondary | ICD-10-CM | POA: Diagnosis not present

## 2020-08-09 DIAGNOSIS — M25612 Stiffness of left shoulder, not elsewhere classified: Secondary | ICD-10-CM | POA: Diagnosis not present

## 2020-08-09 DIAGNOSIS — M25512 Pain in left shoulder: Secondary | ICD-10-CM | POA: Diagnosis not present

## 2020-08-09 DIAGNOSIS — R531 Weakness: Secondary | ICD-10-CM | POA: Diagnosis not present

## 2020-08-14 ENCOUNTER — Ambulatory Visit
Admission: RE | Admit: 2020-08-14 | Discharge: 2020-08-14 | Disposition: A | Discharge status: Home / Self Care | Payer: Medicare Other | Source: Ambulatory Visit | Attending: Student | Admitting: Student

## 2020-08-14 DIAGNOSIS — M50121 Cervical disc disorder at C4-C5 level with radiculopathy: Secondary | ICD-10-CM | POA: Diagnosis not present

## 2020-08-14 DIAGNOSIS — M4722 Other spondylosis with radiculopathy, cervical region: Secondary | ICD-10-CM | POA: Diagnosis not present

## 2020-08-14 DIAGNOSIS — M25512 Pain in left shoulder: Secondary | ICD-10-CM | POA: Diagnosis not present

## 2020-08-14 DIAGNOSIS — M4312 Spondylolisthesis, cervical region: Secondary | ICD-10-CM | POA: Diagnosis not present

## 2020-08-14 DIAGNOSIS — M5412 Radiculopathy, cervical region: Secondary | ICD-10-CM

## 2020-08-14 DIAGNOSIS — M25612 Stiffness of left shoulder, not elsewhere classified: Secondary | ICD-10-CM | POA: Diagnosis not present

## 2020-08-14 DIAGNOSIS — M2580 Other specified joint disorders, unspecified joint: Secondary | ICD-10-CM | POA: Diagnosis not present

## 2020-08-14 DIAGNOSIS — R531 Weakness: Secondary | ICD-10-CM | POA: Diagnosis not present

## 2020-08-14 DIAGNOSIS — M2578 Osteophyte, vertebrae: Secondary | ICD-10-CM | POA: Diagnosis not present

## 2020-08-14 NOTE — Progress Notes (Signed)
MEDICARE ANNUAL WELLNESS VISIT AND FOLLOW UP  Assessment:   Annual Medicare Wellness Visit Due annually  Health maintenance reviewed - check with insurance about shingrix  Essential hypertension - continue medications, DASH diet, exercise and monitor at home. Call if greater than 130/80.   Hyperlipidemia associated with type 2 diabetes mellitus (HCC) check lipids, LDL goal <70 She is declining addition of medication at this time, working aggressively on lifestyle decrease saturated fat, increase soluble fiber increase activity.   CKD stage 2 due to type 2 diabetes mellitus (HCC) Increase fluids, avoid NSAIDS, monitor sugars, will monitor  Type 2 diabetes mellitus with diabetic mononeuropathy, without long-term current use of insulin ([place]) Discussed general issues about diabetes pathophysiology and management., Educational material distributed., Suggested low cholesterol diet., Encouraged aerobic exercise., Discussed foot care., Reminded to get yearly retinal exam. Changing insurance; cost concern with medications - off of jardiance She is on trulicity, metformin - increase glimepiride to BID, can do 1/2-1 tab, reduce if improving with lifestyle mods, risk of hypoglycemia reviewed Check A1C q67m  Fatty infiltration of liver Check LFTs, avoid tylenol, alcohol, processed carbohydrate, weight loss advised.   Obesity - BMI 30 - follow up 3 months for progress monitoring - increase veggies, decrease carbs - long discussion about weight loss, diet, and exercise - Patient on phentermine with benefit and no SE, taking drug breaks; continue close follow up. Return in 3 months  Obstructive sleep apnea on CPAP Continue CPAP, weight loss encouraged  Polyp of colon, unspecified part of colon, unspecified type Referral placed to Dr. Earlean Shawl  Chronic gout without tophus, unspecified cause, unspecified site - recheck Uric acid as needed, Diet discussed, continue medications.  Vitamin D  deficiency Continue supplement for goal 60-100 Check levels annually and as needed.   Stress incontinence in female Improved with PT  Medication management -     Magnesium  Multinodular thyroid -  [place] 07/2020 showed stable nodules, no futher follow up recommended unless new concerns - monitor TSH    Over 40 minutes of exam, counseling, chart review and critical decision making was performed Future Appointments  Date Time Provider [place]  04/25/2021  9:00 AM Liane Comber, NP [facility] None  08/15/2021  9:30 AM Liane Comber, NP [facility] None     Plan:   During the course of the visit the patient was educated and counseled about appropriate screening and preventive services including:   Pneumococcal vaccine  Prevnar 13 Influenza vaccine Td vaccine Screening electrocardiogram Bone densitometry screening Colorectal cancer screening Diabetes screening Glaucoma screening Nutrition counseling  Advanced directives: requested   Subjective:  Amanda Cooper is a 65 y.o. female who presents for Medicare Annual Wellness Visit and 3 month follow up.   She is retired Sales promotion account executive, married with 2 children and 4 grandchildren.   OSA on CPAP, reports 100% compliance and restorative sleep.   She has had chest pain intermittent x years, had negative cardiovascular work up. Following with Dr. Ronnald Ramp at [facility], underwent anterior C5-C6 discectomy and fusion 01/10/2020, radicular chest pain has improved but still having radicular left shoulder pain. Follows with Dr. Noemi Chapel as well, infections didn't help. Doing PT, dry needling. Taking celebrex.   She had thyroid nodules on MRI, had follow up US on thyroid 07/2020 that found stable/smaller nodules, no further follow up was recommended.   BMI is Body mass index is 30.82 kg/m., she is working on diet and exercise. She is off sodas, reports has been  traveling a lot and eating more. Lots of water. 1  cup of coffee per day. Joined silver sneakers, going 4 days a week, weights.  Admits to poor fruit/veggie intake but trying to increase. Tries to reduce bread.  Wt Readings from Last 3 Encounters:  08/15/20 174 lb (78.9 kg)  04/25/20 165 lb 6.4 oz (75 kg)  02/01/20 160 lb (72.6 kg)   Her blood pressure has been controlled at home, today their BP is BP: 128/76 She does workout. She denies chest pain, shortness of breath, dizziness.   She is not on cholesterol medication and denies myalgias. Her cholesterol is not at goal <70, but working aggressively on lifesytle, patient preference to avoid med at this time. The cholesterol last visit was:   Lab Results  Component Value Date   CHOL 165 04/25/2020   HDL 44 (L) 04/25/2020   LDLCALC 90 04/25/2020   TRIG 213 (H) 04/25/2020   CHOLHDL 3.8 04/25/2020   She has had diabetes since 1997. She has been working on diet and exercise for T2 diabetes, and denies hyperglycemia, hypoglycemia , nausea, paresthesia of the feet, polydipsia, polyuria, and visual disturbances.  She is on truliciy 3 mg/week, metformin 1000 mg BID, off of jardiance due to cost with medication, has glimepiride 4 mg PRN, has been taking daily due to running higher Fasting glucose - recently near 200 after traveling  Last A1C in the office was:  Lab Results  Component Value Date   HGBA1C 7.3 (H) 04/25/2020   Last GFR: Lab Results  Component Value Date   GFRNONAA 100 04/25/2020   Patient is on Vitamin D supplement.   Lab Results  Component Value Date   VD25OH 75 04/25/2020      Medication Review: Current Outpatient Medications on File Prior to Visit  Medication Sig Dispense Refill   allopurinol (ZYLOPRIM) 100 MG tablet Take  1 tablet  Daily  to Prevent Gout 90 tablet 1   aspirin 81 MG tablet Take 81 mg by mouth daily.     Blood Glucose Monitoring Suppl DEVI Test blood sugar once daily for diabetes 200 each 0   celecoxib (CELEBREX) 200 MG capsule Take  1 capsule  2 x  /day  with Meals  for Pain & Inflammation 180 capsule 0   Cholecalciferol (VITAMIN D) 2000 UNITS tablet Take 2,000 Units by mouth 2 (two) times daily.     Dulaglutide (TRULICITY) 3 FG/1.8EX SOPN Inject 3 mg as directed once a week. 6 mL 1   glucose blood (FREESTYLE LITE) test strip Test blood sugar once daily for diabetes 100 each 12   Lancets MISC Test blood sugar once daily for diabetes 200 each 11   losartan (COZAAR) 50 MG tablet Take 1 tablet (50 mg total) by mouth daily. 90 tablet 1   metFORMIN (GLUCOPHAGE-XR) 500 MG 24 hr tablet Take 2 tablet  2 x /day  with Meals  for Diabetes 360 tablet 1   Omega-3 Fatty Acids (FISH OIL) 1000 MG CAPS Take by mouth daily.     ondansetron (ZOFRAN-ODT) 8 MG disintegrating tablet Take 1 tablet (8 mg total) by mouth every 8 (eight) hours as needed for nausea or vomiting. 30 tablet 0   Multiple Vitamin (MULTIVITAMIN) capsule Take 1 capsule by mouth daily. (Patient not taking: Reported on 08/15/2020)     phentermine (ADIPEX-P) 37.5 MG tablet Take 1 tablet (37.5 mg total) by mouth daily before breakfast. (Patient not taking: Reported on 08/15/2020) 30 tablet 2   No  current facility-administered medications on file prior to visit.    Allergies  Allergen Reactions   Clinoril [Sulindac]    Erythromycin    Shrimp [Shellfish Allergy]     Current Problems (verified) Patient Active Problem List   Diagnosis Date Noted   Multinodular goiter 10/20/2018   Fatty infiltration of liver 01/24/2018   Diabetes mellitus with diabetic mononeuropathy ([place]) 06/04/2017   Overweight (BMI 25.0-29.9) 10/02/2016   Stress incontinence in female 02/28/2016   Secondary adhesive capsulitis of right shoulder 10/10/2015   Vitamin D deficiency 11/15/2014   Gout    Obstructive sleep apnea on CPAP    Essential hypertension 01/03/2014   History of colon polyps    Hyperlipidemia associated with type 2 diabetes mellitus ([place])    CKD stage 2 due to type 2 diabetes mellitus ([place])      Screening Tests Immunization History  Administered Date(s) Administered   PPD Test 02/22/2013, 02/23/2014   Td 02/09/2001   Tdap 02/23/2011   Zoster, Live 02/09/2006   Tetanus: 2013 Pneumovax: declines Prevnar 13: declines Flu vaccine: declines Zostavax: 2008 - ask insurance about shingrix  Covid 19: declines  Pap: 2018 neg/HPV, TAH, declines further  MGM: 03/14/2020 normal at [facility]: 2010 normal, repeat next year with mammogram   Colonoscopy: 2012 will refer, overdue, hx of polyps, Dr. Earlean Shawl Stress test 2018 Echo/stress test 2018  US thyroid 07/23/2020 - no changes/smaller nodules, no further follow up recommended   Names of Other Physician/Practitioners you currently use: 1. [facility] here for primary care 2. Dr. Comer Locket, eye doctor, last visit 01/2020 3. [facility], dentist, last visit 07/2020, looking for new 4. Dr. Denna Haggard, derm, last visit several years ago  Patient Care Team: Unk Pinto, MD as PCP - General (Internal Medicine) Calvert Cantor, MD as Consulting Physician (Ophthalmology) Richmond Campbell, MD as Consulting Physician (Gastroenterology) Brien Few, MD as Consulting Physician (Obstetrics and Gynecology) Daryll Brod, MD as Consulting Physician (Orthopedic Surgery) Lavonna Monarch, MD as Consulting Physician (Dermatology)  SURGICAL HISTORY She  has a past surgical history that includes Abdominal hysterectomy; Breast surgery (Left); achilles (Right, 2009); Eye surgery (Left); Shoulder arthroscopy (Right, 2017); Foot surgery (Left, 06/16/2017); Anterior cervical decomp/discectomy fusion (01/10/2020); Achilles tendon surgery (Left); and Salpingooopherectomy (Bilateral, 1999). FAMILY HISTORY Her family history includes Alcohol abuse in her father; COPD in her mother; Depression in her mother; Diabetes in her daughter and sister; Heart attack (age of onset: 48) in her father; Heart disease in her  father. SOCIAL HISTORY She  reports that she has never smoked. She has never used smokeless tobacco. She reports that she does not drink alcohol and does not use drugs.   MEDICARE WELLNESS OBJECTIVES: Physical activity: Current Exercise Habits: Home exercise routine, Type of exercise: strength training/weights, Time (Minutes): 60, Frequency (Times/Week): 4, Weekly Exercise (Minutes/Week): 240, Intensity: Mild, Exercise limited by: None identified Cardiac risk factors: Cardiac Risk Factors include: advanced age (>13men, >44 women);dyslipidemia;hypertension;diabetes mellitus Depression/mood screen:   Depression screen [facility] 2/9 04/25/2020  Decreased Interest 0  Down, Depressed, Hopeless 0  PHQ - 2 Score 0    ADLs:  In your present state of health, do you have any difficulty performing the following activities: 08/15/2020  Hearing? N  Vision? N  Difficulty concentrating or making decisions? N  Walking or climbing stairs? N  Dressing or bathing? N  Doing errands, shopping? N  Some recent data might be hidden     Cognitive Testing  Alert? Yes  Normal Appearance?Yes  Oriented to person? Yes  Place? Yes   Time? Yes  Recall of three objects?  Yes  Can perform simple calculations? Yes  Displays appropriate judgment?Yes  Can read the correct time from a watch face?Yes  EOL planning: Does Patient Have a Medical Advance Directive?: No, Yes Type of Advance Directive: [place] Does patient want to make changes to medical advance directive?: No - Patient declined Copy of [place] in Chart?: No - copy requested Would patient like information on creating a medical advance directive?: No - Patient declined  Review of Systems  Constitutional:  Negative for malaise/fatigue and weight loss.  HENT:  Negative for hearing loss and tinnitus.   Eyes:  Negative for blurred vision and double vision.  Respiratory:  Negative for cough, sputum production, shortness of  breath and wheezing.   Cardiovascular:  Negative for chest pain, palpitations, orthopnea, claudication, leg swelling and PND.  Gastrointestinal:  Negative for abdominal pain, blood in stool, constipation, diarrhea, heartburn, melena, nausea and vomiting.  Genitourinary: Negative.   Musculoskeletal:  Negative for falls, joint pain and myalgias.  Skin:  Negative for rash.  Neurological:  Negative for dizziness, tingling, sensory change, weakness and headaches.  Endo/Heme/Allergies:  Negative for polydipsia.  Psychiatric/Behavioral: Negative.  Negative for depression, memory loss, substance abuse and suicidal ideas. The patient is not nervous/anxious and does not have insomnia.   All other systems reviewed and are negative.   Objective:     Today's Vitals   08/15/20 0921  BP: 128/76  Pulse: 90  Temp: (!) 96.6 F (35.9 C)  SpO2: 97%  Weight: 174 lb (78.9 kg)   Body mass index is 30.82 kg/m.  General appearance: alert, no distress, WD/WN, female HEENT: normocephalic, sclerae anicteric, TMs pearly, nares patent, no discharge or erythema, pharynx normal Oral cavity: MMM, no lesions Neck: supple, no lymphadenopathy, no thyromegaly, no masses Heart: RRR, normal S1, S2, no murmurs Lungs: CTA bilaterally, no wheezes, rhonchi, or rales Abdomen: +bs, soft, non tender, non distended, no masses, no hepatomegaly, no splenomegaly Musculoskeletal: nontender, no swelling, no obvious deformity Extremities: no edema, no cyanosis, no clubbing Pulses: 2+ symmetric, upper and lower extremities, normal cap refill Neurological: alert, oriented x 3, CN2-12 intact, strength normal upper extremities and lower extremities, sensation normal throughout, DTRs 2+ throughout, no cerebellar signs, gait normal Psychiatric: normal affect, behavior normal, pleasant   AAA [place]: <3 cm   Medicare Attestation I have personally reviewed: The patient's medical and social history Their use of alcohol, tobacco or illicit  drugs Their current medications and supplements The patient's functional ability including ADLs,fall risks, home safety risks, cognitive, and hearing and visual impairment Diet and physical activities Evidence for depression or mood disorders  The patient's weight, height, BMI, and visual acuity have been recorded in the chart.  I have made referrals, counseling, and provided education to the patient based on review of the above and I have provided the patient with a written personalized care plan for preventive services.     Izora Ribas, NP   08/15/2020

## 2020-08-15 ENCOUNTER — Ambulatory Visit (INDEPENDENT_AMBULATORY_CARE_PROVIDER_SITE_OTHER): Payer: Medicare Other | Admitting: Adult Health

## 2020-08-15 ENCOUNTER — Encounter: Payer: Self-pay | Admitting: Adult Health

## 2020-08-15 ENCOUNTER — Other Ambulatory Visit: Payer: Self-pay

## 2020-08-15 VITALS — BP 128/76 | HR 90 | Temp 96.6°F | Wt 174.0 lb

## 2020-08-15 DIAGNOSIS — E1122 Type 2 diabetes mellitus with diabetic chronic kidney disease: Secondary | ICD-10-CM | POA: Diagnosis not present

## 2020-08-15 DIAGNOSIS — E559 Vitamin D deficiency, unspecified: Secondary | ICD-10-CM

## 2020-08-15 DIAGNOSIS — Z0001 Encounter for general adult medical examination with abnormal findings: Secondary | ICD-10-CM

## 2020-08-15 DIAGNOSIS — M1A9XX0 Chronic gout, unspecified, without tophus (tophi): Secondary | ICD-10-CM

## 2020-08-15 DIAGNOSIS — E1169 Type 2 diabetes mellitus with other specified complication: Secondary | ICD-10-CM | POA: Diagnosis not present

## 2020-08-15 DIAGNOSIS — Z9989 Dependence on other enabling machines and devices: Secondary | ICD-10-CM

## 2020-08-15 DIAGNOSIS — K760 Fatty (change of) liver, not elsewhere classified: Secondary | ICD-10-CM

## 2020-08-15 DIAGNOSIS — I10 Essential (primary) hypertension: Secondary | ICD-10-CM | POA: Diagnosis not present

## 2020-08-15 DIAGNOSIS — E1141 Type 2 diabetes mellitus with diabetic mononeuropathy: Secondary | ICD-10-CM

## 2020-08-15 DIAGNOSIS — Z0000 Encounter for general adult medical examination without abnormal findings: Secondary | ICD-10-CM

## 2020-08-15 DIAGNOSIS — R6889 Other general symptoms and signs: Secondary | ICD-10-CM | POA: Diagnosis not present

## 2020-08-15 DIAGNOSIS — Z86010 Personal history of colonic polyps: Secondary | ICD-10-CM

## 2020-08-15 DIAGNOSIS — N182 Chronic kidney disease, stage 2 (mild): Secondary | ICD-10-CM

## 2020-08-15 DIAGNOSIS — Z136 Encounter for screening for cardiovascular disorders: Secondary | ICD-10-CM

## 2020-08-15 DIAGNOSIS — E669 Obesity, unspecified: Secondary | ICD-10-CM

## 2020-08-15 DIAGNOSIS — E119 Type 2 diabetes mellitus without complications: Secondary | ICD-10-CM

## 2020-08-15 DIAGNOSIS — N393 Stress incontinence (female) (male): Secondary | ICD-10-CM

## 2020-08-15 DIAGNOSIS — G4733 Obstructive sleep apnea (adult) (pediatric): Secondary | ICD-10-CM

## 2020-08-15 DIAGNOSIS — M7501 Adhesive capsulitis of right shoulder: Secondary | ICD-10-CM

## 2020-08-15 DIAGNOSIS — E042 Nontoxic multinodular goiter: Secondary | ICD-10-CM

## 2020-08-15 MED ORDER — GLIMEPIRIDE 4 MG PO TABS: ORAL_TABLET | ORAL | 3 refills | Status: DC

## 2020-08-15 NOTE — Patient Instructions (Addendum)
Amanda Cooper , Thank you for taking time to come for your Medicare Wellness Visit. I appreciate your ongoing commitment to your health goals. Please review the following plan we discussed and let me know if I can assist you in the future.   These are the goals we discussed:  Goals      DIET - EAT MORE FRUITS AND VEGETABLES     Greens, berries, whole grains, beans, nuts/seeds      Exercise 150 min/wk Moderate Activity     HEMOGLOBIN A1C < 7     Weight (lb) < 160 lb (72.6 kg)        This is a list of the screening recommended for you and due dates:  Health Maintenance  Topic Date Due   COVID-19 Vaccine (1) Never done   Zoster (Shingles) Vaccine (1 of 2) Never done   Eye exam for diabetics  04/25/2020   DEXA scan (bone density measurement)  08/08/2020   Pneumonia vaccines (1 of 2 - PCV13) 08/08/2020   Flu Shot  09/09/2020   Hemoglobin A1C  10/26/2020   Colon Cancer Screening  02/09/2021   Tetanus Vaccine  02/22/2021   Mammogram  03/14/2021   Complete foot exam   04/25/2021   Hepatitis C Screening: USPSTF Recommendation to screen - Ages 18-79 yo.  Completed   HIV Screening  Completed   HPV Vaccine  Aged Out   Pap Smear  Discontinued     Ask insurance about shingrix - can get at pharmac-  2 shots 2-6 months apart     Zoster Vaccine, Recombinant injection What is this medication? ZOSTER VACCINE (ZOS ter vak SEEN) is a vaccine used to reduce the risk of getting shingles. This vaccine is not used to treat shingles or nerve pain fromshingles. This medicine may be used for other purposes; ask your health care provider orpharmacist if you have questions. COMMON BRAND NAME(S): [facility] What should I tell my care team before I take this medication? They need to know if you have any of these conditions: cancer immune system problems an unusual or allergic reaction to Zoster vaccine, other medications, foods, dyes, or preservatives pregnant or trying to get  pregnant breast-feeding How should I use this medication? This vaccine is injected into a muscle. It is given by a health care provider. A copy of Vaccine Information Statements will be given before each vaccination. Be sure to read this information carefully each time. This sheet may changeoften. Talk to your health care provider about the use of this vaccine in children.This vaccine is not approved for use in children. Overdosage: If you think you have taken too much of this medicine contact apoison control center or emergency room at once. NOTE: This medicine is only for you. Do not share this medicine with others. What if I miss a dose? Keep appointments for follow-up (booster) doses. It is important not to miss your dose. Call your health care provider if you are unable to keep anappointment. What may interact with this medication? medicines that suppress your immune system medicines to treat cancer steroid medicines like prednisone or cortisone This list may not describe all possible interactions. Give your health care provider a list of all the medicines, herbs, non-prescription drugs, or dietary supplements you use. Also tell them if you smoke, drink alcohol, or use illegaldrugs. Some items may interact with your medicine. What should I watch for while using this medication? Visit your health care provider regularly. This vaccine, like all vaccines,  may not fully protect everyone. What side effects may I notice from receiving this medication? Side effects that you should report to your doctor or health care professionalas soon as possible: allergic reactions (skin rash, itching or hives; swelling of the face, lips, or tongue) trouble breathing Side effects that usually do not require medical attention (report these toyour doctor or health care professional if they continue or are bothersome): chills headache fever nausea pain, redness, or irritation at site where  injected tiredness vomiting This list may not describe all possible side effects. Call your doctor for medical advice about side effects. You may report side effects to FDA at1-800-FDA-1088. Where should I keep my medication? This vaccine is only given by a health care provider. It will not be stored athome. NOTE: This sheet is a summary. It may not cover all possible information. If you have questions about this medicine, talk to your doctor, pharmacist, orhealth care provider.  2022 Elsevier/Gold Standard (2019-03-03 16:23:07)

## 2020-08-16 LAB — MAGNESIUM: Magnesium: 1.6 mg/dL (ref 1.5–2.5)

## 2020-08-16 LAB — COMPLETE METABOLIC PANEL WITH GFR
AG Ratio: 2.1 (calc) (ref 1.0–2.5)
ALT: 19 U/L (ref 6–29)
AST: 14 U/L (ref 10–35)
Albumin: 4.1 g/dL (ref 3.6–5.1)
Alkaline phosphatase (APISO): 115 U/L (ref 37–153)
BUN: 10 mg/dL (ref 7–25)
CO2: 26 mmol/L (ref 20–32)
Calcium: 9.7 mg/dL (ref 8.6–10.4)
Chloride: 103 mmol/L (ref 98–110)
Creat: 0.67 mg/dL (ref 0.50–0.99)
GFR, Est African American: 107 mL/min/{1.73_m2} (ref >=60)
GFR, Est Non African American: 92 mL/min/{1.73_m2} (ref >=60)
Globulin: 2 g/dL (calc) (ref 1.9–3.7)
Glucose, Bld: 200 mg/dL — ABNORMAL HIGH (ref 65–99)
Potassium: 4.1 mmol/L (ref 3.5–5.3)
Sodium: 140 mmol/L (ref 135–146)
Total Bilirubin: 0.3 mg/dL (ref 0.2–1.2)
Total Protein: 6.1 g/dL (ref 6.1–8.1)

## 2020-08-16 LAB — CBC WITH DIFFERENTIAL/PLATELET
Absolute Monocytes: 308 cells/uL (ref 200–950)
Basophils Absolute: 61 cells/uL (ref 0–200)
Basophils Relative: 1.1 %
Eosinophils Absolute: 402 cells/uL (ref 15–500)
Eosinophils Relative: 7.3 %
HCT: 41.5 % (ref 35.0–45.0)
Hemoglobin: 14 g/dL (ref 11.7–15.5)
Lymphs Abs: 1711 cells/uL (ref 850–3900)
MCH: 32 pg (ref 27.0–33.0)
MCHC: 33.7 g/dL (ref 32.0–36.0)
MCV: 95 fL (ref 80.0–100.0)
MPV: 11.3 fL (ref 7.5–12.5)
Monocytes Relative: 5.6 %
Neutro Abs: 3020 cells/uL (ref 1500–7800)
Neutrophils Relative %: 54.9 %
Platelets: 194 10*3/uL (ref 140–400)
RBC: 4.37 10*6/uL (ref 3.80–5.10)
RDW: 12.4 % (ref 11.0–15.0)
Total Lymphocyte: 31.1 %
WBC: 5.5 10*3/uL (ref 3.8–10.8)

## 2020-08-16 LAB — LIPID PANEL
Cholesterol: 153 mg/dL (ref <200)
HDL: 47 mg/dL — ABNORMAL LOW (ref >=50)
LDL Cholesterol (Calc): 70 mg/dL (calc)
Non-HDL Cholesterol (Calc): 106 mg/dL (calc) (ref <130)
Total CHOL/HDL Ratio: 3.3 (calc) (ref <5.0)
Triglycerides: 302 mg/dL — ABNORMAL HIGH (ref <150)

## 2020-08-16 LAB — HEMOGLOBIN A1C
Hgb A1c MFr Bld: 7.8 % of total Hgb — ABNORMAL HIGH (ref <5.7)
Mean Plasma Glucose: 177 mg/dL
eAG (mmol/L): 9.8 mmol/L

## 2020-08-16 LAB — TSH: TSH: 1.65 mIU/L (ref 0.40–4.50)

## 2020-08-19 ENCOUNTER — Other Ambulatory Visit: Payer: Self-pay | Admitting: Adult Health

## 2020-08-19 DIAGNOSIS — N390 Urinary tract infection, site not specified: Secondary | ICD-10-CM

## 2020-08-19 MED ORDER — SULFAMETHOXAZOLE-TRIMETHOPRIM 800-160 MG PO TABS: 1.0000 | ORAL_TABLET | Freq: Two times a day (BID) | ORAL | 0 refills | Status: DC

## 2020-08-20 DIAGNOSIS — M25612 Stiffness of left shoulder, not elsewhere classified: Secondary | ICD-10-CM | POA: Diagnosis not present

## 2020-08-20 DIAGNOSIS — R531 Weakness: Secondary | ICD-10-CM | POA: Diagnosis not present

## 2020-08-20 DIAGNOSIS — M25512 Pain in left shoulder: Secondary | ICD-10-CM | POA: Diagnosis not present

## 2020-08-20 DIAGNOSIS — M2580 Other specified joint disorders, unspecified joint: Secondary | ICD-10-CM | POA: Diagnosis not present

## 2020-08-22 DIAGNOSIS — M25612 Stiffness of left shoulder, not elsewhere classified: Secondary | ICD-10-CM | POA: Diagnosis not present

## 2020-08-22 DIAGNOSIS — I10 Essential (primary) hypertension: Secondary | ICD-10-CM | POA: Diagnosis not present

## 2020-08-22 DIAGNOSIS — M5412 Radiculopathy, cervical region: Secondary | ICD-10-CM | POA: Diagnosis not present

## 2020-08-22 DIAGNOSIS — M25512 Pain in left shoulder: Secondary | ICD-10-CM | POA: Diagnosis not present

## 2020-08-22 DIAGNOSIS — R531 Weakness: Secondary | ICD-10-CM | POA: Diagnosis not present

## 2020-08-22 DIAGNOSIS — M2580 Other specified joint disorders, unspecified joint: Secondary | ICD-10-CM | POA: Diagnosis not present

## 2020-08-27 DIAGNOSIS — G4733 Obstructive sleep apnea (adult) (pediatric): Secondary | ICD-10-CM | POA: Diagnosis not present

## 2020-08-29 DIAGNOSIS — M25512 Pain in left shoulder: Secondary | ICD-10-CM | POA: Diagnosis not present

## 2020-08-29 DIAGNOSIS — R531 Weakness: Secondary | ICD-10-CM | POA: Diagnosis not present

## 2020-08-29 DIAGNOSIS — M25612 Stiffness of left shoulder, not elsewhere classified: Secondary | ICD-10-CM | POA: Diagnosis not present

## 2020-08-29 DIAGNOSIS — M2580 Other specified joint disorders, unspecified joint: Secondary | ICD-10-CM | POA: Diagnosis not present

## 2020-09-02 IMAGING — CR DG CHEST 2V
2 series · 2 of 2 positions shown · non-contrast
Comparison: July 28, 2010.

CLINICAL DATA: Chest pain.

EXAM:
CHEST - 2 VIEW

[w chest pa]
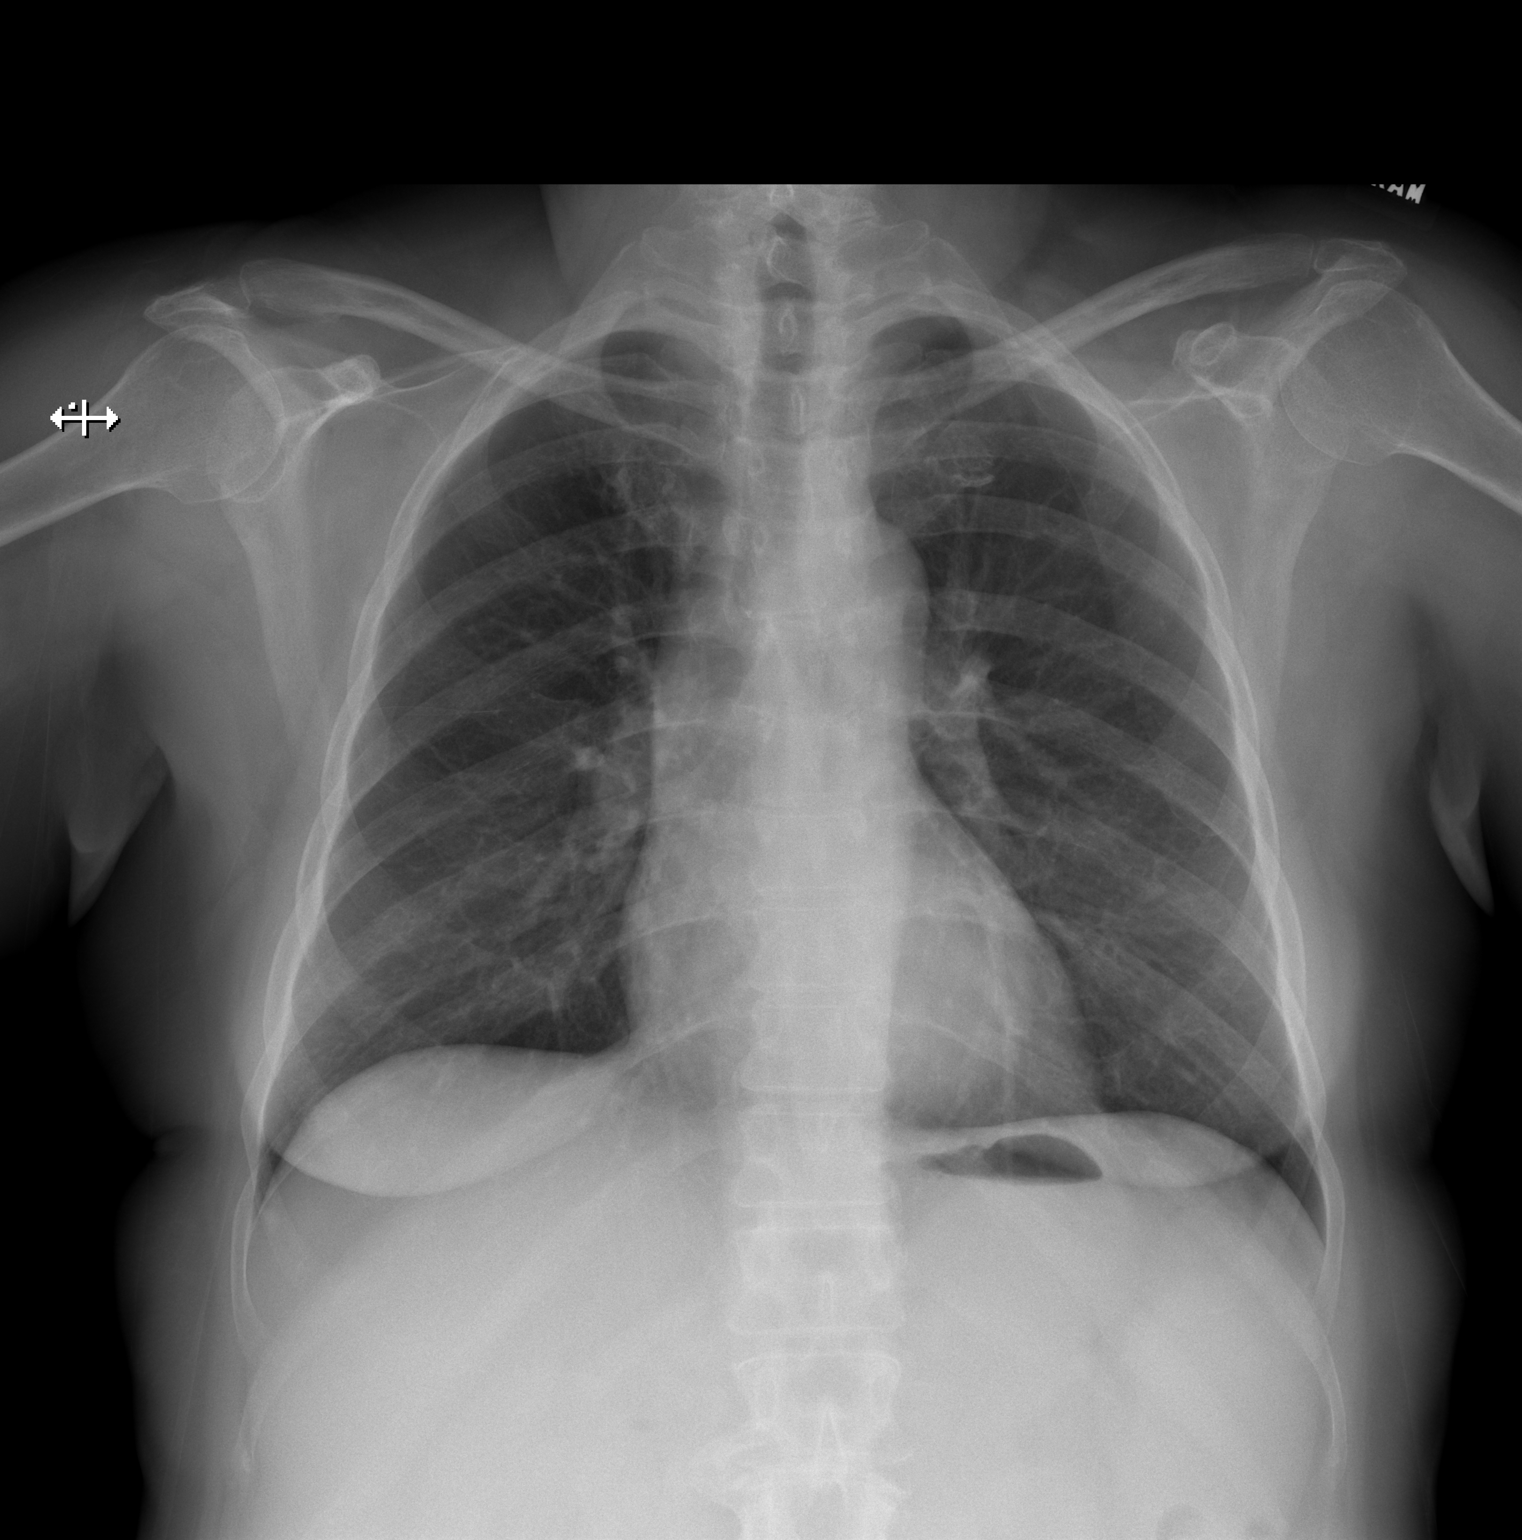

[w chest lat]
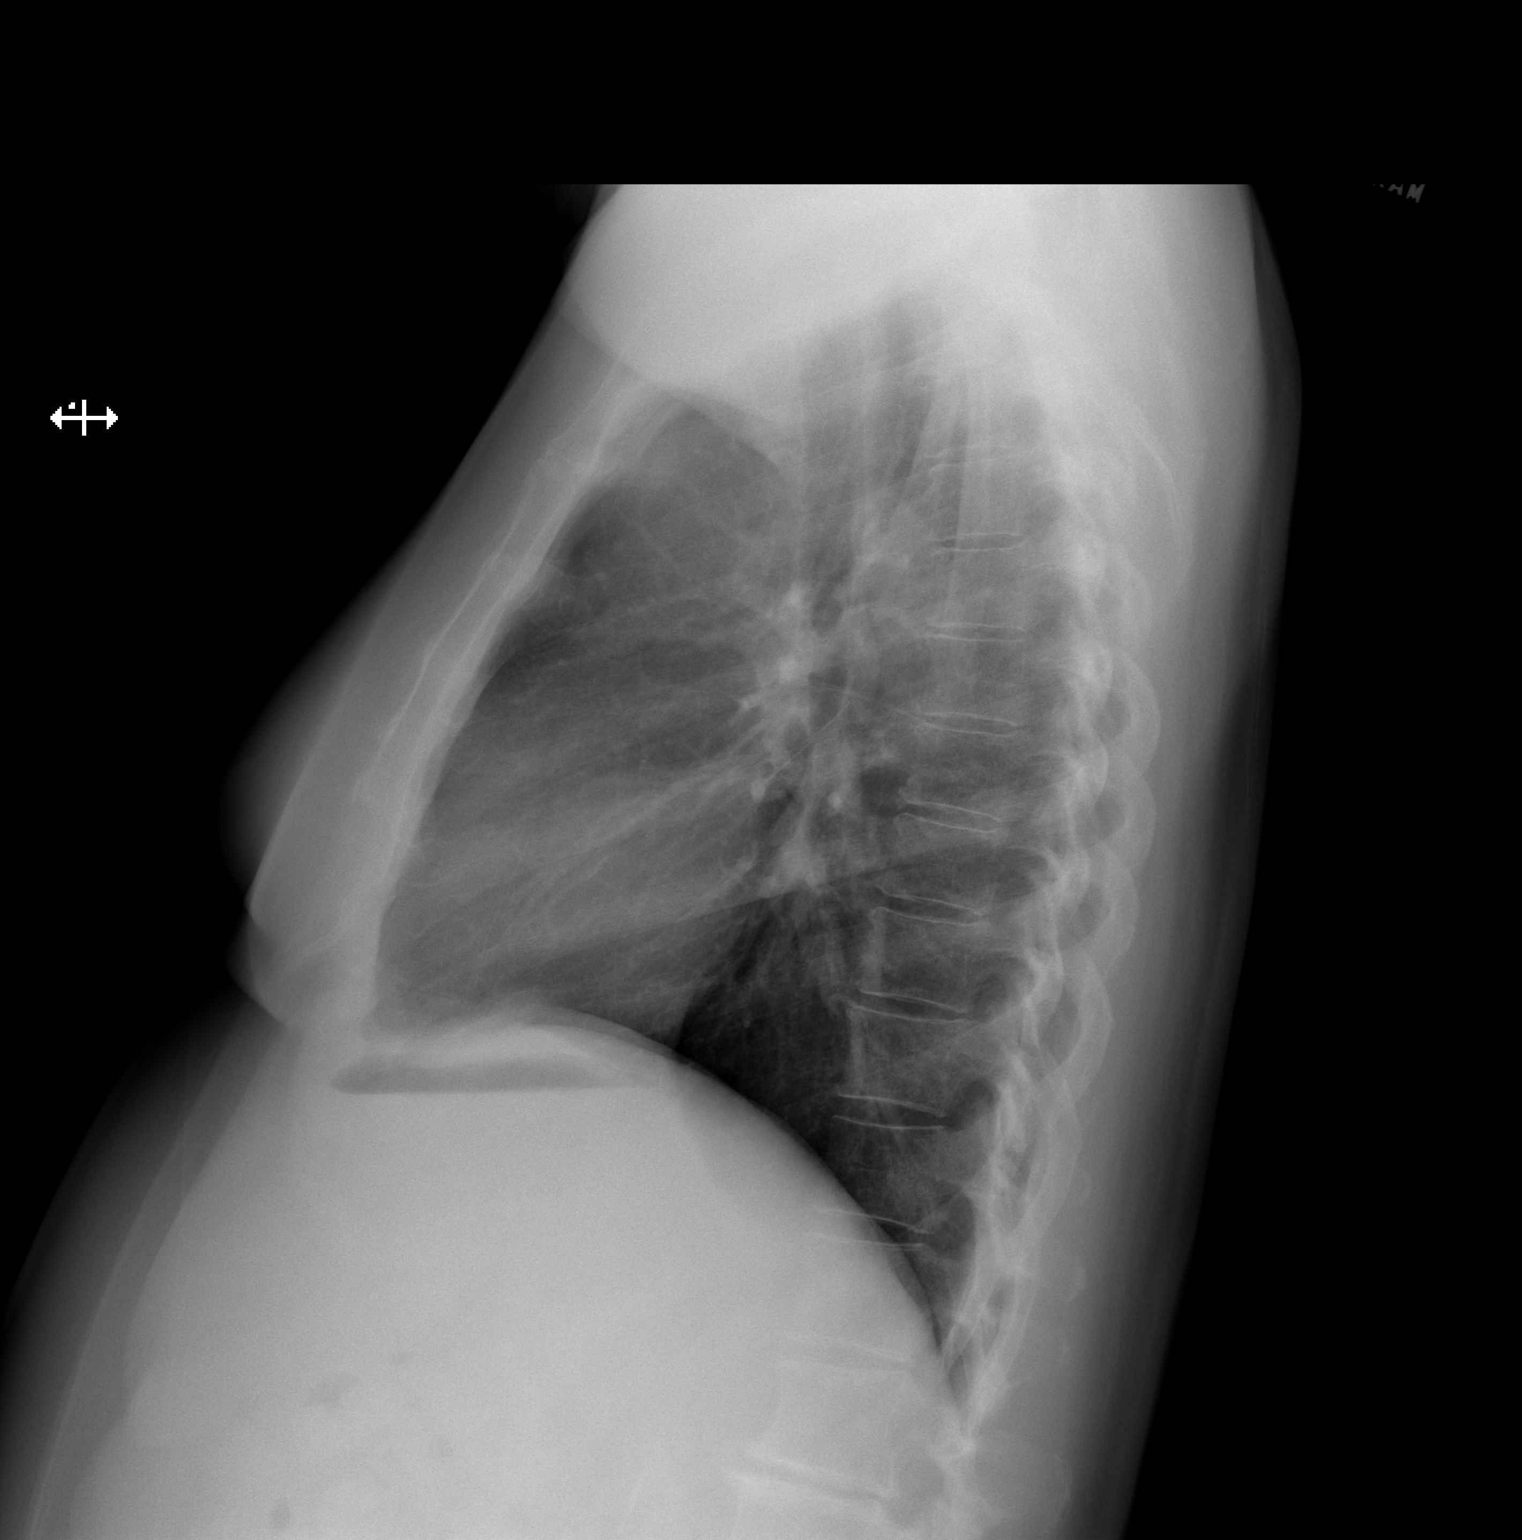

[2 of 2 positions shown; findings below may reference images not displayed]

FINDINGS: The heart size and mediastinal contours are within normal limits.
Both lungs are clear. No pneumothorax or pleural effusion is noted.
The visualized skeletal structures are unremarkable.
IMPRESSION: No active cardiopulmonary disease.

## 2020-09-11 ENCOUNTER — Other Ambulatory Visit: Payer: Self-pay | Admitting: Adult Health

## 2020-09-11 ENCOUNTER — Other Ambulatory Visit: Payer: Self-pay

## 2020-09-11 DIAGNOSIS — E119 Type 2 diabetes mellitus without complications: Secondary | ICD-10-CM

## 2020-09-11 DIAGNOSIS — G589 Mononeuropathy, unspecified: Secondary | ICD-10-CM

## 2020-09-11 MED ORDER — CELECOXIB 200 MG PO CAPS: ORAL_CAPSULE | ORAL | 0 refills | Status: DC

## 2020-09-11 MED ORDER — GLIMEPIRIDE 4 MG PO TABS: ORAL_TABLET | ORAL | 3 refills | Status: DC

## 2020-09-12 ENCOUNTER — Other Ambulatory Visit: Payer: Self-pay | Admitting: Internal Medicine

## 2020-09-12 DIAGNOSIS — R531 Weakness: Secondary | ICD-10-CM | POA: Diagnosis not present

## 2020-09-12 DIAGNOSIS — M25612 Stiffness of left shoulder, not elsewhere classified: Secondary | ICD-10-CM | POA: Diagnosis not present

## 2020-09-12 DIAGNOSIS — E119 Type 2 diabetes mellitus without complications: Secondary | ICD-10-CM

## 2020-09-12 DIAGNOSIS — M2580 Other specified joint disorders, unspecified joint: Secondary | ICD-10-CM | POA: Diagnosis not present

## 2020-09-12 DIAGNOSIS — M25512 Pain in left shoulder: Secondary | ICD-10-CM | POA: Diagnosis not present

## 2020-09-12 MED ORDER — GLIMEPIRIDE 4 MG PO TABS: ORAL_TABLET | ORAL | 3 refills | Status: AC

## 2020-10-01 DIAGNOSIS — M542 Cervicalgia: Secondary | ICD-10-CM | POA: Diagnosis not present

## 2020-10-04 DIAGNOSIS — H2511 Age-related nuclear cataract, right eye: Secondary | ICD-10-CM | POA: Diagnosis not present

## 2020-10-04 DIAGNOSIS — H40053 Ocular hypertension, bilateral: Secondary | ICD-10-CM | POA: Diagnosis not present

## 2020-10-04 DIAGNOSIS — E119 Type 2 diabetes mellitus without complications: Secondary | ICD-10-CM | POA: Diagnosis not present

## 2020-10-04 DIAGNOSIS — H40013 Open angle with borderline findings, low risk, bilateral: Secondary | ICD-10-CM | POA: Diagnosis not present

## 2020-10-16 ENCOUNTER — Other Ambulatory Visit: Payer: Self-pay | Admitting: Adult Health

## 2020-11-02 ENCOUNTER — Other Ambulatory Visit: Payer: Self-pay | Admitting: Internal Medicine

## 2020-11-02 DIAGNOSIS — M1A9XX0 Chronic gout, unspecified, without tophus (tophi): Secondary | ICD-10-CM

## 2020-11-03 ENCOUNTER — Other Ambulatory Visit: Payer: Self-pay | Admitting: Nurse Practitioner

## 2020-11-03 DIAGNOSIS — M1A9XX0 Chronic gout, unspecified, without tophus (tophi): Secondary | ICD-10-CM

## 2020-11-03 MED ORDER — ALLOPURINOL 100 MG PO TABS: ORAL_TABLET | ORAL | 3 refills | Status: AC

## 2020-11-08 ENCOUNTER — Other Ambulatory Visit: Payer: Self-pay | Admitting: Internal Medicine

## 2020-11-08 DIAGNOSIS — E669 Obesity, unspecified: Secondary | ICD-10-CM

## 2020-11-08 MED ORDER — PHENTERMINE HCL 37.5 MG PO TABS: ORAL_TABLET | ORAL | 1 refills | Status: DC

## 2020-11-09 ENCOUNTER — Other Ambulatory Visit: Payer: Self-pay | Admitting: Internal Medicine

## 2020-11-09 DIAGNOSIS — E669 Obesity, unspecified: Secondary | ICD-10-CM

## 2020-11-09 MED ORDER — PHENTERMINE HCL 37.5 MG PO TABS: ORAL_TABLET | ORAL | 1 refills | Status: AC

## 2020-11-18 ENCOUNTER — Ambulatory Visit: Payer: Medicare Other | Admitting: Adult Health

## 2020-11-19 ENCOUNTER — Ambulatory Visit: Payer: Medicare Other | Admitting: Nurse Practitioner

## 2020-11-19 ENCOUNTER — Ambulatory Visit: Payer: Medicare Other | Admitting: Adult Health

## 2020-12-03 DIAGNOSIS — M542 Cervicalgia: Secondary | ICD-10-CM | POA: Diagnosis not present

## 2020-12-05 DIAGNOSIS — H2511 Age-related nuclear cataract, right eye: Secondary | ICD-10-CM | POA: Diagnosis not present

## 2020-12-07 ENCOUNTER — Other Ambulatory Visit: Payer: Self-pay | Admitting: Internal Medicine

## 2020-12-09 DIAGNOSIS — Z79899 Other long term (current) drug therapy: Secondary | ICD-10-CM | POA: Diagnosis not present

## 2020-12-09 DIAGNOSIS — Z7984 Long term (current) use of oral hypoglycemic drugs: Secondary | ICD-10-CM | POA: Diagnosis not present

## 2020-12-09 DIAGNOSIS — E1141 Type 2 diabetes mellitus with diabetic mononeuropathy: Secondary | ICD-10-CM | POA: Diagnosis not present

## 2020-12-10 ENCOUNTER — Other Ambulatory Visit: Payer: Self-pay | Admitting: Adult Health

## 2020-12-10 DIAGNOSIS — G589 Mononeuropathy, unspecified: Secondary | ICD-10-CM

## 2020-12-10 DIAGNOSIS — M546 Pain in thoracic spine: Secondary | ICD-10-CM | POA: Diagnosis not present

## 2020-12-10 DIAGNOSIS — Z6830 Body mass index (BMI) 30.0-30.9, adult: Secondary | ICD-10-CM | POA: Diagnosis not present

## 2020-12-10 DIAGNOSIS — G8929 Other chronic pain: Secondary | ICD-10-CM | POA: Diagnosis not present

## 2020-12-10 MED ORDER — CELECOXIB 200 MG PO CAPS: ORAL_CAPSULE | ORAL | 0 refills | Status: AC

## 2020-12-30 DIAGNOSIS — M5414 Radiculopathy, thoracic region: Secondary | ICD-10-CM | POA: Diagnosis not present

## 2021-01-21 DIAGNOSIS — M5414 Radiculopathy, thoracic region: Secondary | ICD-10-CM | POA: Diagnosis not present

## 2021-01-29 DIAGNOSIS — M2578 Osteophyte, vertebrae: Secondary | ICD-10-CM | POA: Diagnosis not present

## 2021-01-29 DIAGNOSIS — M5414 Radiculopathy, thoracic region: Secondary | ICD-10-CM | POA: Diagnosis not present

## 2021-02-05 DIAGNOSIS — H2512 Age-related nuclear cataract, left eye: Secondary | ICD-10-CM | POA: Diagnosis not present

## 2021-02-06 DIAGNOSIS — H2512 Age-related nuclear cataract, left eye: Secondary | ICD-10-CM | POA: Diagnosis not present

## 2021-02-11 DIAGNOSIS — M5414 Radiculopathy, thoracic region: Secondary | ICD-10-CM | POA: Diagnosis not present

## 2021-03-07 ENCOUNTER — Other Ambulatory Visit: Payer: Self-pay

## 2021-03-07 MED ORDER — LOSARTAN POTASSIUM 50 MG PO TABS: 50.0000 mg | ORAL_TABLET | Freq: Every day | ORAL | 1 refills | Status: AC

## 2021-03-11 DIAGNOSIS — M5414 Radiculopathy, thoracic region: Secondary | ICD-10-CM | POA: Diagnosis not present

## 2021-03-12 DIAGNOSIS — Z7984 Long term (current) use of oral hypoglycemic drugs: Secondary | ICD-10-CM | POA: Diagnosis not present

## 2021-03-12 DIAGNOSIS — E1141 Type 2 diabetes mellitus with diabetic mononeuropathy: Secondary | ICD-10-CM | POA: Diagnosis not present

## 2021-03-20 DIAGNOSIS — Z1231 Encounter for screening mammogram for malignant neoplasm of breast: Secondary | ICD-10-CM | POA: Diagnosis not present

## 2021-03-31 ENCOUNTER — Other Ambulatory Visit: Payer: Self-pay | Admitting: Adult Health

## 2021-04-03 ENCOUNTER — Other Ambulatory Visit (HOSPITAL_BASED_OUTPATIENT_CLINIC_OR_DEPARTMENT_OTHER): Payer: Self-pay

## 2021-04-03 MED ORDER — TRULICITY 3 MG/0.5ML ~~LOC~~ SOAJ
SUBCUTANEOUS | 1 refills | Status: AC
  Filled 2021-04-03: qty 2, 28d supply, fill #0

## 2021-04-04 ENCOUNTER — Other Ambulatory Visit (HOSPITAL_BASED_OUTPATIENT_CLINIC_OR_DEPARTMENT_OTHER): Payer: Self-pay

## 2021-04-07 ENCOUNTER — Other Ambulatory Visit (HOSPITAL_BASED_OUTPATIENT_CLINIC_OR_DEPARTMENT_OTHER): Payer: Self-pay

## 2021-04-08 DIAGNOSIS — M5414 Radiculopathy, thoracic region: Secondary | ICD-10-CM | POA: Diagnosis not present

## 2021-04-25 ENCOUNTER — Encounter: Payer: Self-pay | Admitting: Adult Health

## 2021-05-01 DIAGNOSIS — M5414 Radiculopathy, thoracic region: Secondary | ICD-10-CM | POA: Diagnosis not present

## 2021-05-06 DIAGNOSIS — Z1211 Encounter for screening for malignant neoplasm of colon: Secondary | ICD-10-CM | POA: Diagnosis not present

## 2021-05-06 DIAGNOSIS — Z86010 Personal history of colonic polyps: Secondary | ICD-10-CM | POA: Diagnosis not present

## 2021-05-27 DIAGNOSIS — M5414 Radiculopathy, thoracic region: Secondary | ICD-10-CM | POA: Diagnosis not present

## 2021-06-03 IMAGING — US US THYROID
4 series · 12 of 25 positions shown · non-contrast
Comparison: None.

CLINICAL DATA: Incidental on MRI. Thyroid nodules apparently
identified on recent outside MRI of the cervical spine.

EXAM:
THYROID ULTRASOUND
TECHNIQUE: Ultrasound examination of the thyroid gland and adjacent soft
tissues was performed.

[Series 1: us thyroid · 0.08mm/px · 4 of 68 slices shown (1 of 4)]
[im 9/68]
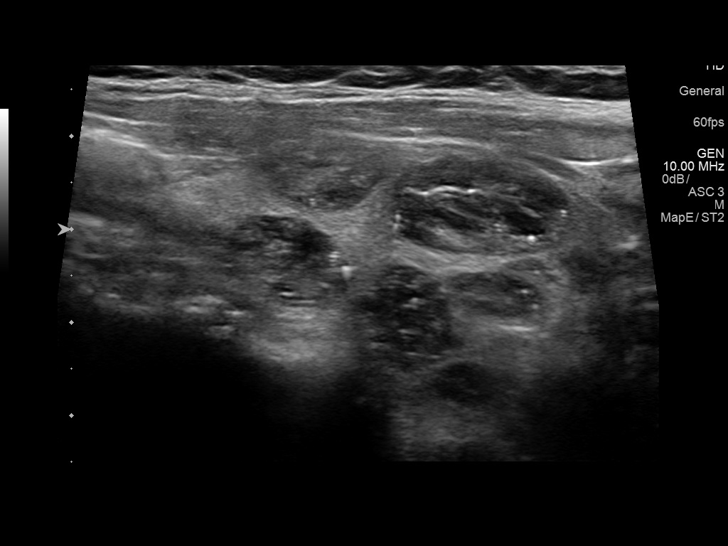
[im 26/68]
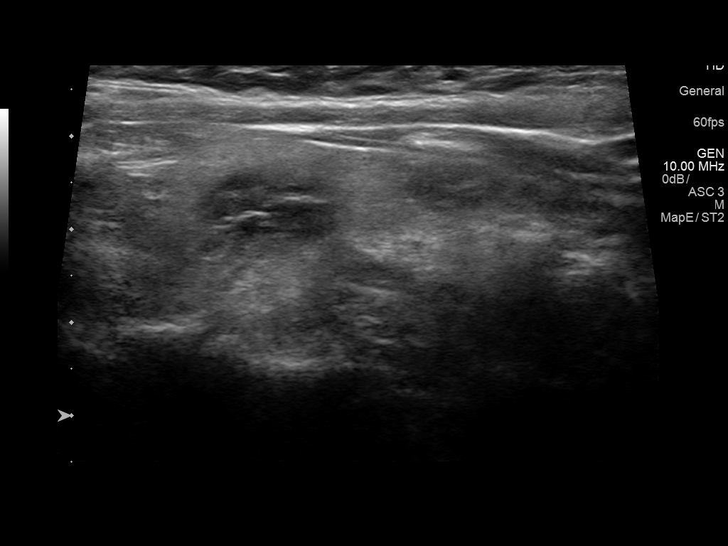
[im 42/68]
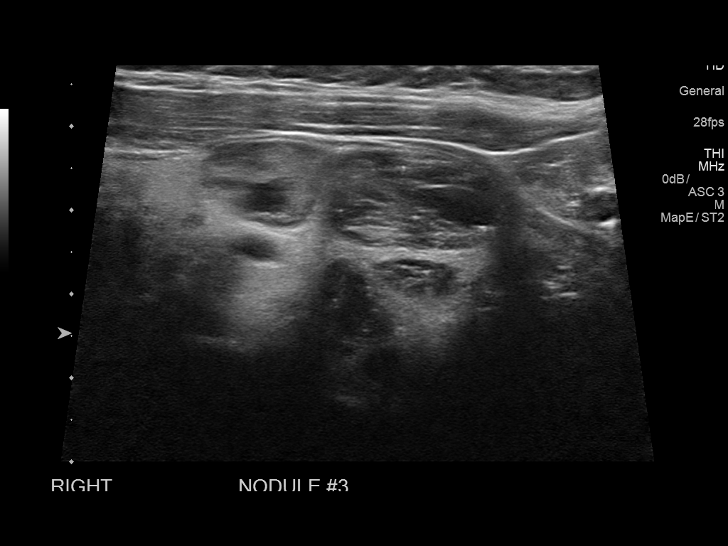
[im 59/68]
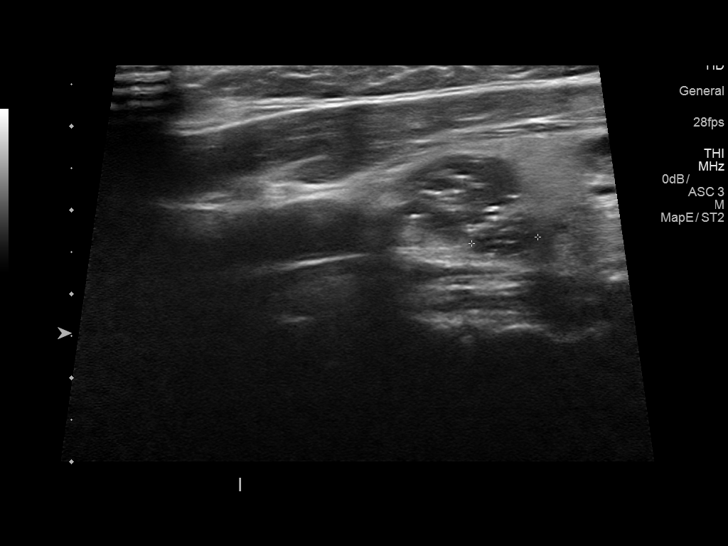

[Series 1: us thyroid · 0.08mm/px · 4 of 53 slices shown (2 of 4)]
[im 1/53]
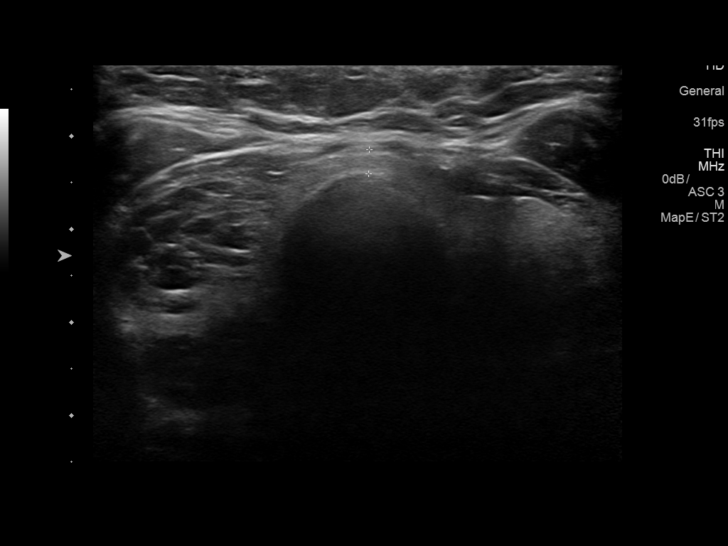
[im 18/53]
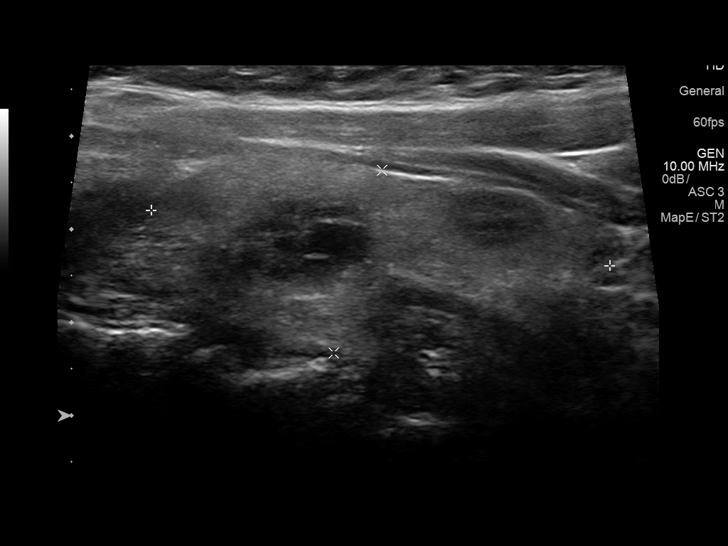
[im 35/53]
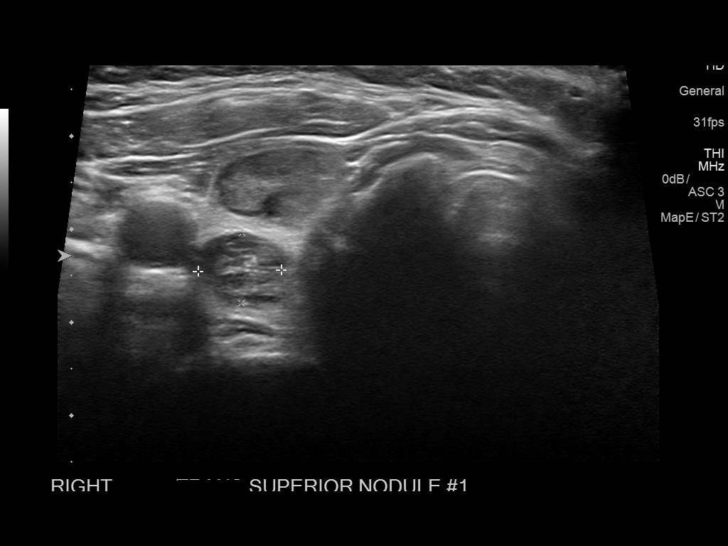
[im 53/53]
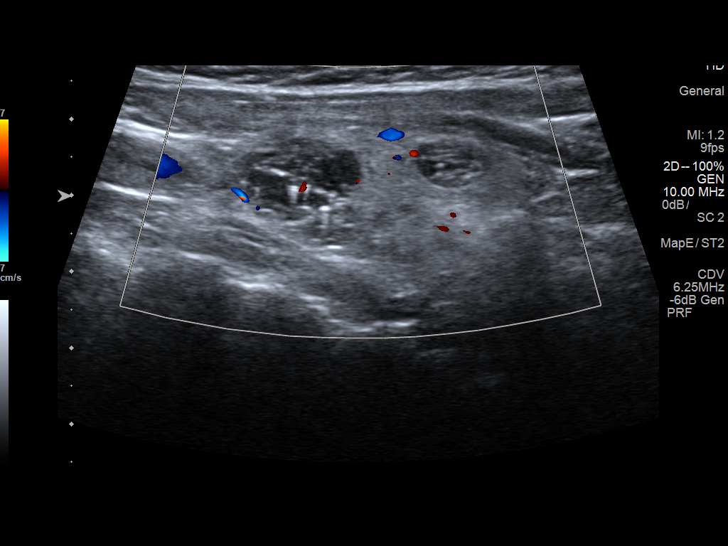

[Series 1: us thyroid · 0.08mm/px · 3 of 52 slices shown (3 of 4)]
[im 9/52]
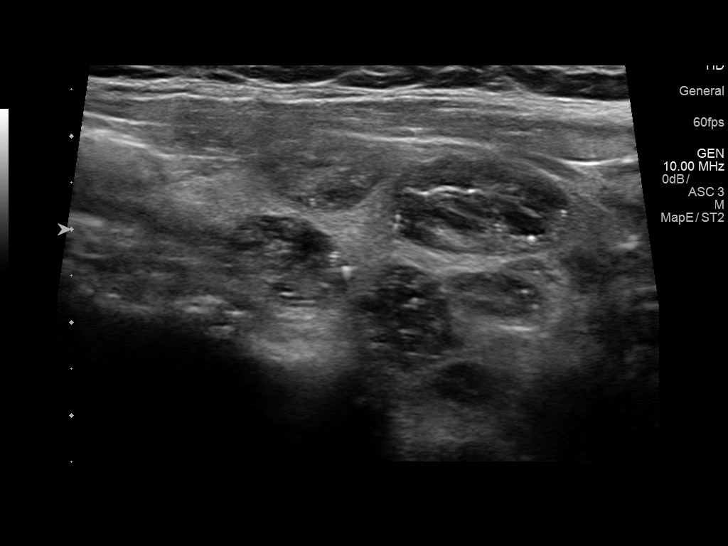
[im 26/52]
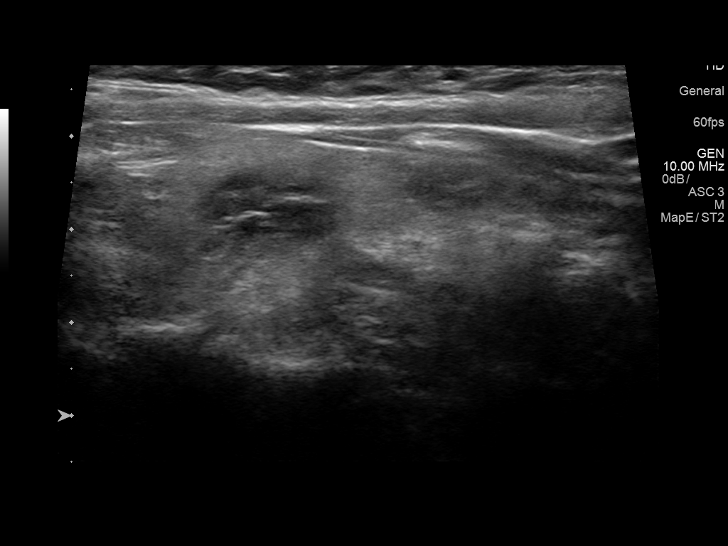
[im 43/52]
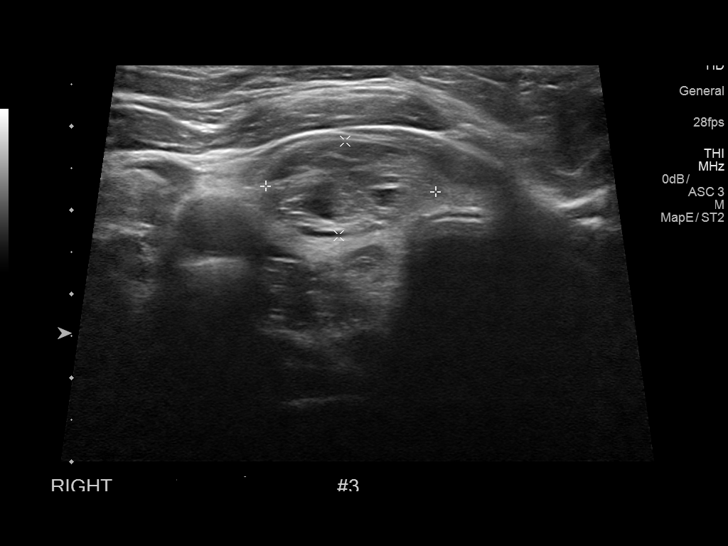

[Series 2001: us thyroid · 0.08mm/px · 1 of 14 slices shown (4 of 4)]
[im 1/14]
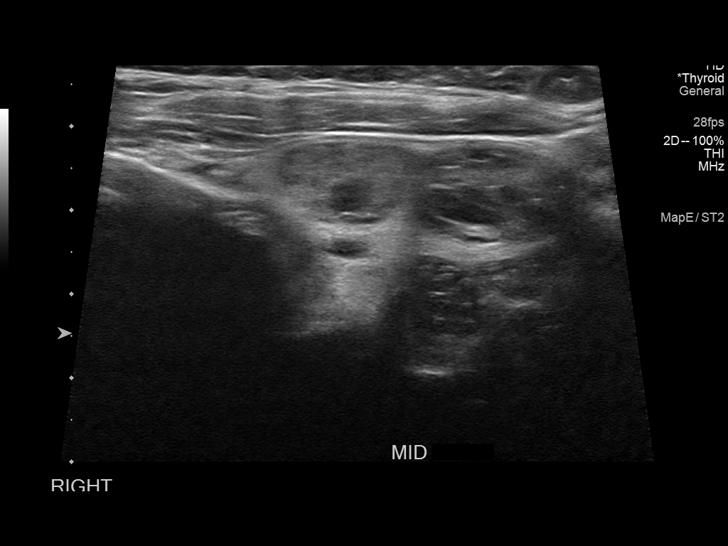

[12 of 25 positions shown; findings below may reference images not displayed]

FINDINGS: Parenchymal Echotexture: Mildly heterogenous

Isthmus: 0.3 cm

Right lobe: 5.4 x 2.8 x 2.2 cm

Left lobe: 5.0 x 2.0 x 1.8 cm

_________________________________________________________

Estimated total number of nodules >/= 1 cm: 6-10

Number of spongiform nodules >/=  2 cm not described below (TR1): 0

Number of mixed cystic and solid nodules >/= 1.5 cm not described
below (TR2): 0

_________________________________________________________

Nodule # 1:

Location: Right; Superior

Maximum size: 1.4 cm; Other 2 dimensions: 0.9 x 0.8 cm

Composition: spongiform (0)

Echogenicity: anechoic (0)

Shape: not taller-than-wide (0)

Margins: smooth (0)

Echogenic foci: none (0)

ACR TI-RADS total points: 0.

ACR TI-RADS risk category: TR1 (0-1 points).

ACR TI-RADS recommendations:

This nodule does NOT meet TI-RADS criteria for biopsy or dedicated
follow-up.

_________________________________________________________

Nodule # 2:

Location: Right; Superior

Maximum size: 1.6 cm; Other 2 dimensions: 1.4 x 1.0 cm

Composition: mixed cystic and solid (1)

Echogenicity: hypoechoic (2)

Shape: not taller-than-wide (0)

Margins: smooth (0)

Echogenic foci: none (0)

ACR TI-RADS total points: 3.

ACR TI-RADS risk category: TR3 (3 points).

ACR TI-RADS recommendations:

*Given size (>/= 1.5 - 2.4 cm) and appearance, a follow-up
ultrasound in 1 year should be considered based on TI-RADS criteria.

_________________________________________________________

Nodule # 3:

Location: Right; Mid

Maximum size: 2.1 cm; Other 2 dimensions: 2.0 x 1.1 cm

Composition: spongiform (0)

Echogenicity: anechoic (0)

Shape: not taller-than-wide (0)

Margins: smooth (0)

Echogenic foci: none (0)

ACR TI-RADS total points: 0.

ACR TI-RADS risk category: TR1 (0-1 points).

ACR TI-RADS recommendations:

This nodule does NOT meet TI-RADS criteria for biopsy or dedicated
follow-up.

_________________________________________________________

Nodule # 5:

Location: Left; Superior

Maximum size: 1.8 cm; Other 2 dimensions: 1.1 x 1.2 cm

Composition: spongiform (0)

Echogenicity: anechoic (0)

Shape: not taller-than-wide (0)

Margins: smooth (0)

Echogenic foci: none (0)

ACR TI-RADS total points: 0.

ACR TI-RADS risk category: TR1 (0-1 points).

ACR TI-RADS recommendations:

This nodule does NOT meet TI-RADS criteria for biopsy or dedicated
follow-up.

_________________________________________________________

The 4 largest nodules are characterized as per TI-RADS
recommendations. All of the other nodules are clearly similarly
spongiform in appearance and appear benign. No abnormal lymph nodes
are identified by ultrasound.
IMPRESSION: Multinodular thyroid goiter with nodules as detailed above. Only a
single nodule meets criteria for follow-up. Nodule 2, a 1.6 cm right
superior thyroid nodule meets criteria for 1 year follow-up
ultrasound. Other nodules do not meet criteria for biopsy or
dedicated follow-up.

The above is in keeping with the ACR TI-RADS recommendations - [HOSPITAL] 6545;[DATE].

## 2021-06-06 DIAGNOSIS — R799 Abnormal finding of blood chemistry, unspecified: Secondary | ICD-10-CM | POA: Diagnosis not present

## 2021-06-06 DIAGNOSIS — Z20822 Contact with and (suspected) exposure to covid-19: Secondary | ICD-10-CM | POA: Diagnosis not present

## 2021-07-22 DIAGNOSIS — Z0000 Encounter for general adult medical examination without abnormal findings: Secondary | ICD-10-CM | POA: Diagnosis not present

## 2021-07-22 DIAGNOSIS — E119 Type 2 diabetes mellitus without complications: Secondary | ICD-10-CM | POA: Diagnosis not present

## 2021-08-15 ENCOUNTER — Ambulatory Visit: Payer: Self-pay | Admitting: Adult Health

## 2021-09-12 ENCOUNTER — Other Ambulatory Visit: Payer: Self-pay | Admitting: Internal Medicine

## 2021-09-24 DIAGNOSIS — Z79899 Other long term (current) drug therapy: Secondary | ICD-10-CM | POA: Diagnosis not present

## 2021-09-24 DIAGNOSIS — Z0000 Encounter for general adult medical examination without abnormal findings: Secondary | ICD-10-CM | POA: Diagnosis not present

## 2021-09-24 DIAGNOSIS — E119 Type 2 diabetes mellitus without complications: Secondary | ICD-10-CM | POA: Diagnosis not present

## 2021-09-30 DIAGNOSIS — M5414 Radiculopathy, thoracic region: Secondary | ICD-10-CM | POA: Diagnosis not present

## 2021-10-22 ENCOUNTER — Telehealth: Payer: Self-pay

## 2021-10-22 NOTE — Patient Outreach (Signed)
  Care Coordination   Initial Visit Note   10/22/2021 Name: Amanda Cooper MRN: [id number] DOB: 08-Aug-1955  Amanda Cooper is a 66 y.o. year old female who sees Amanda Cooper, [place] for primary care. I spoke with  Amanda Cooper by phone today.  What matters to the patients health and wellness today?  None Patient has switched providers to Amanda Cooper, [place] with Amanda Cooper [facility]    Goals Addressed   None     SDOH assessments and interventions completed:  No     Care Coordination Interventions Activated:  No  Care Coordination Interventions:  No, not indicated   Follow up plan: No further intervention required.   Encounter Outcome:  Pt. Visit Completed   Jone Baseman, RN, MSN [place] Care Management Coordinator Direct Line [phone]

## 2021-10-29 DIAGNOSIS — Z6831 Body mass index (BMI) 31.0-31.9, adult: Secondary | ICD-10-CM | POA: Diagnosis not present

## 2021-10-29 DIAGNOSIS — M5414 Radiculopathy, thoracic region: Secondary | ICD-10-CM | POA: Diagnosis not present

## 2021-11-05 DIAGNOSIS — E11319 Type 2 diabetes mellitus with unspecified diabetic retinopathy without macular edema: Secondary | ICD-10-CM | POA: Diagnosis not present

## 2021-12-05 DIAGNOSIS — E1165 Type 2 diabetes mellitus with hyperglycemia: Secondary | ICD-10-CM | POA: Diagnosis not present

## 2022-01-06 DIAGNOSIS — M5414 Radiculopathy, thoracic region: Secondary | ICD-10-CM | POA: Diagnosis not present

## 2022-01-24 ENCOUNTER — Other Ambulatory Visit: Payer: Self-pay | Admitting: Nurse Practitioner

## 2022-01-24 DIAGNOSIS — M1A9XX0 Chronic gout, unspecified, without tophus (tophi): Secondary | ICD-10-CM

## 2022-04-09 DIAGNOSIS — M542 Cervicalgia: Secondary | ICD-10-CM | POA: Diagnosis not present

## 2022-04-09 DIAGNOSIS — M546 Pain in thoracic spine: Secondary | ICD-10-CM | POA: Diagnosis not present

## 2022-04-17 DIAGNOSIS — M5414 Radiculopathy, thoracic region: Secondary | ICD-10-CM | POA: Diagnosis not present

## 2022-04-20 DIAGNOSIS — M65312 Trigger thumb, left thumb: Secondary | ICD-10-CM | POA: Diagnosis not present
